# Patient Record
Sex: Male | Born: 1935 | Race: White | Hispanic: No | Marital: Married | Smoking: Former smoker
Health system: Southern US, Community
[De-identification: ages and names within clinical notes are randomized; demographics above are authoritative.]

## PROBLEM LIST (undated history)

## (undated) DIAGNOSIS — I10 Essential (primary) hypertension: Secondary | ICD-10-CM

## (undated) DIAGNOSIS — K5792 Diverticulitis of intestine, part unspecified, without perforation or abscess without bleeding: Secondary | ICD-10-CM

## (undated) DIAGNOSIS — IMO0002 Reserved for concepts with insufficient information to code with codable children: Secondary | ICD-10-CM

## (undated) DIAGNOSIS — E785 Hyperlipidemia, unspecified: Secondary | ICD-10-CM

## (undated) DIAGNOSIS — E119 Type 2 diabetes mellitus without complications: Secondary | ICD-10-CM

---

## 2000-10-25 ENCOUNTER — Encounter: Admission: RE | Admit: 2000-10-25 | Discharge: 2001-01-23 | Payer: Self-pay | Admitting: Internal Medicine

## 2001-10-24 ENCOUNTER — Encounter: Admission: RE | Admit: 2001-10-24 | Discharge: 2001-10-29 | Payer: Self-pay | Admitting: Internal Medicine

## 2002-03-18 ENCOUNTER — Encounter (HOSPITAL_BASED_OUTPATIENT_CLINIC_OR_DEPARTMENT_OTHER): Admission: RE | Admit: 2002-03-18 | Discharge: 2002-03-21 | Payer: Self-pay | Admitting: Internal Medicine

## 2002-07-01 ENCOUNTER — Encounter (HOSPITAL_BASED_OUTPATIENT_CLINIC_OR_DEPARTMENT_OTHER): Admission: RE | Admit: 2002-07-01 | Discharge: 2002-09-29 | Payer: Self-pay | Admitting: Internal Medicine

## 2002-10-03 ENCOUNTER — Encounter (HOSPITAL_BASED_OUTPATIENT_CLINIC_OR_DEPARTMENT_OTHER): Admission: RE | Admit: 2002-10-03 | Discharge: 2003-01-01 | Payer: Self-pay | Admitting: Internal Medicine

## 2002-12-06 ENCOUNTER — Encounter: Payer: Self-pay | Admitting: Internal Medicine

## 2002-12-06 ENCOUNTER — Encounter: Admission: RE | Admit: 2002-12-06 | Discharge: 2002-12-06 | Payer: Self-pay | Admitting: Internal Medicine

## 2003-04-04 ENCOUNTER — Encounter (HOSPITAL_BASED_OUTPATIENT_CLINIC_OR_DEPARTMENT_OTHER): Admission: RE | Admit: 2003-04-04 | Discharge: 2003-07-03 | Payer: Self-pay | Admitting: Internal Medicine

## 2003-07-24 ENCOUNTER — Encounter (HOSPITAL_BASED_OUTPATIENT_CLINIC_OR_DEPARTMENT_OTHER): Admission: RE | Admit: 2003-07-24 | Discharge: 2003-08-06 | Payer: Self-pay | Admitting: Internal Medicine

## 2003-10-30 ENCOUNTER — Encounter (HOSPITAL_BASED_OUTPATIENT_CLINIC_OR_DEPARTMENT_OTHER): Admission: RE | Admit: 2003-10-30 | Discharge: 2003-12-02 | Payer: Self-pay | Admitting: Internal Medicine

## 2004-02-18 ENCOUNTER — Encounter (HOSPITAL_BASED_OUTPATIENT_CLINIC_OR_DEPARTMENT_OTHER): Admission: RE | Admit: 2004-02-18 | Discharge: 2004-02-27 | Payer: Self-pay | Admitting: Internal Medicine

## 2004-05-26 ENCOUNTER — Encounter (HOSPITAL_BASED_OUTPATIENT_CLINIC_OR_DEPARTMENT_OTHER): Admission: RE | Admit: 2004-05-26 | Discharge: 2004-06-09 | Payer: Self-pay | Admitting: Internal Medicine

## 2004-09-07 ENCOUNTER — Encounter (HOSPITAL_BASED_OUTPATIENT_CLINIC_OR_DEPARTMENT_OTHER): Admission: RE | Admit: 2004-09-07 | Discharge: 2004-09-27 | Payer: Self-pay | Admitting: Internal Medicine

## 2005-05-22 ENCOUNTER — Inpatient Hospital Stay (HOSPITAL_COMMUNITY): Admission: EM | Admit: 2005-05-22 | Discharge: 2005-05-25 | Payer: Self-pay | Admitting: Emergency Medicine

## 2009-02-05 ENCOUNTER — Ambulatory Visit: Payer: Self-pay | Admitting: Vascular Surgery

## 2009-02-05 ENCOUNTER — Ambulatory Visit: Admission: RE | Admit: 2009-02-05 | Discharge: 2009-02-05 | Payer: Self-pay | Admitting: Internal Medicine

## 2009-02-05 ENCOUNTER — Encounter (INDEPENDENT_AMBULATORY_CARE_PROVIDER_SITE_OTHER): Payer: Self-pay | Admitting: Internal Medicine

## 2010-10-10 ENCOUNTER — Inpatient Hospital Stay (HOSPITAL_COMMUNITY)
Admission: EM | Admit: 2010-10-10 | Discharge: 2010-10-12 | DRG: 379 | Disposition: A | Discharge status: Home / Self Care | Payer: Medicare Other | Attending: General Practice | Admitting: General Practice

## 2010-10-10 DIAGNOSIS — E119 Type 2 diabetes mellitus without complications: Secondary | ICD-10-CM | POA: Diagnosis present

## 2010-10-10 DIAGNOSIS — E663 Overweight: Secondary | ICD-10-CM | POA: Diagnosis present

## 2010-10-10 DIAGNOSIS — E7800 Pure hypercholesterolemia, unspecified: Secondary | ICD-10-CM | POA: Diagnosis present

## 2010-10-10 DIAGNOSIS — IMO0002 Reserved for concepts with insufficient information to code with codable children: Secondary | ICD-10-CM | POA: Diagnosis present

## 2010-10-10 DIAGNOSIS — H409 Unspecified glaucoma: Secondary | ICD-10-CM | POA: Diagnosis present

## 2010-10-10 DIAGNOSIS — I10 Essential (primary) hypertension: Secondary | ICD-10-CM | POA: Diagnosis present

## 2010-10-10 DIAGNOSIS — K5731 Diverticulosis of large intestine without perforation or abscess with bleeding: Principal | ICD-10-CM | POA: Diagnosis present

## 2010-10-10 LAB — COMPREHENSIVE METABOLIC PANEL
Albumin: 3.3 g/dL — ABNORMAL LOW (ref 3.5–5.2)
Alkaline Phosphatase: 69 U/L (ref 39–117)
BUN: 20 mg/dL (ref 6–23)
CO2: 24 mEq/L (ref 19–32)
Chloride: 105 mEq/L (ref 96–112)
GFR calc non Af Amer: 54 mL/min — ABNORMAL LOW (ref >60)
Glucose, Bld: 121 mg/dL — ABNORMAL HIGH (ref 70–99)
Potassium: 4.6 mEq/L (ref 3.5–5.1)
Total Bilirubin: 0.2 mg/dL — ABNORMAL LOW (ref 0.3–1.2)

## 2010-10-10 LAB — CBC
HCT: 40.8 % (ref 39.0–52.0)
Hemoglobin: 13.4 g/dL (ref 13.0–17.0)
MCV: 95.8 fL (ref 78.0–100.0)
Platelets: 187 10*3/uL (ref 150–400)
RBC: 4.26 MIL/uL (ref 4.22–5.81)
WBC: 10.6 10*3/uL — ABNORMAL HIGH (ref 4.0–10.5)

## 2010-10-10 LAB — DIFFERENTIAL
Basophils Relative: 0 % (ref 0–1)
Eosinophils Relative: 2 % (ref 0–5)
Lymphocytes Relative: 26 % (ref 12–46)
Monocytes Relative: 9 % (ref 3–12)
Neutro Abs: 6.6 10*3/uL (ref 1.7–7.7)
Neutrophils Relative %: 63 % (ref 43–77)

## 2010-10-10 LAB — APTT: aPTT: 37 seconds (ref 24–37)

## 2010-10-10 LAB — TYPE AND SCREEN: Antibody Screen: NEGATIVE

## 2010-10-10 LAB — PROTIME-INR: Prothrombin Time: 13.3 seconds (ref 11.6–15.2)

## 2010-10-11 ENCOUNTER — Emergency Department (HOSPITAL_COMMUNITY): Payer: Medicare Other

## 2010-10-11 LAB — GLUCOSE, CAPILLARY
Glucose-Capillary: 101 mg/dL — ABNORMAL HIGH (ref 70–99)
Glucose-Capillary: 93 mg/dL (ref 70–99)

## 2010-10-11 LAB — HEMOGLOBIN AND HEMATOCRIT, BLOOD
HCT: 35.4 % — ABNORMAL LOW (ref 39.0–52.0)
Hemoglobin: 11.9 g/dL — ABNORMAL LOW (ref 13.0–17.0)

## 2010-10-11 LAB — CBC
HCT: 36.4 % — ABNORMAL LOW (ref 39.0–52.0)
Hemoglobin: 12.1 g/dL — ABNORMAL LOW (ref 13.0–17.0)
MCV: 97.6 fL (ref 78.0–100.0)
RDW: 12.5 % (ref 11.5–15.5)
WBC: 8 10*3/uL (ref 4.0–10.5)

## 2010-10-11 LAB — BASIC METABOLIC PANEL
BUN: 17 mg/dL (ref 6–23)
CO2: 25 mEq/L (ref 19–32)
Chloride: 108 mEq/L (ref 96–112)
GFR calc non Af Amer: >60 mL/min (ref >60)
Glucose, Bld: 104 mg/dL — ABNORMAL HIGH (ref 70–99)
Potassium: 4.4 mEq/L (ref 3.5–5.1)

## 2010-10-11 LAB — ABO/RH: ABO/RH(D): O POS

## 2010-10-12 LAB — HEMOGLOBIN AND HEMATOCRIT, BLOOD
HCT: 32.8 % — ABNORMAL LOW (ref 39.0–52.0)
Hemoglobin: 11 g/dL — ABNORMAL LOW (ref 13.0–17.0)

## 2010-10-12 LAB — GLUCOSE, CAPILLARY
Glucose-Capillary: 87 mg/dL (ref 70–99)
Glucose-Capillary: 93 mg/dL (ref 70–99)
Glucose-Capillary: 141 mg/dL — ABNORMAL HIGH (ref 70–99)

## 2010-10-27 NOTE — Discharge Summary (Addendum)
  NAME:  French, Chad NO.:  [id number]  MEDICAL RECORD NO.:  [id number]           PATIENT TYPE:  I  LOCATION:  4741                         FACILITY:  [facility]  PHYSICIAN:  Deirdre Peer. Zaeda Mcferran, M.D. DATE OF BIRTH:  Aug 10, 1922  DATE OF ADMISSION:  10/10/2010 DATE OF DISCHARGE:  10/12/2010                              DISCHARGE SUMMARY   DISCHARGE DIAGNOSES: 1. Lower gastrointestinal bleed presumed secondary to diverticulosis,     bleeding resolved of conservative measures including bowel rest, IV     fluids.  Discharge hemoglobin 11, admitting hemoglobin 12.  The     patient's drop in hemoglobin felt to be secondary to blood loss as     well as dilutional effect of IV fluids.  The patient was discharged     in stable condition, alert, and oriented tolerating full p.o. 2. Diabetes. 3. Glaucoma. 4. Known history of diverticulosis with gastrointestinal bleeding in     2006. 5. Hypercholesterolemia. 6. Overweight.  DISCHARGE MEDICATIONS:  The patient will resume home medications. Please see medical record for further medication doses.  DISPOSITION:  Discharged home in stable condition, asked to follow up with primary MD in 1 week.  He is also advised to avoid aspirin and NSAIDs.  Past medical history, medications, social history, past surgical history, allergies per admission H and P.  HISTORY OF PRESENT ILLNESS:  An 77 year old male presented to the ED with rectal bleeding.  In the ED, the patient was evaluated.  He was hemodynamically stable.  Admission was deemed necessary for further evaluation and treatment.  Please see dictated H and P for full details.  HOSPITAL COURSE:  The patient was admitted to telemetry floor bed for evaluation and treatment of upper GI bleed.  The patient was treated in typical fashion, bowel rest, ultimately started on clear liquids and serial H and H.  The patient's hemoglobin trended down to 11.  His blood per rectum deceased  ceased.  As he had a colonoscopy in 2016, did not reveal any pathology other than diverticulosis, no repeat colonoscopy was required.  As stated, the patient was started on a regular diet.  He tolerated it well.  He was discharged home in stable condition.  Again, he was asked to avoid aspirin and NSAIDs.  Asked to follow up with primary MD in 1 week.  The patient has several other medical problems, which are stable during this hospitalization.  He was asked to continue his current outpatient meds.     Deirdre Peer. Faraz Ponciano, M.D.     RDP/MEDQ  D:  10/13/2010  T:  10/14/2010  Job:  [id number]  Electronically Signed by Windy Fast Yittel Emrich M.D. on 10/26/2010 05:22:30 PM Electronically Signed by Windy Fast Sadiya Durand M.D. on 10/26/2010 05:22:30 PM Electronically Signed by Windy Fast Enola Siebers M.D. on 10/26/2010 05:36:10 PM Electronically Signed by Windy Fast Shakira Los M.D. on 10/26/2010 06:25:45 PM Electronically Signed by Windy Fast Davyn Elsasser M.D. on 10/26/2010 07:32:31 PM

## 2010-10-28 NOTE — H&P (Signed)
NAME:  Chad French, OBERMAN NO.:  [id number]  MEDICAL RECORD NO.:  [id number]           PATIENT TYPE:  E  LOCATION:  [facility]                         FACILITY:  [facility]  PHYSICIAN:  Lonia Blood, M.D.      DATE OF BIRTH:  [birth year]  DATE OF ADMISSION:  10/10/2010 DATE OF DISCHARGE:                             HISTORY & PHYSICAL   PRIMARY CARE PHYSICIAN:  Deirdre Peer. Polite, MD  CHIEF COMPLAIN:  Rectal bleeding.  HISTORY OF PRESENT ILLNESS:  The patient is an 77 year old male who presents to [facility] for chief complaint of rectal bleeding.  The patient states that his symptoms started approximately 1900 on Sunday evening.  He describes it as a "bloody discharge" from the rectum.  He states that he was feeling quite normal prior to his symptoms starting.  He denies any complaints of abdominal pain, change in stools, nausea, or vomiting.  He denies any change in his diet.  The patient does have a history of GI bleeding in 2006.  The patient was admitted at that time by our service.  On that admission, he had been taking Aleve in addition to his daily aspirin regimen.  He had endoscopy performed by Dr. Randa Evens and he was found to have diverticulosis which was likely to be the source of the bleeding.  It does not appear that the patient had followed up with GI since that colonoscopy.  The patient reports that he was started on meloxicam approximately 3 weeks ago for his significant arthritis.  He states he takes that in addition to his aspirin regimen.  He has not had any problems with this medication until now.  He denies currently any dizziness or lightheadedness.  He denies any problems with standing.  PAST MEDICAL HISTORY: 1. Hypertension. 2. Type 2 diabetes. 3. Degenerative disk disease. 4. Glaucoma bilaterally. 5. Diverticulosis, last colonoscopy in 2006. 6. Known history of GI bleed in 2006. 7. Known hypercholesterolemia. 8. Known  obesity.  PAST SURGICAL HISTORY: 1. Adenoidectomy 2. Tonsillectomy. 3. Hernia repair. 4. Left index finger amputation.  SOCIAL HISTORY:  The patient lives at home normally with his wife. However, his wife is currently in [facility] for a left hip fracture repair.  The patient's eldest daughter, Dia Crawford is healthcare power of attorney.  However, the patient does not have any of those numbers at this time.  The patient denies any alcohol, illicit drug, or tobacco dependence.  MEDICATIONS:  Please see pharmacy medicine reconciliation form.  ALLERGIES:  No known drug allergies.  REVIEW OF SYSTEMS:  GENERAL:  The patient denies any change in weight, fatigue, or weakness.  SKIN:  The patient denies any changes in skin, hair, or nails.  HEENT:  The patient denies any headache, blurriness in vision, tinnitus, rhinorrhea, or hoarseness. CARDIAC:  The patient has a history of hypertension, but denies any angina or palpitations. RESPIRATORY:  The patient denies shortness of breath, wheeze, cough, or sputum. GI:  The patient reports rectal bleeding, but denies any nausea, vomiting, or abdominal pain.  The patient denies any frequency, urgency, or hesitancy. MUSCULOSKELETAL:  The patient denies any muscle weakness,  but has considerable joint pain diffusely. NEUROLOGIC:  The patient denies any loss of sensation, numbness, or tingling. HEMATOLOGIC:  The patient denies history of anemia or easy bruising. ENDOCRINE:  The patient denies heat or cold intolerance. PSYCHIATRIC:  Denies mood changes, anxiety, or depression.  PHYSICAL EXAMINATION:  VITAL SIGNS:  Blood pressure 144/83, heart rate 66, respirations 16, and temperature 98.0 degrees Fahrenheit taken orally. GENERAL:  The patient is a well-appearing, obese white male in no apparent distress. SKIN:  Appears to be intact with no rashes or bruises. HEENT:  Head is normocephalic and atraumatic.  Pupils are equal, round, and  reactive to light.  Ears were normal.  The patient does wear hearing aids.  Nose normal.  Trachea midline. NECK:  Supple. CARDIOVASCULAR:  Normal S1 and S2.  No murmurs, rubs, or gallops. LUNGS:  Clear to auscultation bilaterally.  No wheezes or other adventitious sounds. ABDOMEN:  Quite obese.  Nontender to palpation.  Normoactive bowel sounds heard throughout. GU:  Deferred. MUSCULOSKELETAL:  No signs of muscle atrophy or weakness.  Full joint range of motion of upper and lower extremities. LYMPHATICS:  No lymphadenopathy appreciated. NEUROLOGIC:  The patient is alert and oriented x3.  Cranial nerves II- XII grossly intact.  LABORATORY DATA:  The patient was typed and screened.  The patient is O+.  INR was 0.99.  Prothrombin time 13.3.  CBC showed white blood cell count of 10.6, hemoglobin 13.4, hematocrit 40.8, and platelet count of 187,000.  Sodium 136, potassium 4.6, bicarb 24, glucose 121, BUN 20, creatinine 1.27, calcium 8.5, AST 15, ALT 13.  RADIOLOGY:  No radiographical studies were obtained in the emergency department.  ASSESSMENT AND PLAN: 1. Rectal bleeding, likely lower gastrointestinal source at this time:     The patient describes it as a bright red blood per rectum.  I will     order a clear liquid diet and allow the patient to take his     medications.  We will order IV Protonix.  I defer to the a.m.     provider to call a GI consult in the morning.  The patient appears     to be quite stable at this time.  He has not had any more episodes     of rectal bleeding since arriving in the emergency department at     approximately 2000 this evening.  I suspect that the medication,     meloxicam may be contributing to his GI bleed.  In 2006, it appears     that the use of Aleve in addition to aspirin exacerbated the     symptoms as well.  The patient does have a history of     diverticulosis.  We will discontinue aspirin and meloxicam     currently.  I will obtain  serial H&H q.4 h.  We will admit the     patient to telemetry for at least the first 24 hours.  The patient     appears quite stable at this time. 2. Hypertension:  The patient is quite stable.  It appears the patient     sees his primary care provider on a regular basis.  I will continue     his home medicines.  I will provide a small amount of IV fluids as     the patient is not taking much p.o. at this time.  The patient has     had recent stress test from what I can infer from interview.  I do     not have records of this.  There are no signs of fluid overload or     heart failure.  I will obtain a chest x-ray to further evaluate for     this.  We will closely monitor his blood pressure. 3. Type 2 diabetes:  This does appear to be quite well controlled on 1     oral agent.  However, with the patient being on clear     liquids/n.p.o., we will change over to sliding-scale insulin on     sensitive.  I will obtain CBGs q.4 h.  He will likely be able to go     back to his home medications once he resumes a normal diet. 4. Degenerative disk disease:  It appears that when the patient starts     a new medication for his significant arthritis this exacerbates his     any GI problems.  Currently, I will prescribe Tylenol while the     patient is an inpatient here. 5. Hyperlipidemia:  It appears the patient only takes fish oil over     the counter for this.  I will hold this currently and he may resume     after further gastroenterology evaluation. 6. Venous thromboembolism prophylaxis:  SCDs. 7. The patient is a full code.    ______________________________ Arlyn Leak, PA-C   ______________________________ Lonia Blood, M.D.    JH/MEDQ  D:  10/11/2010  T:  10/11/2010  Job:  [id number]  cc:   Deirdre Peer. Polite, M.D.  Electronically Signed by Arlyn Leak PA on 10/17/2010 01:39:16 AM Electronically Signed by Lonia Blood M.D. on 10/27/2010 04:22:34 PM

## 2010-11-21 HISTORY — PX: COLONOSCOPY: SHX174

## 2010-11-26 ENCOUNTER — Inpatient Hospital Stay (HOSPITAL_COMMUNITY)
Admission: EM | Admit: 2010-11-26 | Discharge: 2010-11-28 | DRG: 378 | Disposition: A | Discharge status: Home / Self Care | Payer: Medicare Other | Attending: Internal Medicine | Admitting: Internal Medicine

## 2010-11-26 DIAGNOSIS — I951 Orthostatic hypotension: Secondary | ICD-10-CM | POA: Diagnosis present

## 2010-11-26 DIAGNOSIS — I10 Essential (primary) hypertension: Secondary | ICD-10-CM | POA: Diagnosis present

## 2010-11-26 DIAGNOSIS — E785 Hyperlipidemia, unspecified: Secondary | ICD-10-CM | POA: Diagnosis present

## 2010-11-26 DIAGNOSIS — K625 Hemorrhage of anus and rectum: Principal | ICD-10-CM | POA: Diagnosis present

## 2010-11-26 DIAGNOSIS — D62 Acute posthemorrhagic anemia: Secondary | ICD-10-CM | POA: Diagnosis present

## 2010-11-26 DIAGNOSIS — E119 Type 2 diabetes mellitus without complications: Secondary | ICD-10-CM | POA: Diagnosis present

## 2010-11-26 DIAGNOSIS — H409 Unspecified glaucoma: Secondary | ICD-10-CM | POA: Diagnosis present

## 2010-11-26 DIAGNOSIS — Z7982 Long term (current) use of aspirin: Secondary | ICD-10-CM

## 2010-11-26 DIAGNOSIS — IMO0002 Reserved for concepts with insufficient information to code with codable children: Secondary | ICD-10-CM | POA: Diagnosis present

## 2010-11-26 DIAGNOSIS — K5730 Diverticulosis of large intestine without perforation or abscess without bleeding: Secondary | ICD-10-CM | POA: Diagnosis present

## 2010-11-26 DIAGNOSIS — Z79899 Other long term (current) drug therapy: Secondary | ICD-10-CM

## 2010-11-26 DIAGNOSIS — Z791 Long term (current) use of non-steroidal anti-inflammatories (NSAID): Secondary | ICD-10-CM

## 2010-11-26 LAB — CBC
HCT: 30.1 % — ABNORMAL LOW (ref 39.0–52.0)
HCT: 28.6 % — ABNORMAL LOW (ref 39.0–52.0)
Hemoglobin: 10.5 g/dL — ABNORMAL LOW (ref 13.0–17.0)
Hemoglobin: 9.7 g/dL — ABNORMAL LOW (ref 13.0–17.0)
Hemoglobin: 9.3 g/dL — ABNORMAL LOW (ref 13.0–17.0)
MCH: 32.4 pg (ref 26.0–34.0)
MCH: 31.4 pg (ref 26.0–34.0)
MCH: 32 pg (ref 26.0–34.0)
MCHC: 32.5 g/dL (ref 30.0–36.0)
MCHC: 31.4 g/dL (ref 30.0–36.0)
MCHC: 32.2 g/dL (ref 30.0–36.0)
MCV: 99.7 fL (ref 78.0–100.0)
Platelets: 181 10*3/uL (ref 150–400)
WBC: 9.4 10*3/uL (ref 4.0–10.5)

## 2010-11-26 LAB — GLUCOSE, CAPILLARY
Glucose-Capillary: 115 mg/dL — ABNORMAL HIGH (ref 70–99)
Glucose-Capillary: 105 mg/dL — ABNORMAL HIGH (ref 70–99)
Glucose-Capillary: 94 mg/dL (ref 70–99)

## 2010-11-26 LAB — BASIC METABOLIC PANEL
BUN: 22 mg/dL (ref 6–23)
Chloride: 109 mEq/L (ref 96–112)
GFR calc non Af Amer: >60 mL/min (ref >60)
Glucose, Bld: 168 mg/dL — ABNORMAL HIGH (ref 70–99)
Potassium: 4.7 mEq/L (ref 3.5–5.1)
Sodium: 141 mEq/L (ref 135–145)

## 2010-11-26 LAB — OCCULT BLOOD, POC DEVICE: Fecal Occult Bld: POSITIVE

## 2010-11-26 LAB — TYPE AND SCREEN: Antibody Screen: NEGATIVE

## 2010-11-26 LAB — DIFFERENTIAL
Basophils Absolute: 0 10*3/uL (ref 0.0–0.1)
Lymphs Abs: 2 10*3/uL (ref 0.7–4.0)
Monocytes Absolute: 0.8 10*3/uL (ref 0.1–1.0)
Neutro Abs: 7.3 10*3/uL (ref 1.7–7.7)

## 2010-11-26 LAB — PROTIME-INR: Prothrombin Time: 13.4 seconds (ref 11.6–15.2)

## 2010-11-27 LAB — COMPREHENSIVE METABOLIC PANEL
AST: 13 U/L (ref 0–37)
BUN: 12 mg/dL (ref 6–23)
CO2: 24 mEq/L (ref 19–32)
Chloride: 111 mEq/L (ref 96–112)
Creatinine, Ser: 0.93 mg/dL (ref 0.4–1.5)
GFR calc non Af Amer: >60 mL/min (ref >60)
Total Bilirubin: 0.7 mg/dL (ref 0.3–1.2)

## 2010-11-27 LAB — CBC
HCT: 27.8 % — ABNORMAL LOW (ref 39.0–52.0)
Hemoglobin: 9 g/dL — ABNORMAL LOW (ref 13.0–17.0)
MCH: 31.9 pg (ref 26.0–34.0)
MCV: 98.6 fL (ref 78.0–100.0)
RBC: 2.82 MIL/uL — ABNORMAL LOW (ref 4.22–5.81)

## 2010-11-27 LAB — GLUCOSE, CAPILLARY: Glucose-Capillary: 124 mg/dL — ABNORMAL HIGH (ref 70–99)

## 2010-11-27 LAB — ABO/RH: ABO/RH(D): O POS

## 2010-11-28 LAB — CBC
MCH: 32 pg (ref 26.0–34.0)
Platelets: 168 10*3/uL (ref 150–400)
RBC: 2.69 MIL/uL — ABNORMAL LOW (ref 4.22–5.81)
RDW: 12.9 % (ref 11.5–15.5)

## 2010-11-28 LAB — GLUCOSE, CAPILLARY
Glucose-Capillary: 136 mg/dL — ABNORMAL HIGH (ref 70–99)
Glucose-Capillary: 114 mg/dL — ABNORMAL HIGH (ref 70–99)

## 2010-11-30 NOTE — Discharge Summary (Signed)
  NAME:  Chad French, Chad French NO.:  [id number]  MEDICAL RECORD NO.:  [id number]           PATIENT TYPE:  I  LOCATION:  1401                         FACILITY:  [facility]  PHYSICIAN:  Thora Lance, M.D.  DATE OF BIRTH:  February 22, 1922  DATE OF ADMISSION:  11/26/2010 DATE OF DISCHARGE:  11/28/2010                              DISCHARGE SUMMARY   REASON FOR ADMISSION:  An 77 year old male with a known history of diverticulosis and apparent diverticular bleed in February 2012, who presented again with bright red blood per rectum without abdominal pain. He was found to be orthostatic in the emergency room and was admitted for further management.  SIGNIFICANT FINDINGS:  VITAL SIGNS:  Temperature 97.4, blood pressure 125/77, heart rate 98, respirations 18, oxygen saturation 98% on room air. LUNGS:  Clear. HEART:  Regular rate and rhythm. ABDOMEN:  Soft and nontender.  LABORATORY DATA:  WBC 10.3, hemoglobin 11.6, platelet count 181.  Sodium 141, potassium 4.7, chloride 109, bicarbonate 23, glucose 150, BUN 22, creatinine 1.13, calcium 8.9, INR 1.0.  HOSPITAL COURSE:  The patient was admitted for a lower gastrointestinal bleed.  Aspirin and all nonsteroidals were stopped.  He was given IV fluids.  His hemoglobin did drop and eventually stabilized at about 9 g; at discharge, it was 8.6.  The patient had no further episodes of bright red blood per rectum during the hospitalization.  His vital signs remained stable.  He ambulated on his own prior to discharge without any difficulty.  He was taking regular diet at discharge.  He was discharged in good condition off all nonsteroidal agents.  He was started on a short course of iron.  DISCHARGE DIAGNOSES: 1. Lower gastrointestinal bleed. 2. Anemia, related to blood loss. 3. Diverticulosis. 4. Type 2 diabetes. 5. Hypertension. 6. Morbid obesity. 7. Degenerative disk disease. 8. Dyslipidemia. 9. Glaucoma.  PROCEDURES:   None.  DISCHARGE MEDICATIONS: 1. Brimonidine ophthalmic solution 0.2% one drop in right eye twice a     day. 2. Iron sulfate 325 mg once by mouth daily for 2 weeks. 3. Fish oil 1000 mg once a day. 4. Glyburide 3 mg 1 tablet once a day. 5. Ketorolac ophthalmic 0.5% one drop in left eye twice a day. 6. Lisinopril 40 mg 1 tablet once a day. 7. Metoprolol 50 mg one-half tablet q.p.m. 8. Timolol ophthalmic solution 0.5% one drop in both eyes twice a day. 9. Vitamin B 1500 mcg 1 tablet by mouth daily. 10.Aspirin discontinued.  Off all nonsteroidal agents.  DISPOSITION:  Discharged to home.  FOLLOWUP:  One week, Dr. Susy Manor.  ACTIVITY:  As tolerated.  DIET:  Carbohydrate-modified, diabetic diet.  CODE STATUS:  Full code.          ______________________________ Thora Lance, M.D.     JJG/MEDQ  D:  11/28/2010  T:  11/28/2010  Job:  [id number]  cc:   Deirdre Peer. Polite, M.D.  Electronically Signed by Kirby Funk M.D. on 11/30/2010 06:08:19 PM

## 2010-12-01 ENCOUNTER — Inpatient Hospital Stay (HOSPITAL_COMMUNITY)
Admission: EM | Admit: 2010-12-01 | Discharge: 2010-12-04 | DRG: 379 | Disposition: A | Discharge status: Home / Self Care | Payer: Medicare Other | Attending: Internal Medicine | Admitting: Internal Medicine

## 2010-12-01 ENCOUNTER — Inpatient Hospital Stay (HOSPITAL_COMMUNITY): Payer: Medicare Other

## 2010-12-01 DIAGNOSIS — D649 Anemia, unspecified: Secondary | ICD-10-CM | POA: Diagnosis present

## 2010-12-01 DIAGNOSIS — K648 Other hemorrhoids: Secondary | ICD-10-CM | POA: Diagnosis present

## 2010-12-01 DIAGNOSIS — E119 Type 2 diabetes mellitus without complications: Secondary | ICD-10-CM | POA: Diagnosis present

## 2010-12-01 DIAGNOSIS — E785 Hyperlipidemia, unspecified: Secondary | ICD-10-CM | POA: Diagnosis present

## 2010-12-01 DIAGNOSIS — K5731 Diverticulosis of large intestine without perforation or abscess with bleeding: Principal | ICD-10-CM | POA: Diagnosis present

## 2010-12-01 DIAGNOSIS — D126 Benign neoplasm of colon, unspecified: Secondary | ICD-10-CM | POA: Diagnosis present

## 2010-12-01 LAB — URINALYSIS, MICROSCOPIC ONLY
Ketones, ur: NEGATIVE mg/dL
Leukocytes, UA: NEGATIVE
Nitrite: NEGATIVE
Specific Gravity, Urine: 1.016 (ref 1.005–1.030)
Urobilinogen, UA: 0.2 mg/dL (ref 0.0–1.0)
pH: 5 (ref 5.0–8.0)

## 2010-12-01 LAB — POCT I-STAT, CHEM 8
Calcium, Ion: 1.23 mmol/L (ref 1.12–1.32)
Chloride: 108 mEq/L (ref 96–112)
HCT: 23 % — ABNORMAL LOW (ref 39.0–52.0)
Hemoglobin: 7.8 g/dL — ABNORMAL LOW (ref 13.0–17.0)
TCO2: 24 mmol/L (ref 0–100)

## 2010-12-01 LAB — CBC
MCH: 31.7 pg (ref 26.0–34.0)
MCHC: 32.4 g/dL (ref 30.0–36.0)
MCV: 97.7 fL (ref 78.0–100.0)
Platelets: 205 10*3/uL (ref 150–400)
RDW: 13.2 % (ref 11.5–15.5)
WBC: 6.7 10*3/uL (ref 4.0–10.5)

## 2010-12-01 LAB — GLUCOSE, CAPILLARY
Glucose-Capillary: 116 mg/dL — ABNORMAL HIGH (ref 70–99)
Glucose-Capillary: 128 mg/dL — ABNORMAL HIGH (ref 70–99)

## 2010-12-01 LAB — PROTIME-INR: Prothrombin Time: 13.7 seconds (ref 11.6–15.2)

## 2010-12-02 LAB — GLUCOSE, CAPILLARY
Glucose-Capillary: 100 mg/dL — ABNORMAL HIGH (ref 70–99)
Glucose-Capillary: 128 mg/dL — ABNORMAL HIGH (ref 70–99)
Glucose-Capillary: 129 mg/dL — ABNORMAL HIGH (ref 70–99)
Glucose-Capillary: 131 mg/dL — ABNORMAL HIGH (ref 70–99)

## 2010-12-02 LAB — TYPE AND SCREEN: Unit division: 0

## 2010-12-02 LAB — BASIC METABOLIC PANEL
CO2: 24 mEq/L (ref 19–32)
Calcium: 8.4 mg/dL (ref 8.4–10.5)
GFR calc Af Amer: >60 mL/min (ref >60)
GFR calc non Af Amer: >60 mL/min (ref >60)
Potassium: 3.7 mEq/L (ref 3.5–5.1)
Sodium: 140 mEq/L (ref 135–145)

## 2010-12-02 LAB — CBC
HCT: 30.9 % — ABNORMAL LOW (ref 39.0–52.0)
Hemoglobin: 10.4 g/dL — ABNORMAL LOW (ref 13.0–17.0)
Hemoglobin: 10.6 g/dL — ABNORMAL LOW (ref 13.0–17.0)
MCH: 30.9 pg (ref 26.0–34.0)
MCHC: 33.6 g/dL (ref 30.0–36.0)
MCHC: 33.8 g/dL (ref 30.0–36.0)
MCV: 92.1 fL (ref 78.0–100.0)
Platelets: 183 10*3/uL (ref 150–400)
Platelets: 195 10*3/uL (ref 150–400)
RBC: 3.33 MIL/uL — ABNORMAL LOW (ref 4.22–5.81)
RBC: 3.37 MIL/uL — ABNORMAL LOW (ref 4.22–5.81)
RBC: 3.43 MIL/uL — ABNORMAL LOW (ref 4.22–5.81)
RDW: 15.3 % (ref 11.5–15.5)
RDW: 15.3 % (ref 11.5–15.5)
WBC: 8.4 10*3/uL (ref 4.0–10.5)
WBC: 9.4 10*3/uL (ref 4.0–10.5)

## 2010-12-02 NOTE — H&P (Signed)
NAME:  Chad, French NO.:  [id number]  MEDICAL RECORD NO.:  [id number]           PATIENT TYPE:  E  LOCATION:  [facility]                         FACILITY:  [facility]  PHYSICIAN:  Conley Canal, MD      DATE OF BIRTH:  Dec 18, 1921  DATE OF ADMISSION:  12/01/2010 DATE OF DISCHARGE:                             HISTORY & PHYSICAL   PRIMARY CARE PHYSICIAN:  Deirdre Peer. Polite, MD  GASTROENTEROLOGIST:  Everardo All. Madilyn Fireman, MD  CHIEF COMPLAINT:  Rectal bleeding since this morning.  HISTORY OF PRESENT ILLNESS:  Chad French is a pleasant 77 year old male with history of diabetes mellitus type 2, hypertension, morbid obesity, hyperlipidemia, degenerative disk disease, glaucoma, diverticulosis with recurrent bleed, last colonoscopy in 2006 who was admitted to the hospital on November 26, 2010, with rectal bleeding, which was self limiting and was discharged on November 28, 2010; however, he noticed that this morning he had 2 large bowel movements mixed with dark colored blood.  The patient says that he has been off aspirin and denies any abdominal or rectal pain.  His hemoglobin on admission was 7.8 by chemistry and CBC showed hemoglobin of 8.4 and there was no evidence of coagulopathy.  He has already been seen by Gastroenterology evaluation appreciated and he will be prepped for colonoscopy for tomorrow by Gastroenterology.  Otherwise, he denies any other symptomatology. Denies shortness of breath.  No palpitations.  No dizziness.  PAST MEDICAL HISTORY: 1. Diabetes mellitus type 2. 2. Hypertension. 3. Obesity. 4. Hyperlipidemia. 5. Glaucoma. 6. Degenerative disk disease. 7. Obesity.  ALLERGIES:  No known drug allergies.  SOCIAL HISTORY:  The patient is married.  Denies cigarette smoking, alcohol, or illicit drugs.  His wife is currently in the hospital for orthopedic issues.  PHYSICAL EXAMINATION:  HEENT:  Pupils equal and reacting to light. NECK:  No jugular venous  distention.  No carotid bruits. LUNGS:  Bilateral air entry with no rhonchi, rales, or wheezes. CARDIOVASCULAR:  LABORATORY DATA:  Significant for WBC 6.7, hemoglobin 8.4, hematocrit 25.9, platelet count 205.  Sodium 145, potassium 3.9.  coagulation profile normal.  No imaging studies at present.  IMPRESSION:  Recurrent painless rectal bleeding probably secondary to diverticular bleeding versus other etiologies including ischemic colitis.  The patient hemodynamically stable, but seems like hematocrit tending to lower than his recent baseline of around 25 in the last admission. 1. Acute recurrent rectal bleeding.  Gastroenterology appreciated.  We     will admit the patient to telemetry, transfuse 2 units PRBC,     monitor hemoglobin/hematocrit q.8 hourly, target hematocrit at     least 25.  The patient will be on Protonix.  Avoid nonsteroidal     anti-inflammatory drugs.  Plans for colonoscopy by     Gastroenterology. 2. Hypertension.  Lopressor and lisinopril at home.  Will resume     Lopressor for now. 3. Diabetes mellitus type 2.  Will hold oral hypoglycemics and place     the patient on sliding scale insulin for now until GI issues     addressed. 4. Morbid obesity.  Encourage lifestyle changes. 5. Hyperlipidemia.  Resume home medications once acute GI issues  addressed. 6. Degenerative joint disease.  Pain meds as necessary. 7. The patient states condition is closely guarded.  He will be     followed by Dr. Renford Dills in a.m. on December 02, 2010.     Conley Canal, MD     SR/MEDQ  D:  12/01/2010  T:  12/01/2010  Job:  [id number]  cc:   Deirdre Peer. Polite, M.D.  James L. Malon Kindle., M.D. Fax: [phone]  Everardo All. Madilyn Fireman, M.D. Fax: [phone]  Electronically Signed by Conley Canal  on 12/02/2010 09:58:45 AM

## 2010-12-03 ENCOUNTER — Other Ambulatory Visit: Payer: Self-pay | Admitting: Gastroenterology

## 2010-12-03 LAB — CBC
MCH: 30.7 pg (ref 26.0–34.0)
MCHC: 33 g/dL (ref 30.0–36.0)
Platelets: 184 10*3/uL (ref 150–400)

## 2010-12-03 LAB — GLUCOSE, CAPILLARY
Glucose-Capillary: 118 mg/dL — ABNORMAL HIGH (ref 70–99)
Glucose-Capillary: 117 mg/dL — ABNORMAL HIGH (ref 70–99)

## 2010-12-04 DIAGNOSIS — K922 Gastrointestinal hemorrhage, unspecified: Secondary | ICD-10-CM

## 2010-12-04 DIAGNOSIS — D649 Anemia, unspecified: Secondary | ICD-10-CM

## 2010-12-07 NOTE — H&P (Signed)
NAME:  Chad French, Chad French NO.:  [id number]  MEDICAL RECORD NO.:  [id number]           PATIENT TYPE:  E  LOCATION:  [facility]LED                         FACILITY:  [facility]  PHYSICIAN:  Lonia Blood, M.D.      DATE OF BIRTH:  1922-04-20  DATE OF ADMISSION:  11/26/2010 DATE OF DISCHARGE:                             HISTORY & PHYSICAL   PRIMARY CARE PHYSICIAN:  Deirdre Peer. Polite, M.D.  PRESENTING COMPLAINT:  Rectal bleed.  HISTORY OF PRESENT ILLNESS:  The patient is an 77 year old gentleman with known history of diverticulosis and apparent diverticular bleed. He was last admitted on October 11, 2010, with rectal bleeding.  He was evaluated at that time and discharged home on new regimen of medication, which included holding his aspirin and meloxicam for a while.  The patient has been doing fine afterwards even when those medications were resumed.  Tonight, however, he experienced painless rectal bleed, which has persisted.  He has had at least 6 bowel movements so far, 4 at home and 2 here in the ED.  He denied any abdominal pain.  No nausea or vomiting.  The patient described his bleeding as "filling the toilet with the blood."  He was found to be orthostatic in the ED.  Hence, he is being admitted for further management.  PAST MEDICAL HISTORY:  Significant for: 1. Diverticulosis with recurrent rectal bleed. 2. Type 2 diabetes. 3. Hypertension. 4. Morbid obesity. 5. Degenerative disk disease. 6. Dyslipidemia. 7. Glaucoma.  ALLERGIES:  He has no known drug allergies.  CURRENT MEDICATIONS: 1. Brimonidine eyedrops 0.2% each eye twice a day. 2. Glyburide 3 mg daily. 3. Lisinopril 40 mg daily. 4. Metoprolol 50 mg b.i.d. 5. Meloxicam 15 mg daily. 6. Multivitamin one tablet a day. 7. Vitamin D 1500 mcg daily. 8. Fish oil 1000 mg daily. 9. Timolol eyedrops 1 drop each eye twice a day. 10.Ketorolac 0.5% also both eyes twice a day.  SOCIAL HISTORY:  The patient is  married, lives with his wife who also has a lot of medical issues.  His wife is currently over here at [place] apparently for another surgery in the morning.  His elder daughter is the healthcare power of attorney.  No alcohol.  No tobacco.  No IV drug use.  FAMILY HISTORY:  Nonsignificant.  PAST SURGICAL HISTORY: 1. Status post adenectomy. 2. Status post tonsillectomy. 3. Status post hernia repair. 4. Status post left index finger amputation.  REVIEW OF SYSTEMS:  All systems reviewed are currently negative except for HPI.  PHYSICAL EXAMINATION:  VITAL SIGNS:  Temperature is 97.4, initial blood pressure 125/77 with a pulse of 98, respiratory rate 18, saturating 98% on room air.  The patient was found to be orthostatic with his systolic dropping from 119 to 97 laying to sitting and then dropping to 81 standing.  His pulse went from 88 to 104 from lying to standing. GENERAL:  He is awake, alert, oriented, pleasant man.  He is in no acute distress. HEENT:  PERRL.  EOMI.  No pallor, no jaundice.  No rhinorrhea. NECK:  Supple.  No JVD, no lymphadenopathy. RESPIRATORY:  The patient has good air  entry bilaterally.  No wheezes or rales.  No crackles. CARDIOVASCULAR:  He has S1 and S2.  No murmur. ABDOMEN:  Distended, soft, nontender with positive bowel sounds. RECTAL:  Grossly bloody stool, which is guaiac positive. EXTREMITIES:  No edema, cyanosis or clubbing.  LABORATORY DATA:  White count is 10.3, hemoglobin 11.6, platelet count of 181.  Sodium is 141, potassium 4.7, chloride 109, CO2 of 23, glucose 160, BUN 22, creatinine 1.13 and calcium 8.9.  His PT is 13.4 and INR 1.0.  ASSESSMENT:  This is an 77 year old gentleman with known history of diverticular bleed, here with what appears to be another episode of diverticular bleed.  The patient is having painless bleed.  He is also orthostatic at this point, although his hemoglobin is yet to adjust and is still high at around  11.  PLAN: 1. Gastrointestinal bleed, more than likely this is diverticular     bleed.  We will admit the patient to monitored bed.  Serial CBCs,     IV fluids, type and cross matching for 2 units of packed red blood     cells and hold.  We will transfuse those if necessary.  He will get     GI consult.  I will hold his NSAIDS and his aspirin for now.  I     will give IV Protonix.  We will get further decisions to be made.     At this age, the patient is probably not a candidate for surgical     removal, but the frequency at which he is having the bleeds may     necessitate discontinuing all NSAIDs, aspirin and further decision     on what to do in the future.  At this point, he looks stable.  We     will hydrate him once he is less orthostatic and once his     hemoglobin stabilizes and the bleeding subsides, he may be able to     go home with conservative management.  His gastroenterologist, Dr.     Randa Evens will also be involved in his care. 2. Diabetes.  I will put the patient on clear liquids at this point.     I will put him on sliding scale insulin for now until a decision is     made on what to do. 3. Hypertension.  Again, I will hold antihypertensives in the setting     of his orthostasis at this point. 4. Dyslipidemia.  I am holding his fish oil here in the hospital as     well. 5. Degenerative disk disease.  We will continue his pain control as     necessary.  Further treatment will depend on the patient's response     to these initial measures.     Lonia Blood, M.D.     Verlin Grills  D:  11/26/2010  T:  11/26/2010  Job:  [id number]  Electronically Signed by Lonia Blood M.D. on 12/07/2010 04:04:59 PM

## 2010-12-12 NOTE — Discharge Summary (Signed)
NAME:  Chad French, Chad French         ACCOUNT NO.:  [id number]  MEDICAL RECORD NO.:  [id number]           PATIENT TYPE:  I  LOCATION:  1440                         FACILITY:  [facility]  PHYSICIAN:  Rosalyn Gess. Kamarrion Stfort, MD  DATE OF BIRTH:  August 17, 1922  DATE OF ADMISSION:  12/01/2010 DATE OF DISCHARGE:  12/04/2010                              DISCHARGE SUMMARY   ADMITTING DIAGNOSIS:  Lower gastrointestinal bleed with anemia.  DISCHARGE DIAGNOSIS:  Lower gastrointestinal bleed with anemia.  CONSULTANT:  Dr. Everardo All.  Madilyn Fireman of Gastroenterology.  PROCEDURES: 1. Chest x-ray on the day of admission which showed mild cardiomegaly,     no active lung disease. 2. Colonoscopy performed on December 03, 2010, which revealed left     greater than right diverticulosis with an excellent prep, one small     descending colon polyp was hot biopsied.  HISTORY OF PRESENT ILLNESS:  The patient is an 77 year old Caucasian gentleman who had been discharged on November 28, 2010, after lower GI bleed thought secondary to diverticulosis.  He had had a previous admission in February for same symptoms and also in 2006.  He did have colonoscopy in 2006 which revealed many left-sided diverticular lesions.  The patient reports he has not had any pain with these episodes of bleeding.  He had been on aspirin but this has been held since his admission in February. The patient denied any orthostatic dizziness.  At this admission, his hemoglobin was 8.4 g, down from his last hemoglobin of 11.6.  Please see the H and P for the patient's past medical history, family history, and social history.  Physical exams also well documented.  HOSPITAL COURSE:  The patient was admitted to a telemetry unit.  The patient was transfused 2 units of packed red cells with good response with his hemoglobin rising to 10.1 g and this remained stable on 2 subsequent studies.  The patient did have colonoscopy as noted.  The patient did not have any  recurrent bleeding since being admitted.  He feels well, denying any chest pain, chest discomfort or shortness of breath.  With the patient having no recurrent active bleeding with colonoscopy being complete with the patient having received 2 units of packed cells and hemoglobin remaining stable, at this point he is ready for discharge to home.  DISCHARGE EXAMINATION:  VITAL SIGNS:  Temperature of 97.8, blood pressure 116/66, heart rate 75, respirations 18. GENERAL APPEARANCE:  A pleasant gentleman looking younger than his stated chronologic age of 14, in no acute distress. HEENT:  Exam was unremarkable.  Conjunctivae and sclerae being clear. CHEST:  The patient is moving air well with no rales, wheezes or rhonchi. CARDIOVASCULAR:  A 2+ radial pulses.  Precordium is quiet.  He had a regular rate and rhythm. ABDOMEN:  Protuberant, soft with positive bowel sounds.  No tenderness to palpation.  No further examination conducted.  FINAL LABORATORY DATA:  Hemoglobin at discharge was 10.4 g.  His serial hemoglobins were 7.8 at time of admission, after transfusion to 10.6, then 10.3, then 10.1, then 10.4.  Additional lab results; metabolic panel from December 02, 2010, with a sodium of 140, potassium  3.7, chloride 109, CO2 24, BUN of 9, creatinine 0.98, glucose was 91, GFR estimated at greater than 60, calcium was 8.4.  DISPOSITION:  The patient is discharged home.  Please see his medication discharge manager for medications.  He will be continued on all his home medications except for meloxicam and aspirin.  DISPOSITION:  He is to be discharged home.  He has an appointment already scheduled with Dr. Deirdre Peer. Polite for Wednesday December 08, 2010, which she will keep.  At that point, he will should probably have a CBC to monitor his hemoglobin.  CONDITION ON DISCHARGE:  The patient's condition at time of discharge dictation is stable and improved.     Rosalyn Gess Malakhai Beitler,  MD     MEN/MEDQ  D:  12/04/2010  T:  12/04/2010  Job:  [id number]  cc:   Deirdre Peer. Polite, M.D.  Sigmund I. Patsi Sears, M.D. Fax: [phone]  Electronically Signed by Illene Regulus MD on 12/12/2010 05:46:10 PM

## 2011-01-05 NOTE — Op Note (Signed)
  NAME:  Chad French, Chad French         ACCOUNT NO.:  [id number]  MEDICAL RECORD NO.:  [id number]           PATIENT TYPE:  I  LOCATION:  1440                         FACILITY:  [facility]  PHYSICIAN:  Jermond Burkemper C. Madilyn Fireman, M.D.    DATE OF BIRTH:  [birth year]  DATE OF PROCEDURE: DATE OF DISCHARGE:                              OPERATIVE REPORT   INDICATIONS FOR PROCEDURE:  Recurrent rectal bleeding, suspect lower GI source.  PROCEDURE:  The patient was placed in the left lateral decubitus position and placed on the pulse monitor with continuous low-flow oxygen delivered by nasal cannula.  He was sedated with 50 mcg IV fentanyl and 5 mg IV Versed.  Olympus video colonoscope was inserted into the rectum and advanced to cecum, confirmed by transillumination of McBurney's point and visualization of ileocecal valve and appendiceal orifice. Prep was good.  The cecum and ascending colon appeared normal.  There were, maybe, one or two diverticula in the ascending colon.  There were few scattered diverticula in the transverse colon as well and this became more numerous in and the descending and sigmoid colon. There were no specific stigma of hemorrhage associated with any visualized diverticula and no old or fresh blood seen.  There were no mucosal abnormalities except for there was a small descending colon polyp at 50 cm, it was fulgurated by hot biopsy.  Other than the diverticula, no other abnormalities were seen.  The rectum appeared normal. Retroflexed view of the anus showing small internal hemorrhoids.  The scope was then withdrawn and the patient returned to the recovery room in stable condition.  He tolerated the procedure well.  There were no immediate complications.  IMPRESSION: 1. Diverticulosis, left greater than right. 2. Small ascending or descending colon polyp. 3. Small internal hemorrhoids. 4. No active bleeding.  PLAN:  Monitor stools and hemoglobin if rebleeds. Attempt  acute localization and may need hemicolectomy if bleed can be localized.          ______________________________ Everardo All Madilyn Fireman, M.D.     JCH/MEDQ  D:  12/03/2010  T:  12/03/2010  Job:  [id number]  Electronically Signed by Dorena Cookey M.D. on 01/05/2011 08:52:55 PM

## 2011-01-05 NOTE — Consult Note (Signed)
  NAME:  Chad French, Chad French NO.:  [id number]  MEDICAL RECORD NO.:  [id number]           PATIENT TYPE:  E  LOCATION:  WLED                         FACILITY:  [facility]  PHYSICIAN:  Allisson Schindel C. Madilyn Fireman, M.D.    DATE OF BIRTH:  08-06-1922  DATE OF CONSULTATION:  12/01/2010 DATE OF DISCHARGE:                                CONSULTATION   REASON FOR CONSULTATION:  Lower gastrointestinal bleed.  HISTORY OF PRESENT ILLNESS:  The patient is a 77 year old white male recently discharged from the hospital on November 28, 2010, after a lower GI bleed presumed secondary to diverticulosis.  He had a previous admission in February and in 2006 and at that time, had a colonoscopy which showed mainly left-sided diverticulosis.  He has not had any pain with any of these episodes and no melena or hematochezia.  He was on an aspirin a day but this has been held at least from the admission in February.  He denies any orthostatic dizziness.  He does not believe he has had a colonoscopy since 2006.  His hemoglobin on this admission was 8.4 which is down from 11.6 when he was admitted last time.  When he went home, his hemoglobin was 8.6.  PAST MEDICAL HISTORY: 1. Type 2 diabetes. 2. Hypertension. 3. Obesity. 4. Degenerative disk disease. 5. Dyslipidemia. 6. Glaucoma.  ALLERGIES:  None known.  MEDICATIONS: 1. Lisinopril. 2. Metoprolol. 3. Meloxicam. 4. Glyburide. 5. Timolol eyedrops.  SOCIAL HISTORY:  The patient is married.  He denies alcohol or tobacco use.  PAST SURGICAL HISTORY: 1. Tonsillectomy and adenoidectomy. 2. Hernia repair.  PHYSICAL EXAMINATION:  GENERAL:  Obese, somewhat pale white male in no acute distress.  Alert, oriented x4. HEART:  Regular rate and rhythm without murmur. ABDOMEN:  Soft, distended with normoactive bowel sounds.  No hepatosplenomegaly, mass, or guarding.  LABORATORY DATA:  Hemoglobin 8.4, platelets 205,000, WBC 6700.  PT 13.7, INR 1.03, BUN 15,  and creatinine 1.1.  IMPRESSION:  Recurrent lower gastrointestinal bleeding, suspect diverticular.  No colonoscopy since 2006 that I can tell.  PLAN:  Will probably need transfusion this admission and we will plan on colonoscopy after stabilization and prep for colonoscopy tomorrow.          ______________________________ Everardo All Madilyn Fireman, M.D.     JCH/MEDQ  D:  12/01/2010  T:  12/01/2010  Job:  [id number]  Electronically Signed by Dorena Cookey M.D. on 01/05/2011 08:52:50 PM

## 2011-01-07 NOTE — H&P (Signed)
NAME:  Chad French, Chad French NO.:  [id number]   MEDICAL RECORD NO.:  [id number]          PATIENT TYPE:  INP   LOCATION:  4729                         FACILITY:  [facility]   PHYSICIAN:  Candyce Churn, M.D.DATE OF BIRTH:  01-29-1922   DATE OF ADMISSION:  05/21/2005  DATE OF DISCHARGE:                                HISTORY & PHYSICAL   CHIEF COMPLAINT:  Bloody bowel movements.   HISTORY OF PRESENT ILLNESS:  Mr. Chad French is a pleasant 77 year old male  with apparent use of six weeks of Aleve - takes two at bedtime, without GI  prophylaxis.  He also takes a baby aspirin daily.  He has been taking the  Aleve for arthritis pain.  Has had three epidural injections in his back  over the last several months by history for lumbar DJD.   The patient developed fecal/rectal urgency late in the evening on May 21, 2005, and noticed dark red bloody rectal discharge with no stool.  He  came to the [facility] Emergency Room and had another large BM that was very  dark and grossly heme-positive.  Rectal examination per Dr. Read Drivers revealed  a normal prostate and gross blood.  The patient reports as having had a  sigmoidoscopy a few years ago - did not have anesthesia - and this is  apparently normal per Dr. Nadine Counts Buccini.   PRIMARY CARE PHYSICIAN:  Ladell Pier, M.D.   Otherwise, he feels relatively well and denies any chest pains or shortness  of breath.  He felt slightly dizzy earlier when he had the first bloody  bowel movement.   PAST MEDICAL HISTORY:  1.  Obesity.  2.  Mild elevation of blood pressure.  3.  Type 2 diabetes mellitus.  4.  Dyslipidemia.  5.  DJD of multiple joints and back.  6.  Anxiety/depression.  7.  Erectile dysfunction.  8.  Left index finger partial amputation in the past secondary to      woodworking injury.   PAST SURGICAL HISTORY:  Tonsillectomy and adenoidectomy.   MEDICATIONS:  1.  Aleve two p.o. q.h.s. for the last six to eight  weeks.  2.  Aspirin 81 mg daily.  3.  Lipitor 20 mg daily.  4.  Fish oil 2 g daily.  5.  Glucosamine supplement.  6.  Amaryl 2 mg daily.  7.  Multivitamin one daily.   FAMILY HISTORY:  Noncontributory.   SOCIAL HISTORY:  The patient is retired from [employer].  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  He is married.  His wife  is cognitively intact and she has a permanent pacemaker.  They have two  children.   There is a question as to whether or not he had a colonoscopy in the past,  and it sounds like he has not and that he has not had any sedation.  I just  did a Fleet's enema prior to the procedure.  This is apparently within  normal limits.   REVIEW OF SYSTEMS:  As per HPI.   PHYSICAL EXAMINATION:  GENERAL:  Obese male laying supine, in no  acute  distress.  He does not appear particularly pale.  VITAL SIGNS:  Temperature is 97.7, blood pressure 127/72, pulse 95 and  regular, respiratory rate 20 and not labored, O2 saturation on room air is  98%.  HEENT:  Question of very mildly pale conjunctivae.  Oropharynx is clear and  moist.  NECK:  Supple without JVD, bruits, or thyromegaly, or masses.  CHEST:  Clear to auscultation.  Exhibit pectus excavatum.  CARDIOVASCULAR:  Regular rhythm, no murmurs or gallops.  ABDOMEN:  With mildly increased bowel sounds, but no rebound, no pain to  palpation.  EXTREMITIES:  Without edema.  GENITOURINARY:  He is an uncircumcised male.  RECTAL:  Grossly bloody with a blackish component per Dr. Read Drivers.  Prostate  was palpated as normal according to Dr. Read Drivers.   LABORATORY DATA:  Hemoglobin is normal at 14.6, white count is 7000,  platelet count 186,000.  Prothrombin time was 13.3 seconds, PT is 31  seconds.  Sodium 144, potassium 3.8, chloride 112, bicarbonate 23, BUN 17,  creatinine 0.9, glucose was 141 on admission, then 227 on second check.   ASSESSMENT:  An 77 year old male with a gastrointestinal bleed with  dark and  red stool.  This, I suspect, is from an upper source, but could certainly be  lower.  He has no reported history of diverticulosis.  Could certainly have  a gastric ulcer or duodenal ulcer because of non-steroidal anti-inflammatory  drug use.  He has been on the Aleve for about two months and also on a baby  aspirin daily.   PLAN:  1.  Gastrointestinal bleed.  Check q.4h. hemoglobin's and place him on a      cardiac monitor.  We will treat with intravenous Protonix and transfuse      if less than 9 g of hemoglobin noted.  Gastrointestinal consult in a.m.      or sooner if vital signs change rapidly or hemoglobin drops quickly.  2.  Diabetes mellitus.  Sliding scale Novolog while n.p.o.  3.  Hypercholesterolemia.  4.  Hold p.o. medications for now.  5.  The patient is a full code.      Candyce Churn, M.D.  Electronically Signed    RNG/MEDQ  D:  05/22/2005  T:  05/22/2005  Job:  [id number]

## 2011-01-07 NOTE — Discharge Summary (Signed)
NAME:  Chad French, Chad French NO.:  [id number]   MEDICAL RECORD NO.:  [id number]          PATIENT TYPE:  INP   LOCATION:  4704                         FACILITY:  [facility]   PHYSICIAN:  Ladell Pier, M.D.   DATE OF BIRTH:  1922-08-01   DATE OF ADMISSION:  05/21/2005  DATE OF DISCHARGE:                                 DISCHARGE SUMMARY   DISCHARGE DIAGNOSES:  1.  Gastrointestinal bleed, negative upper and lower endoscopy, lower      endoscopy shows diverticulosis.  2.  Obesity.  3.  Hypertension.  4.  Type 2 diabetes.  5.  Dyslipidemia.  6.  Degenerative joint disease of multiple joints and lower back.  7.  Anxiety depression.  8.  Erectile dysfunction.  9.  Left index finger, partial amputation in the past secondary to      woodworking injury.  10. Tonsillectomy and adenoidectomy.  11. Diverticulosis.  12. Blood loss anemia with hemoglobin at discharge of 9.9.   DISCHARGE MEDICATIONS:  1.  Aspirin 81 mg a day.  2.  Lipitor 20 mg a day.  3.  Fish oil 2 grams a daily.  4.  Glucosamine supplement.  5.  Amaryl 2 mg a day.  6.  Multivitamin daily.   CONSULTANTS:  Dr. Randa Evens of GI.   PROCEDURE:  Upper and lower endoscopy.   FOLLOWUP APPOINTMENTS:  Patient is to followup in office in 1 week at that  time will get H&H.   HISTORY OF PRESENT ILLNESS:  The patient is an 77 year old white male who  states apparently 6 weeks ago, taking 2 Aleve at bedtime for arthritis pain.  He had 3 repeat dural injections in the back over the last several months  for DJD.  He developed a fecal-rectal urgency late in the evening and noted  dark red bloody bowel movement.  He came into the hospital and was guaiac-  positive and was admitted for further workup.   Past medical history, family history, social history, medications,  allergies, review of systems per admission H&P.   PHYSICAL EXAMINATION:  VITAL SIGNS:  On discharge temperature 97.9, pulse  76, respirations 20, blood  pressure 127/62.  CBG 136.  HEENT:  Head-normocephalic, atraumatic.  Pupils are equal, round, and  reacted to light.  CARDIOVASCULAR:  Regular rate and rhythm.  LUNGS:  Clear bilaterally.  ABDOMEN:  Positive bowel sounds, nontender, nondistended.  EXTREMITIES:  Without edema.   HOSPITAL COURSE BY PROBLEM:  Problem #1:  GASTROINTESTINAL BLEED/BLOOD LOSS  ANEMIA.  He had some diverticulosis.  The patient most likely had a lower GI  bleed from a diverticular bleed.  Upper and lower endoscopy did not show any  active bleeding so he was discharged home.  Hemoglobin 9.9.  Told to  followup in the office in 1 week for H&H check.   Problem #2:  DIABETES.  He received sliding scale insulin while he was an  inpatient and his blood pressure remained stable.   Problem #3:  HISTORY OF ELEVATED BLOOD PRESSURE.  Blood pressure was  monitored while he was in the hospital.  Blood pressure remained stable.   Problem #  4:  OSTEOARTHRITIS.  He was told to take Tylenol along with his  glucosamine and chondroitin sulfate for his osteoarthritis and not to use  any more Aleve.   Problem #5:  DYSLIPIDEMIA.  Continue the fish oil and Lipitor.   Problem #6:  DIVERTICULOSIS.  He will be given a sheet for a diverticular  diet.   Problem #7:  ANEMIA.  We will monitor his hemoglobin, hopefully will  gradually improve post discharge.   DISCHARGE LABS:  Sodium 142, potassium 3.4 (given __________ K prior to  discharge), chloride 113, CO2 24, glucose 109, BUN 7, creatinine 1.9.  CBC/WBC 6.8, hemoglobin 9.9, MCV 93, platelets at 170.  PTT 31.  PT 13.3,  INR 1.      Ladell Pier, M.D.  Electronically Signed     NJ/MEDQ  D:  05/25/2005  T:  05/25/2005  Job:  [id number]   cc:   Fayrene Fearing L. Malon Kindle., M.D.  Fax: [phone]

## 2011-01-07 NOTE — Consult Note (Signed)
NAME:  SUSAN, ARANA NO.:  [id number]   MEDICAL RECORD NO.:  [id number]          PATIENT TYPE:  INP   LOCATION:  4729                         FACILITY:  [facility]   PHYSICIAN:  Shirley Friar, MDDATE OF BIRTH:  [birth year]   DATE OF CONSULTATION:  DATE OF DISCHARGE:                                   CONSULTATION   REASON FOR CONSULT:  Melena.   HISTORY OF PRESENT ILLNESS:  An 77 year old white male with history of  diabetes mellitus, type 2, hypertension, hyperlipidemia, obesity who reports  taking 2 Aleve tablets at bedtime for the last 2 weeks and came in yesterday  when he started having several episodes of black, tarry stools.  He states  prior to yesterday, he never had black stools and he reports 1 or 2 episodes  being a quart of black color to it.  He denies any abdominal pain, denies  any nausea, vomiting, hematemesis, hematochezia or weight loss.  He has  never had a screening colonoscopy, but does report having a flexible  sigmoidoscopy 3 years ago by Dr. Matthias Hughs that he says was normal.  He  denies any other NSAIDs and he is not on any anticoagulation.  Also noted,  he does take a baby aspirin each day.   In the emergency department, patient was hemodynamically stable with a blood  pressure of 109/69, a heart rate of 82.  His hemoglobin on presentation was  15 with a hematocrit of 45, and currently his hemoglobin is 11.9.   PAST MEDICAL HISTORY:  1.  Diabetes mellitus, type 2.  2.  Hypertension.  3.  Hyperlipidemia.  4.  Obesity.  5.  Arthritis.   MEDICATIONS ON ADMISSION:  1.  Aleve as stated in HPI.  2.  Aspirin 81 mg daily.  3.  Amaryl.  4.  Lipitor.  5.  Glucosamine.  6.  Multivitamin.   FAMILY HISTORY:  Denies family history of colorectal cancer.   SOCIAL HISTORY:  Denies smoking, none in over 25 years.  Denies alcohol.   PHYSICAL EXAMINATION:  VITAL SIGNS:  Temp 97.6, pulse 82, blood pressure  109/69, O2 sat is 97% on room air.  GENERAL:  Pleasant, alert, no acute distress.  HEENT:  Nonicteric sclerae.  CHEST:  Clear to auscultation bilaterally.  CARDIOVASCULAR:  Regular rate and rhythm without murmurs.  ABDOMEN:  Soft, nontender, nondistended, active bowel sounds, no guarding  and no rebound.  EXTREMITIES:  No edema.   LABS:  CBC: White blood count 7, hemoglobin 15 on admission, currently 12,  hematocrit 45, platelet count 186.  RNR 1.0.  Sodium 142, potassium 4.8,  chloride 113, CO2 24, BUN 17, creatinine 1.1, glucose 156.  Calcium 8.3.   ASSESSMENT:  An 77 year old white male with past medical history as stated  above presents with intermittent, black, tarry stools times 24-36 hours in  the setting of recent NSAID use, that is most likely due to peptic ulcer  disease.  The patient will need an upper endoscopy to rule out peptic ulcer  disease, and if the source is identified on the upper endoscopy could  consider deferring colonoscopy to  when he is an outpatient.  If no source  found on upper endoscopy, then would recommend colonoscopy on the day  following the upper endoscopy.  Since patient is elderly and diabetic,  having inpatient colonoscopy may be his best option; however, I would not  recommend upper endoscopy and colonoscopy on the same day in this case  because of possible changes in volume status with his preparation in the  setting of a possible peptic ulcer disease bleed, which could pose potential  problems.  There is no indication for an emergent EGD today.  We will plan  on the following.   PLAN:  1.  Upper endoscopy next a.m. on May 23, 2005.  2.  IV PPI q.12h.  3.  n.p.o. after midnight.  4.  Serial CBCs and transfuse if needed.  5.  Hold on colonoscopy prep until after upper endoscopy obtained.  A      decision will be made on inpatient colonoscopy at that time.      Shirley Friar, MD  Electronically Signed     VCS/MEDQ  D:  05/22/2005  T:  05/22/2005  Job:  [id number]

## 2011-01-07 NOTE — Op Note (Signed)
NAME:  Chad French, Chad French NO.:  [id number]   MEDICAL RECORD NO.:  [id number]          PATIENT TYPE:  INP   LOCATION:  4704                         FACILITY:  [facility]   PHYSICIAN:  James L. Malon Kindle., M.D.DATE OF BIRTH:  11/13/1921   DATE OF PROCEDURE:  05/24/2005  DATE OF DISCHARGE:                                 OPERATIVE REPORT   PROCEDURE PERFORMED:  Colonoscopy.   ENDOSCOPIST:  Llana Aliment. Edwards, M.D.   MEDICATIONS:  Fentanyl 50 mcg, Versed 3.5 mg IV.   INSTRUMENT USED:  Pediatric adjustable colonoscope.   INDICATIONS FOR PROCEDURE:  Recent gastrointestinal bleeding with  essentially normal endoscopy.   DESCRIPTION OF PROCEDURE:  The procedure had been explained to the patient  and consent obtained.  With the patient in the left lateral decubitus  position, the digital scope was inserted and advanced.  Pediatric scope  used.  Prep excellent.  We were able to easily advance to the cecum.  The  ileocecal valve and appendiceal orifice were seen.  Careful examination of  the cecum shows no signs of arteriovenous malformations in the cecum.  The  scope was withdrawn, no active bleeding, no coffee ground material signs of  recent bleed.  Mucosa was completely normal.  Minimal diverticular disease  in the descending colon, fairly moderate diverticulosis in the sigmoid colon  without signs of active bleeding.  The rectum was free of bleeding.  Minimal  hemorrhoids.  The scope was withdrawn.  The patient tolerated the procedure  well.   ASSESSMENT:  Diverticulosis, probably the source of bleeding, 562.10.   PLAN:  Will check a CBC in the morning, probably discharge.  Go ahead and  give the diverticulosis information sheet and resume diet.           ______________________________  Llana Aliment. Malon Kindle., M.D.     Waldron Session  D:  05/24/2005  T:  05/24/2005  Job:  [id number]   cc:   Ladell Pier, M.D.  Fax: [phone]

## 2011-01-07 NOTE — Op Note (Signed)
NAME:  MORAD, TAL NO.:  [id number]   MEDICAL RECORD NO.:  [id number]          PATIENT TYPE:  INP   LOCATION:  4729                         FACILITY:  [facility]   PHYSICIAN:  James L. Malon Kindle., M.D.DATE OF BIRTH:  08-24-1921   DATE OF PROCEDURE:  05/23/2005  DATE OF DISCHARGE:                                 OPERATIVE REPORT   PROCEDURE PERFORMED:  Esophagogastroduodenoscopy.   MEDICATIONS:  Cetacaine spray, fentanyl 50 mcg, Versed 3 mg IV.   ENDOSCOPIST:  Llana Aliment. Randa Evens, M.D.   INDICATIONS FOR PROCEDURE:  Acute gastrointestinal bleeding in a gentleman  who had a colonoscopy done five or six years ago. He had been on some Aleve  so the first step was to perform an upper endoscopy looking for the source  of his bleeding.   DESCRIPTION OF PROCEDURE:  The procedure had been explained to the patient  and consent obtained.  With the patient in left lateral decubitus position,  the Olympus scope was inserted and advanced.  The stomach was entered.  The  pylorus identified and passed.  The duodenum including the bulb and second  portion were seen well and were totally unremarkable.  The scope was  withdrawn back into the stomach.  The pyloric channel, antrum and body were  seen well and were normal.  The fundus and cardia were seen well on the  retroflex view and were normal.  There was a 5 cm hiatal hernia.  No signs  of active or recent bleeding.  The GE junction was distinct and was normal.  The distal and proximal esophagus were endoscopically normal.  No varices  were seen.  The scope was withdrawn.  The patient tolerated the procedure  well.   ASSESSMENT:  Acute gastrointestinal bleeding with no source of upper GI  endoscopy.   PLAN:  Will proceed with colonoscopy tomorrow.  Will discuss this with him  and his family.           ______________________________  Llana Aliment Malon Kindle., M.D.     Waldron Session  D:  05/23/2005  T:  05/23/2005  Job:   [id number]   cc:   Ladell Pier, M.D.  Fax: [phone]

## 2012-01-05 ENCOUNTER — Inpatient Hospital Stay (HOSPITAL_COMMUNITY)
Admission: EM | Admit: 2012-01-05 | Discharge: 2012-01-08 | DRG: 378 | Disposition: A | Discharge status: Home / Self Care | Payer: Medicare Other | Attending: Internal Medicine | Admitting: Internal Medicine

## 2012-01-05 ENCOUNTER — Encounter (HOSPITAL_COMMUNITY): Payer: Self-pay | Admitting: Emergency Medicine

## 2012-01-05 DIAGNOSIS — K5792 Diverticulitis of intestine, part unspecified, without perforation or abscess without bleeding: Secondary | ICD-10-CM | POA: Insufficient documentation

## 2012-01-05 DIAGNOSIS — E782 Mixed hyperlipidemia: Secondary | ICD-10-CM

## 2012-01-05 DIAGNOSIS — I472 Ventricular tachycardia, unspecified: Secondary | ICD-10-CM | POA: Diagnosis present

## 2012-01-05 DIAGNOSIS — I10 Essential (primary) hypertension: Secondary | ICD-10-CM | POA: Diagnosis present

## 2012-01-05 DIAGNOSIS — E119 Type 2 diabetes mellitus without complications: Secondary | ICD-10-CM | POA: Diagnosis present

## 2012-01-05 DIAGNOSIS — H409 Unspecified glaucoma: Secondary | ICD-10-CM | POA: Diagnosis present

## 2012-01-05 DIAGNOSIS — D649 Anemia, unspecified: Secondary | ICD-10-CM | POA: Diagnosis present

## 2012-01-05 DIAGNOSIS — M51379 Other intervertebral disc degeneration, lumbosacral region without mention of lumbar back pain or lower extremity pain: Secondary | ICD-10-CM | POA: Diagnosis present

## 2012-01-05 DIAGNOSIS — I4729 Other ventricular tachycardia: Secondary | ICD-10-CM | POA: Diagnosis present

## 2012-01-05 DIAGNOSIS — H9190 Unspecified hearing loss, unspecified ear: Secondary | ICD-10-CM | POA: Diagnosis present

## 2012-01-05 DIAGNOSIS — Z79899 Other long term (current) drug therapy: Secondary | ICD-10-CM

## 2012-01-05 DIAGNOSIS — D62 Acute posthemorrhagic anemia: Secondary | ICD-10-CM | POA: Diagnosis present

## 2012-01-05 DIAGNOSIS — K5731 Diverticulosis of large intestine without perforation or abscess with bleeding: Secondary | ICD-10-CM

## 2012-01-05 DIAGNOSIS — M5137 Other intervertebral disc degeneration, lumbosacral region: Secondary | ICD-10-CM | POA: Diagnosis present

## 2012-01-05 DIAGNOSIS — IMO0002 Reserved for concepts with insufficient information to code with codable children: Secondary | ICD-10-CM

## 2012-01-05 DIAGNOSIS — K922 Gastrointestinal hemorrhage, unspecified: Secondary | ICD-10-CM | POA: Diagnosis present

## 2012-01-05 DIAGNOSIS — E118 Type 2 diabetes mellitus with unspecified complications: Secondary | ICD-10-CM

## 2012-01-05 DIAGNOSIS — E785 Hyperlipidemia, unspecified: Secondary | ICD-10-CM | POA: Diagnosis present

## 2012-01-05 DIAGNOSIS — E1165 Type 2 diabetes mellitus with hyperglycemia: Secondary | ICD-10-CM

## 2012-01-05 DIAGNOSIS — Z794 Long term (current) use of insulin: Secondary | ICD-10-CM

## 2012-01-05 HISTORY — DX: Reserved for concepts with insufficient information to code with codable children: IMO0002

## 2012-01-05 HISTORY — DX: Essential (primary) hypertension: I10

## 2012-01-05 HISTORY — DX: Diverticulitis of intestine, part unspecified, without perforation or abscess without bleeding: K57.92

## 2012-01-05 HISTORY — DX: Hyperlipidemia, unspecified: E78.5

## 2012-01-05 HISTORY — DX: Type 2 diabetes mellitus without complications: E11.9

## 2012-01-05 LAB — COMPREHENSIVE METABOLIC PANEL
ALT: 12 U/L (ref 0–53)
Alkaline Phosphatase: 72 U/L (ref 39–117)
BUN: 20 mg/dL (ref 6–23)
CO2: 23 mEq/L (ref 19–32)
Calcium: 8.4 mg/dL (ref 8.4–10.5)
GFR calc Af Amer: 72 mL/min — ABNORMAL LOW (ref >90)
GFR calc non Af Amer: 62 mL/min — ABNORMAL LOW (ref >90)
Glucose, Bld: 127 mg/dL — ABNORMAL HIGH (ref 70–99)
Potassium: 4.3 mEq/L (ref 3.5–5.1)
Sodium: 141 mEq/L (ref 135–145)
Total Protein: 6.3 g/dL (ref 6.0–8.3)

## 2012-01-05 LAB — CBC
HCT: 40.6 % (ref 39.0–52.0)
MCH: 31.9 pg (ref 26.0–34.0)
MCV: 96.7 fL (ref 78.0–100.0)
Platelets: 213 10*3/uL (ref 150–400)
RDW: 12.8 % (ref 11.5–15.5)
WBC: 9.8 10*3/uL (ref 4.0–10.5)

## 2012-01-05 LAB — OCCULT BLOOD, POC DEVICE: Fecal Occult Bld: POSITIVE

## 2012-01-05 LAB — TYPE AND SCREEN: ABO/RH(D): O POS

## 2012-01-05 MED ORDER — SODIUM CHLORIDE 0.9 % IV SOLN: Freq: Once | INTRAVENOUS | Status: DC

## 2012-01-05 MED ORDER — FAMOTIDINE IN NACL 20-0.9 MG/50ML-% IV SOLN
20.0000 mg | Freq: Once | INTRAVENOUS | Status: AC
  Administered 2012-01-05: 20 mg via INTRAVENOUS
  Filled 2012-01-05: qty 50

## 2012-01-05 MED ORDER — SODIUM CHLORIDE 0.9 % IV SOLN
40.0000 mg | Freq: Two times a day (BID) | INTRAVENOUS | Status: DC
  Administered 2012-01-06: 40 mg via INTRAVENOUS
  Filled 2012-01-05: qty 4

## 2012-01-05 NOTE — ED Notes (Signed)
Per EMS pt c/o rectal bleeding with BM x 3 today, 1st being bright red, then dark. Pt called PCP advised if pt experienced another BM with blood come to ED. Pt decided to come to ED with EMS.

## 2012-01-05 NOTE — ED Provider Notes (Signed)
History     CSN: [id number]  Arrival date & time 01/05/12  1927   None     Chief Complaint  Patient presents with  . Rectal Bleeding    (Consider location/radiation/quality/duration/timing/severity/associated sxs/prior treatment) Patient is a 78 y.o. male presenting with hematochezia. The history is provided by the patient. No language interpreter was used.  Rectal Bleeding  The current episode started today. The problem occurs occasionally. The patient is experiencing no pain. Pertinent negatives include no fever, no abdominal pain, no diarrhea, no hemorrhoids, no nausea, no rectal pain, no vomiting, no chest pain, no coughing and no difficulty breathing. He has been behaving normally. He has been eating and drinking normally. There were sick contacts at home.   3 bloody stools since 4 PM. Patient states that the first stool was dark red. States that the second stool was a little bit lighter than it was almost a cup of blood. States that he has had a colonoscopy in the past last one was in 2012. This colonoscopy showed diverticulosis.   Complaining of no abdominal pain at this time.  Patient denies dizziness. Is tachycardic upon arrival. States that he called Dr. polite prior to arrival and Dr. polite told him to come to the ER. He states that Dr. polite that his last colonoscopy April 2012. Past medical history diverticulitis, hypertension, diabetes, hyperlipidemia.   Past Medical History  Diagnosis Date  . Diverticulitis     History reviewed. No pertinent past surgical history.  No family history on file.  History  Substance Use Topics  . Smoking status: Former Games developer  . Smokeless tobacco: Not on file  . Alcohol Use: No      Review of Systems  Constitutional: Negative.  Negative for fever.  HENT: Negative.   Eyes: Negative.   Respiratory: Negative.  Negative for cough.   Cardiovascular: Negative.  Negative for chest pain.  Gastrointestinal: Positive for blood in  stool, hematochezia and anal bleeding. Negative for nausea, vomiting, abdominal pain, diarrhea, constipation, abdominal distention, rectal pain and hemorrhoids.  Neurological: Negative.  Negative for dizziness, weakness and light-headedness.  Psychiatric/Behavioral: Negative.   All other systems reviewed and are negative.    Allergies  Review of patient's allergies indicates no known allergies.  Home Medications   Current Outpatient Rx  Name Route Sig Dispense Refill  . ATORVASTATIN CALCIUM 20 MG PO TABS Oral Take 20 mg by mouth daily.    Marland Kitchen FERROUS FUMARATE 325 (106 FE) MG PO TABS Oral Take 1 tablet by mouth daily.    Marland Kitchen GLIMEPIRIDE 1 MG PO TABS Oral Take 3 mg by mouth daily before breakfast.    . KETOROLAC TROMETHAMINE 0.4 % OP SOLN  1 drop 4 (four) times daily.    Marland Kitchen LISINOPRIL 40 MG PO TABS Oral Take 40 mg by mouth daily.    . MELOXICAM 15 MG PO TABS Oral Take 15 mg by mouth daily.    . OMEGA-3-ACID ETHYL ESTERS 1 G PO CAPS Oral Take 1 g by mouth daily.    Marland Kitchen TIMOLOL HEMIHYDRATE 0.5 % OP SOLN Both Eyes Place 2 drops into both eyes 2 (two) times daily.      BP 126/65  Pulse 104  Temp(Src) 98 F (36.7 C) (Oral)  Resp 16  SpO2 97%  Physical Exam  Nursing note and vitals reviewed. Constitutional: He is oriented to person, place, and time. He appears well-developed and well-nourished.  HENT:  Head: Normocephalic.  Eyes: Conjunctivae and EOM are normal. Pupils  are equal, round, and reactive to light.  Neck: Normal range of motion. Neck supple.  Cardiovascular: Regular rhythm.        Tachy   Pulmonary/Chest: Effort normal.  Abdominal: Soft. Bowel sounds are normal. He exhibits no distension. There is no tenderness. There is no rebound and no guarding.  Musculoskeletal: Normal range of motion.  Neurological: He is alert and oriented to person, place, and time.  Skin: Skin is warm and dry.  Psychiatric: He has a normal mood and affect.    ED Course  Procedures (including  critical care time)  Labs Reviewed  CBC - Abnormal; Notable for the following:    RBC 4.20 (*)    All other components within normal limits  COMPREHENSIVE METABOLIC PANEL - Abnormal; Notable for the following:    Glucose, Bld 127 (*)    Albumin 3.4 (*)    GFR calc non Af Amer 62 (*)    GFR calc Af Amer 72 (*)    All other components within normal limits  TYPE AND SCREEN  PROTIME-INR   No results found.   No diagnosis found.    MDM  Admitted for GI bleed for 24-hour office by the hospitalist. Patient is tachycardic. Hemoglobin is 13.3. Denies dizziness. Dr. polite will take over care tomorrow. Positive Hemoccult. Labs Reviewed  CBC - Abnormal; Notable for the following:    RBC 4.20 (*)    All other components within normal limits  COMPREHENSIVE METABOLIC PANEL - Abnormal; Notable for the following:    Glucose, Bld 127 (*)    Albumin 3.4 (*)    GFR calc non Af Amer 62 (*)    GFR calc Af Amer 72 (*)    All other components within normal limits  TYPE AND SCREEN  PROTIME-INR  OCCULT BLOOD, POC DEVICE           Remi Haggard, NP 01/05/12 2325

## 2012-01-05 NOTE — ED Notes (Signed)
9 run of [facility] noted, PA made aware, will alert hospitalist

## 2012-01-05 NOTE — ED Notes (Signed)
Pt states he has had 3 BM since 1700, first one he noted bright red blood, next 2 with large amount dark red blood. Pt states had similar episode 3 years ago with diverticulitis. Pt denies n/v, denies fever, denies pain

## 2012-01-05 NOTE — H&P (Signed)
PCP:   No primary provider on file.   Chief Complaint:  Dark blood per rectum  78 y/o cm with known diverticulosis-at 1600 had a bloody BM and then at around 1700 had the second.  The first one was very dark and a large amount, and the second one was a little lighter in colour.   Just looked like blood-no stool mixed with this.  No.no cramping no pain. States this just happened on its own.  No abd pain, no n/v., no sick contacts with diarrhoea or any other issues.-no recent exposure to antibiotcis..   Was diarrhea like and explosive .  No abd cramping either.  Appetite has been okay.  States that he doesn't take any NSAIDs.  Was taken off of Asa at the time of his prior GIB by PCP. No dizzyness, no wekaness-came ove to the Ed by ambulance    Review of Systems:  The patient denies Cp/SOb/n/v/fever/ cough cold/ blurred or double vision/no wekaness on one sid eof the body  Past Medical History: Past Medical History  Diagnosis Date  . Diverticulitis     with colonoscopy in 11/2010-Diverticula and polyp  . HTN (hypertension)   . HLD (hyperlipidemia)   . Degenerative disc disease   . Diabetes mellitus   . Obesity, Class III, BMI 40-49.9 (morbid obesity)   . Glaucoma     Past surgical history: Past Surgical History  Procedure Date  . Colonoscopy 4/12    Medications: Prior to Admission medications   Medication Sig Start Date End Date Taking? Authorizing Provider  atorvastatin (LIPITOR) 20 MG tablet Take 20 mg by mouth daily.   Yes Historical Provider, MD  ferrous fumarate (HEMOCYTE - 106 MG FE) 325 (106 FE) MG TABS Take 1 tablet by mouth daily.   Yes Historical Provider, MD  glimepiride (AMARYL) 1 MG tablet Take 3 mg by mouth daily before breakfast.   Yes Historical Provider, MD  ketorolac (ACULAR) 0.4 % SOLN 1 drop 4 (four) times daily.   Yes Historical Provider, MD  lisinopril (PRINIVIL,ZESTRIL) 40 MG tablet Take 40 mg by mouth daily.   Yes Historical Provider, MD  meloxicam (MOBIC)  15 MG tablet Take 15 mg by mouth daily.   Yes Historical Provider, MD  omega-3 acid ethyl esters (LOVAZA) 1 G capsule Take 1 g by mouth daily.   Yes Historical Provider, MD  timolol (BETIMOL) 0.5 % ophthalmic solution Place 2 drops into both eyes 2 (two) times daily.   Yes Historical Provider, MD    Allergies:  No Known Allergies  Social History:  reports that he quit smoking about 40 years ago. He does not have any smokeless tobacco history on file. He reports that he does not drink alcohol or use illicit drugs.  Family History: Family History  Problem Relation Age of Onset  . Diabetes Mother   . Diabetes Maternal Grandmother   . Kidney disease Maternal Grandfather     Physical Exam: Filed Vitals:   01/05/12 1927 01/05/12 1930  BP: 130/76 126/65  Pulse: 100 104  Temp:  98 F (36.7 C)  TempSrc:  Oral  Resp: 16 16  SpO2:  97%    HEENT-alert pleasant slightly hard of hearing obese Caucasian male looking about stated age.  Has partial dentures. CHEST-chest clinically clear no added sound CARDS-S1-S2 no murmur rub or gallop. Telemetry done earlier today showed V. tach about 9 beats which was nonsustained ABD-soft nontender nondistended, no rebound no guarding SKIN-no lower extremity swelling NEURO-alert, oriented x3  speech: normal in context and clarity memory: intact grossly cranial nerves II-XII: intact motor strength: full proximally and distally sensation: intact to vibration, pain, and light touch reflexes: full and symmetric plantar responses: downgoing bilaterally Gait not elicited   Labs on Admission:   [facility] 01/05/12 2005  NA 141  K 4.3  CL 109  CO2 23  GLUCOSE 127*  BUN 20  CREATININE 1.04  CALCIUM 8.4  MG --  PHOS --    Basename 01/05/12 2005  AST 14  ALT 12  ALKPHOS 72  BILITOT 0.3  PROT 6.3  ALBUMIN 3.4*   No results found for this basename: LIPASE:2,AMYLASE:2 in the last 72 hours  Basename 01/05/12 2005  WBC 9.8  NEUTROABS --    HGB 13.4  HCT 40.6  MCV 96.7  PLT 213   No results found for this basename: CKTOTAL:3,CKMB:3,CKMBINDEX:3,TROPONINI:3 in the last 72 hours No results found for this basename: TSH,T4TOTAL,FREET3,T3FREE,THYROIDAB in the last 72 hours No results found for this basename: VITAMINB12:2,FOLATE:2,FERRITIN:2,TIBC:2,IRON:2,RETICCTPCT:2 in the last 72 hours  Radiological Exams on Admission: No results found.  Assessment/Plan #1-likely diverticular bleed with what patient describes as being more likely than not hematochezia-patient's hemoglobin initially has been stable at 13. I expect that this will drop. We will repeat hemoglobin every 8 hourly, I will consult gastroenterology for further recommendations possible colonoscopy. Patient has received Pepcid 20 mg and we will continue this as 40 mg twice a day, although this probably would help with more gastritis than lower GI bleed. I have requested ETT to obtain 2 large bore IVs in case volume resuscitation as needed however at this present time patient is hemodynamically stable-he is being crossed and typed for blood we'll hold transfusion at this time -I did speak to Dr. Dulce Sellar of gastroenterology who will arrange for patient to be seen in possible scope to be done  #2-transient ventricular tachycardia at 9 beats-patient has been on metoprolol in the past and currently is not on this. For right now we will hold on antihypertensive and keep him on telemetry. If this recurs we will implement low-dose selective beta blocker  #3 diabetes mellitus-we'll place patient on sliding scale insulin-we will hold his regular Amaryl 3 mg every morning.  #4-hypertension given his GI bleed and propensity to drop his blood pressure get orthostatics and for now hold his lisinopril 40 mg daily  #5-hyperlipidemia hold atorvastatin 20 mg daily  #6 anemia-hold Oral iron for now.  Admitted to Dr. polite   over 60 minutes in care coordination and interview of  patient Presumed focal  Chad French,Chad French 01/05/2012, 11:06 PM

## 2012-01-05 NOTE — ED Notes (Signed)
Pts POA Chad French notified of status. [phone]

## 2012-01-05 NOTE — ED Notes (Signed)
RUE:AV40<JW> Expected date:<BR> Expected time: 7:18 PM<BR> Means of arrival:<BR> Comments:<BR> M231 - 89yoM Blood in stool

## 2012-01-05 NOTE — ED Provider Notes (Signed)
Medical screening examination/treatment/procedure(s) were conducted as a shared visit with non-physician practitioner(s) and myself.  I personally evaluated the patient during the encounter   Loren Racer, MD 01/05/12 [phone]

## 2012-01-06 DIAGNOSIS — D649 Anemia, unspecified: Secondary | ICD-10-CM | POA: Diagnosis present

## 2012-01-06 DIAGNOSIS — E1165 Type 2 diabetes mellitus with hyperglycemia: Secondary | ICD-10-CM

## 2012-01-06 DIAGNOSIS — K922 Gastrointestinal hemorrhage, unspecified: Secondary | ICD-10-CM | POA: Diagnosis present

## 2012-01-06 DIAGNOSIS — K5731 Diverticulosis of large intestine without perforation or abscess with bleeding: Secondary | ICD-10-CM

## 2012-01-06 DIAGNOSIS — E118 Type 2 diabetes mellitus with unspecified complications: Secondary | ICD-10-CM

## 2012-01-06 DIAGNOSIS — IMO0002 Reserved for concepts with insufficient information to code with codable children: Secondary | ICD-10-CM

## 2012-01-06 DIAGNOSIS — E782 Mixed hyperlipidemia: Secondary | ICD-10-CM

## 2012-01-06 DIAGNOSIS — I10 Essential (primary) hypertension: Secondary | ICD-10-CM

## 2012-01-06 LAB — CBC
HCT: 35.8 % — ABNORMAL LOW (ref 39.0–52.0)
HCT: 35.2 % — ABNORMAL LOW (ref 39.0–52.0)
Hemoglobin: 11.5 g/dL — ABNORMAL LOW (ref 13.0–17.0)
MCH: 32.9 pg (ref 26.0–34.0)
MCH: 32 pg (ref 26.0–34.0)
MCH: 32.8 pg (ref 26.0–34.0)
MCHC: 32.7 g/dL (ref 30.0–36.0)
MCHC: 33.6 g/dL (ref 30.0–36.0)
MCV: 98.1 fL (ref 78.0–100.0)
MCV: 98.1 fL (ref 78.0–100.0)
MCV: 97.5 fL (ref 78.0–100.0)
Platelets: 170 10*3/uL (ref 150–400)
RBC: 3.65 MIL/uL — ABNORMAL LOW (ref 4.22–5.81)
RBC: 3.66 MIL/uL — ABNORMAL LOW (ref 4.22–5.81)
RDW: 12.8 % (ref 11.5–15.5)
WBC: 7.9 10*3/uL (ref 4.0–10.5)

## 2012-01-06 LAB — GLUCOSE, CAPILLARY: Glucose-Capillary: 94 mg/dL (ref 70–99)

## 2012-01-06 MED ORDER — INSULIN ASPART 100 UNIT/ML ~~LOC~~ SOLN: 0.0000 [IU] | Freq: Three times a day (TID) | SUBCUTANEOUS | Status: DC

## 2012-01-06 MED ORDER — GLIMEPIRIDE 1 MG PO TABS
3.0000 mg | ORAL_TABLET | Freq: Every day | ORAL | Status: DC
  Filled 2012-01-06 (×2): qty 3

## 2012-01-06 MED ORDER — PANTOPRAZOLE SODIUM 40 MG IV SOLR
40.0000 mg | Freq: Two times a day (BID) | INTRAVENOUS | Status: DC
  Administered 2012-01-06 – 2012-01-08 (×5): 40 mg via INTRAVENOUS
  Filled 2012-01-06 (×6): qty 40

## 2012-01-06 MED ORDER — TIMOLOL MALEATE 0.5 % OP SOLN
2.0000 [drp] | Freq: Two times a day (BID) | OPHTHALMIC | Status: DC
  Administered 2012-01-06 – 2012-01-08 (×5): 2 [drp] via OPHTHALMIC
  Filled 2012-01-06: qty 5

## 2012-01-06 MED ORDER — BRIMONIDINE TARTRATE 0.15 % OP SOLN
1.0000 [drp] | Freq: Two times a day (BID) | OPHTHALMIC | Status: DC
  Administered 2012-01-06 – 2012-01-08 (×4): 1 [drp] via OPHTHALMIC
  Filled 2012-01-06: qty 5

## 2012-01-06 MED ORDER — KETOROLAC TROMETHAMINE 0.4 % OP SOLN: 1.0000 [drp] | Freq: Four times a day (QID) | OPHTHALMIC | Status: DC

## 2012-01-06 MED ORDER — KETOROLAC TROMETHAMINE 0.5 % OP SOLN
1.0000 [drp] | Freq: Four times a day (QID) | OPHTHALMIC | Status: DC
  Administered 2012-01-06 – 2012-01-08 (×3): 1 [drp] via OPHTHALMIC
  Filled 2012-01-06: qty 3

## 2012-01-06 NOTE — Consult Note (Signed)
[facility] Consult Note  Referring Provider: No ref. provider found Primary Care Physician:  Katy Apo, MD, MD Primary Gastroenterologist:  Dr.  Antony Contras Complaint: Rectal bleeding HPI: Chad French is an 78 y.o. white male  who presented to the emergency room after 3 bloody bowel movements yesterday without any pain or near syncope. Since presentation he states he has not had a bowel movement. He had an hemoglobin of 13.4 which has fallen to 11.3 with a normal BUN. He had a colonoscopy in 2012 which showed diverticulosis. He's had an EGD and 2006 which showed a hiatal hernia. He currently has no complaints. He is not on any blood thinners but does take meloxicam once a day.  Past Medical History  Diagnosis Date  . Diverticulitis     with colonoscopy in 11/2010-Diverticula and polyp whioch was a non-malignant tubuular adenoma  . HTN (hypertension)   . HLD (hyperlipidemia)   . Degenerative disc disease   . Diabetes mellitus   . Obesity, Class III, BMI 40-49.9 (morbid obesity)   . Glaucoma     Past Surgical History  Procedure Date  . Colonoscopy 4/12    Medications Prior to Admission  Medication Sig Dispense Refill  . atorvastatin (LIPITOR) 20 MG tablet Take 20 mg by mouth daily.      . ferrous fumarate (HEMOCYTE - 106 MG FE) 325 (106 FE) MG TABS Take 1 tablet by mouth daily.      Marland Kitchen glimepiride (AMARYL) 1 MG tablet Take 3 mg by mouth daily before breakfast.      . ketorolac (ACULAR) 0.4 % SOLN 1 drop 4 (four) times daily.      Marland Kitchen lisinopril (PRINIVIL,ZESTRIL) 40 MG tablet Take 40 mg by mouth daily.      . meloxicam (MOBIC) 15 MG tablet Take 15 mg by mouth daily.      Marland Kitchen omega-3 acid ethyl esters (LOVAZA) 1 G capsule Take 1 g by mouth daily.      . timolol (BETIMOL) 0.5 % ophthalmic solution Place 2 drops into both eyes 2 (two) times daily.        Allergies: No Known Allergies  Family History  Problem Relation Age of Onset  . Diabetes Mother   .  Diabetes Maternal Grandmother   . Kidney disease Maternal Grandfather     Social History:  reports that he quit smoking about 40 years ago. He does not have any smokeless tobacco history on file. He reports that he does not drink alcohol or use illicit drugs.  Review of Systems: negative except as above   Blood pressure 143/74, pulse 67, temperature 98.4 F (36.9 C), temperature source Oral, resp. rate 18, height 5\' 8"  (1.727 m), weight 84.7 kg (186 lb 11.7 oz), SpO2 97.00%. Head: Normocephalic, without obvious abnormality, atraumatic Neck: no adenopathy, no carotid bruit, no JVD, supple, symmetrical, trachea midline and thyroid not enlarged, symmetric, no tenderness/mass/nodules Resp: clear to auscultation bilaterally Cardio: regular rate and rhythm, S1, S2 normal, no murmur, click, rub or gallop GI: Abdomen soft slightly distended with normoactive bowel sounds no hepatosplenomegaly mass or guarding. Extremities: extremities normal, atraumatic, no cyanosis or edema  Results for orders placed during the hospital encounter of 01/05/12 (from the past 48 hour(s))  TYPE AND SCREEN     Status: Normal   Collection Time   01/05/12  8:05 PM      Component Value Range Comment   ABO/RH(D) O POS      Antibody Screen NEG  Sample Expiration 01/08/2012     CBC     Status: Abnormal   Collection Time   01/05/12  8:05 PM      Component Value Range Comment   WBC 9.8  4.0 - 10.5 (K/uL)    RBC 4.20 (*) 4.22 - 5.81 (MIL/uL)    Hemoglobin 13.4  13.0 - 17.0 (g/dL)    HCT 40.9  81.1 - 91.4 (%)    MCV 96.7  78.0 - 100.0 (fL)    MCH 31.9  26.0 - 34.0 (pg)    MCHC 33.0  30.0 - 36.0 (g/dL)    RDW 78.2  95.6 - 21.3 (%)    Platelets 213  150 - 400 (K/uL)   COMPREHENSIVE METABOLIC PANEL     Status: Abnormal   Collection Time   01/05/12  8:05 PM      Component Value Range Comment   Sodium 141  135 - 145 (mEq/L)    Potassium 4.3  3.5 - 5.1 (mEq/L)    Chloride 109  96 - 112 (mEq/L)    CO2 23  19 - 32  (mEq/L)    Glucose, Bld 127 (*) 70 - 99 (mg/dL)    BUN 20  6 - 23 (mg/dL)    Creatinine, Ser 0.86  0.50 - 1.35 (mg/dL)    Calcium 8.4  8.4 - 10.5 (mg/dL)    Total Protein 6.3  6.0 - 8.3 (g/dL)    Albumin 3.4 (*) 3.5 - 5.2 (g/dL)    AST 14  0 - 37 (U/L)    ALT 12  0 - 53 (U/L)    Alkaline Phosphatase 72  39 - 117 (U/L)    Total Bilirubin 0.3  0.3 - 1.2 (mg/dL)    GFR calc non Af Amer 62 (*) >90 (mL/min)    GFR calc Af Amer 72 (*) >90 (mL/min)   PROTIME-INR     Status: Normal   Collection Time   01/05/12  8:51 PM      Component Value Range Comment   Prothrombin Time 14.0  11.6 - 15.2 (seconds)    INR 1.06  0.00 - 1.49    OCCULT BLOOD, POC DEVICE     Status: Normal   Collection Time   01/05/12 10:05 PM      Component Value Range Comment   Fecal Occult Bld POSITIVE     CBC     Status: Abnormal   Collection Time   01/06/12  2:22 AM      Component Value Range Comment   WBC 7.9  4.0 - 10.5 (K/uL)    RBC 3.65 (*) 4.22 - 5.81 (MIL/uL)    Hemoglobin 12.0 (*) 13.0 - 17.0 (g/dL)    HCT 57.8 (*) 46.9 - 52.0 (%)    MCV 98.1  78.0 - 100.0 (fL)    MCH 32.9  26.0 - 34.0 (pg)    MCHC 33.5  30.0 - 36.0 (g/dL)    RDW 62.9  52.8 - 41.3 (%)    Platelets 169  150 - 400 (K/uL)   PROTIME-INR     Status: Normal   Collection Time   01/06/12  2:22 AM      Component Value Range Comment   Prothrombin Time 14.0  11.6 - 15.2 (seconds)    INR 1.06  0.00 - 1.49    GLUCOSE, CAPILLARY     Status: Normal   Collection Time   01/06/12  8:46 AM      Component Value Range Comment  Glucose-Capillary 83  70 - 99 (mg/dL)   CBC     Status: Abnormal   Collection Time   01/06/12  9:35 AM      Component Value Range Comment   WBC 7.3  4.0 - 10.5 (K/uL)    RBC 3.59 (*) 4.22 - 5.81 (MIL/uL)    Hemoglobin 11.5 (*) 13.0 - 17.0 (g/dL)    HCT 16.1 (*) 09.6 - 52.0 (%)    MCV 98.1  78.0 - 100.0 (fL)    MCH 32.0  26.0 - 34.0 (pg)    MCHC 32.7  30.0 - 36.0 (g/dL)    RDW 04.5  40.9 - 81.1 (%)    Platelets 156  150 - 400  (K/uL)   GLUCOSE, CAPILLARY     Status: Normal   Collection Time   01/06/12 11:27 AM      Component Value Range Comment   Glucose-Capillary 94  70 - 99 (mg/dL)    No results found.  Assessment: Painless hematochezia likely diverticular with recent colonoscopy within the last year showing only diverticulosis. Plan:  Empiric proton pump inhibitor given his use of Mobic, but do not think this is an upper GI bleed. Otherwise expectant management and will monitor stools and hemoglobin and decide whether any active intervention needed. We'll keep on clear liquid diet until rounds tomorrow Aryanne Gilleland C 01/06/2012, 3:16 PM

## 2012-01-06 NOTE — Progress Notes (Addendum)
Subjective: No specific complaints.  Objective: Vital signs in last 24 hours: Filed Vitals:   01/06/12 0152 01/06/12 0153 01/06/12 0154 01/06/12 0627  BP: 155/77 133/69 131/75 126/72  Pulse: 71 82 89 69  Temp: 98 F (36.7 C)   97.7 F (36.5 C)  TempSrc: Oral   Oral  Resp: 16   16  Height: 5\' 8"  (1.727 m)     Weight: 84.7 kg (186 lb 11.7 oz)     SpO2: 97%   96%   Weight change:   Intake/Output Summary (Last 24 hours) at 01/06/12 1046 Last data filed at 01/06/12 0900  Gross per 24 hour  Intake      0 ml  Output      0 ml  Net      0 ml    Physical Exam: General: Awake, Oriented, No acute distress. HEENT: EOMI. Neck: Supple CV: S1 and S2 Lungs: Clear to ascultation bilaterally Abdomen: Soft, Nontender, Nondistended, +bowel sounds. Ext: Good pulses. Trace edema.  Lab Results:  [facility] 01/05/12 2005  NA 141  K 4.3  CL 109  CO2 23  GLUCOSE 127*  BUN 20  CREATININE 1.04  CALCIUM 8.4  MG --  PHOS --    Basename 01/05/12 2005  AST 14  ALT 12  ALKPHOS 72  BILITOT 0.3  PROT 6.3  ALBUMIN 3.4*   No results found for this basename: LIPASE:2,AMYLASE:2 in the last 72 hours  Basename 01/06/12 0935 01/06/12 0222  WBC 7.3 7.9  NEUTROABS -- --  HGB 11.5* 12.0*  HCT 35.2* 35.8*  MCV 98.1 98.1  PLT 156 169   No results found for this basename: CKTOTAL:3,CKMB:3,CKMBINDEX:3,TROPONINI:3 in the last 72 hours No components found with this basename: POCBNP:3 No results found for this basename: DDIMER:2 in the last 72 hours No results found for this basename: HGBA1C:2 in the last 72 hours No results found for this basename: CHOL:2,HDL:2,LDLCALC:2,TRIG:2,CHOLHDL:2,LDLDIRECT:2 in the last 72 hours No results found for this basename: TSH,T4TOTAL,FREET3,T3FREE,THYROIDAB in the last 72 hours No results found for this basename: VITAMINB12:2,FOLATE:2,FERRITIN:2,TIBC:2,IRON:2,RETICCTPCT:2 in the last 72 hours  Micro Results: No results found for this or any previous visit  (from the past 240 hour(s)).  Studies/Results: No results found.  Medications: I have reviewed the patient's current medications. Scheduled Meds:   . sodium chloride   Intravenous Once  . famotidine (PEPCID) IV  40 mg Intravenous Q12H  . famotidine (PEPCID) IV  20 mg Intravenous Once  . glimepiride  3 mg Oral QAC breakfast  . timolol  2 drop Both Eyes BID   Continuous Infusions:  PRN Meds:.  Assessment/Plan: GI bleed Patient has had a history of lower GI bleed thought to be secondary to diverticulosis in April of 2012.  Patient reports that he has had EGD and colonoscopy about one year ago.  [facility] following.  Change Pepcid to pantoprazole IV twice a day.  Hemoglobin stable.  Anemia due to acute blood loss from GI bleed Drop in hemoglobin due to GI bleed and dilutional.  Stable.  Trend CBC.  Holding home iron.  Transient ventricular tachycardia No further episodes noted.  Monitor telemetry, did not see 9 beats of ventricle or tachycardia commented at the time of admission.  Diabetes Discontinue glimepiride as the patient is n.p.o.  Will have the patient on sensitive sliding scale insulin.  Hypertension Holding home antihypertensive medications at this time.  Stable.  If the patient becomes hypertensive consider restarting her medications at that time.  Hyperlipidemia Statin.  Glaucoma Continue home eyedrops.  Prophylaxis SCDs.  Dr. Nehemiah Settle to round on the patient tomorrow.   LOS: 1 day  Tanush Drees A, MD 01/06/2012, 10:46 AM

## 2012-01-06 NOTE — Progress Notes (Signed)
Introduced myself and chaplain services to pt.  Pt was anxious about up-coming procedure.  Provided support.  Please page if further assistance is needed. [place]  [phone] on-call pager

## 2012-01-06 NOTE — Progress Notes (Signed)
   CARE MANAGEMENT NOTE 01/06/2012  Patient:  Chad French, Chad French   Account Number:  [id number]  Date Initiated:  01/06/2012  Documentation initiated by:  Jiles Crocker  Subjective/Objective Assessment:   ADMITTED WITH RECTAL BLEED     Action/Plan:   PCP IS DR POLITE; LIVES AT HOME WITH SPOUSE; POSSIBLY MAY NEED HHC AT DISCHARGE DEPENDING ON PT/OT EVALS   Anticipated DC Date:  01/13/2012   Anticipated DC Plan:  HOME W HOME HEALTH SERVICES      DC Planning Services  CM consult               Status of service:  In process, will continue to follow Medicare Important Message given?  NA - LOS <3 / Initial given by admissions (If response is "NO", the following Medicare IM given date fields will be blank)  Per UR Regulation:  Reviewed for med. necessity/level of care/duration of stay  Comments:  01/06/2012- B Sanaiyah Kirchhoff RN, BSN, MHA

## 2012-01-07 LAB — CBC
HCT: 35.6 % — ABNORMAL LOW (ref 39.0–52.0)
Hemoglobin: 12 g/dL — ABNORMAL LOW (ref 13.0–17.0)
Hemoglobin: 12.4 g/dL — ABNORMAL LOW (ref 13.0–17.0)
MCH: 32.9 pg (ref 26.0–34.0)
MCHC: 33.7 g/dL (ref 30.0–36.0)
MCV: 96.7 fL (ref 78.0–100.0)
MCV: 96.6 fL (ref 78.0–100.0)
Platelets: 192 10*3/uL (ref 150–400)
RBC: 3.77 MIL/uL — ABNORMAL LOW (ref 4.22–5.81)

## 2012-01-07 LAB — BASIC METABOLIC PANEL
BUN: 14 mg/dL (ref 6–23)
GFR calc non Af Amer: 64 mL/min — ABNORMAL LOW (ref >90)
Glucose, Bld: 81 mg/dL (ref 70–99)
Potassium: 4.2 mEq/L (ref 3.5–5.1)

## 2012-01-07 LAB — GLUCOSE, CAPILLARY: Glucose-Capillary: 152 mg/dL — ABNORMAL HIGH (ref 70–99)

## 2012-01-07 NOTE — Progress Notes (Signed)
Subjective: The patient did not sleep well, however has not had any abdominal pain or evidence of GI bleeding  Objective: Vital signs in last 24 hours: Temp:  [97.3 F (36.3 C)-98.4 F (36.9 C)] 97.8 F (36.6 C) (05/18 0549) Pulse Rate:  [67-89] 70  (05/18 0549) Resp:  [16-18] 16  (05/18 0549) BP: (113-154)/(62-80) 154/62 mmHg (05/18 0549) SpO2:  [96 %-97 %] 96 % (05/18 0549) Weight:  [82.691 kg (182 lb 4.8 oz)] 82.691 kg (182 lb 4.8 oz) (05/18 0549) Weight change: -2.009 kg (-4 lb 6.9 oz) Last BM Date: 01/06/12 (no bloody stools reported upon assessment)  Intake/Output from previous day: 05/17 0701 - 05/18 0700 In: 0  Out: 850 [Urine:850] Intake/Output this shift:    Cardio: regular rate and rhythm, S1, S2 normal, no murmur, click, rub or gallop GI: soft, non-tender; bowel sounds normal; no masses,  no organomegaly  Lab Results:  Basename 01/07/12 0143 01/06/12 1749  WBC 8.3 8.4  HGB 12.0* 12.0*  HCT 35.6* 35.7*  PLT 166 170   BMET  Basename 01/07/12 0143 01/05/12 2005  NA 137 141  K 4.2 4.3  CL 105 109  CO2 25 23  GLUCOSE 81 127*  BUN 14 20  CREATININE 1.01 1.04  CALCIUM 8.9 8.4    Studies/Results: No results found.  Medications:  Scheduled:   . sodium chloride   Intravenous Once  . brimonidine  1 drop Left Eye BID  . insulin aspart  0-9 Units Subcutaneous TID WC  . ketorolac  1 drop Left Eye QID  . pantoprazole (PROTONIX) IV  40 mg Intravenous Q12H  . timolol  2 drop Both Eyes BID  . DISCONTD: famotidine (PEPCID) IV  40 mg Intravenous Q12H  . DISCONTD: glimepiride  3 mg Oral QAC breakfast  . DISCONTD: ketorolac  1 drop Left Eye QID    Assessment/Plan:  1. Lower GI bleed no evidence of further bleeding and hgb stable currently on liquid diet await GI input this am 2. DM good contro cbg 83-97 3. htn fair control  LOS: 2 days   Ginette Otto 01/07/2012, 7:53 AM

## 2012-01-07 NOTE — Progress Notes (Signed)
Patient had one large bright red bowel movement today.

## 2012-01-07 NOTE — Progress Notes (Signed)
Chad French 11:08 AM  Subjective: The patient has no complaints and no bowel movement since he's been to the floor and he confirmed he had a colonoscopy last year  Objective: Vital signs stable afebrile hemoglobin stable abdomen is soft nontender  Assessment: Probable diverticular bleeding seemingly stopped Plan: OK to advance to diet and if no further bleeding hopefully home soon and Dr. Madilyn Fireman happy to see back as an outpatient  [facility]

## 2012-01-08 LAB — CBC
MCH: 32.4 pg (ref 26.0–34.0)
MCV: 95.3 fL (ref 78.0–100.0)
Platelets: 173 10*3/uL (ref 150–400)
RBC: 3.61 MIL/uL — ABNORMAL LOW (ref 4.22–5.81)
RDW: 12.7 % (ref 11.5–15.5)
WBC: 7.8 10*3/uL (ref 4.0–10.5)

## 2012-01-08 MED ORDER — OMEPRAZOLE 20 MG PO CPDR: 20.0000 mg | DELAYED_RELEASE_CAPSULE | Freq: Every day | ORAL | Status: DC

## 2012-01-08 NOTE — Discharge Summary (Signed)
Physician Discharge Summary  Patient ID: Chad French MRN: [id number] DOB/AGE: 1921-09-12 78 y.o.  Admit date: 01/05/2012 Discharge date: 01/08/2012  Admission Diagnoses:   Lower GI Bleed  Discharge Diagnoses:  Principal Problem:  *GI bleed Active Problems:  HTN (hypertension)  HLD (hyperlipidemia)  Degenerative disc disease  Diabetes mellitus  Glaucoma  Anemia   Discharged Condition: good  Hospital Course:  GI bleed The patient was admitted with lower GI bleeding.  He was given protonix and had no nausea or vomiting.  During hospitalization he had spontaneously stopped bleeding on his own.  He previously had a colonoscopy in 2012. He was seen by Dr Madilyn Fireman who suggested possible GI study if bleeding continued. His hgb remained stable and was at a value of 11.7 on discharge. He is instructed to discontinue meloxicam and start over the counter prilosec.  DM control remained good in hospital and he will resume previous medicationj   Consults: GI  Significant Diagnostic Studies: labs: serial cbc  Treatments: IV hydration  Discharge Exam: Blood pressure 135/70, pulse 79, temperature 97.7 F (36.5 C), temperature source Oral, resp. rate 20, height 5\' 8"  (1.727 m), weight 82.464 kg (181 lb 12.8 oz), SpO2 97.00%. Cardio: regular rate and rhythm, S1, S2 normal, no murmur, click, rub or gallop GI: soft, non-tender; bowel sounds normal; no masses,  no organomegaly  Disposition: 01-Home or Self Care   Medication List  As of 01/08/2012  7:37 AM   STOP taking these medications         meloxicam 15 MG tablet         TAKE these medications         atorvastatin 20 MG tablet   Commonly known as: LIPITOR   Take 20 mg by mouth daily.      ferrous fumarate 325 (106 FE) MG Tabs   Commonly known as: HEMOCYTE - 106 mg FE   Take 1 tablet by mouth daily.      glimepiride 1 MG tablet   Commonly known as: AMARYL   Take 3 mg by mouth daily before breakfast.      ketorolac  0.4 % Soln   Commonly known as: ACULAR   1 drop 4 (four) times daily.      lisinopril 40 MG tablet   Commonly known as: PRINIVIL,ZESTRIL   Take 40 mg by mouth daily.      omega-3 acid ethyl esters 1 G capsule   Commonly known as: LOVAZA   Take 1 g by mouth daily.      omeprazole 20 MG capsule   Commonly known as: PRILOSEC   Take 1 capsule (20 mg total) by mouth daily.      timolol 0.5 % ophthalmic solution   Commonly known as: BETIMOL   Place 2 drops into both eyes 2 (two) times daily.             SignedGinette Otto 01/08/2012, 7:37 AM

## 2012-01-08 NOTE — Discharge Instructions (Signed)
Continue prilosec. See Dr Nehemiah Settle in 2 weeks

## 2013-11-27 ENCOUNTER — Inpatient Hospital Stay (HOSPITAL_COMMUNITY)
Admission: EM | Admit: 2013-11-27 | Discharge: 2013-12-02 | DRG: 195 | Disposition: A | Discharge status: Skilled Nursing Facility | Payer: Medicare Other | Attending: Internal Medicine | Admitting: Internal Medicine

## 2013-11-27 ENCOUNTER — Emergency Department (HOSPITAL_COMMUNITY): Payer: Medicare Other

## 2013-11-27 ENCOUNTER — Encounter (HOSPITAL_COMMUNITY): Payer: Self-pay | Admitting: Emergency Medicine

## 2013-11-27 DIAGNOSIS — R531 Weakness: Secondary | ICD-10-CM

## 2013-11-27 DIAGNOSIS — R062 Wheezing: Secondary | ICD-10-CM | POA: Diagnosis present

## 2013-11-27 DIAGNOSIS — E119 Type 2 diabetes mellitus without complications: Secondary | ICD-10-CM

## 2013-11-27 DIAGNOSIS — Z87891 Personal history of nicotine dependence: Secondary | ICD-10-CM

## 2013-11-27 DIAGNOSIS — IMO0002 Reserved for concepts with insufficient information to code with codable children: Secondary | ICD-10-CM | POA: Diagnosis present

## 2013-11-27 DIAGNOSIS — H409 Unspecified glaucoma: Secondary | ICD-10-CM | POA: Diagnosis present

## 2013-11-27 DIAGNOSIS — R29898 Other symptoms and signs involving the musculoskeletal system: Secondary | ICD-10-CM | POA: Diagnosis present

## 2013-11-27 DIAGNOSIS — E785 Hyperlipidemia, unspecified: Secondary | ICD-10-CM | POA: Diagnosis present

## 2013-11-27 DIAGNOSIS — R5383 Other fatigue: Secondary | ICD-10-CM

## 2013-11-27 DIAGNOSIS — J189 Pneumonia, unspecified organism: Principal | ICD-10-CM | POA: Diagnosis present

## 2013-11-27 DIAGNOSIS — R5381 Other malaise: Secondary | ICD-10-CM | POA: Diagnosis present

## 2013-11-27 DIAGNOSIS — I10 Essential (primary) hypertension: Secondary | ICD-10-CM | POA: Diagnosis present

## 2013-11-27 DIAGNOSIS — R509 Fever, unspecified: Secondary | ICD-10-CM | POA: Diagnosis present

## 2013-11-27 DIAGNOSIS — Z833 Family history of diabetes mellitus: Secondary | ICD-10-CM

## 2013-11-27 LAB — URINALYSIS, ROUTINE W REFLEX MICROSCOPIC
BILIRUBIN URINE: NEGATIVE
Glucose, UA: NEGATIVE mg/dL
Hgb urine dipstick: NEGATIVE
Ketones, ur: NEGATIVE mg/dL
LEUKOCYTES UA: NEGATIVE
NITRITE: NEGATIVE
PH: 7 (ref 5.0–8.0)
Protein, ur: 30 mg/dL — AB
SPECIFIC GRAVITY, URINE: 1.02 (ref 1.005–1.030)
Urobilinogen, UA: 1 mg/dL (ref 0.0–1.0)

## 2013-11-27 LAB — CBC WITH DIFFERENTIAL/PLATELET
BASOS ABS: 0 10*3/uL (ref 0.0–0.1)
BASOS PCT: 0 % (ref 0–1)
EOS PCT: 0 % (ref 0–5)
Eosinophils Absolute: 0 10*3/uL (ref 0.0–0.7)
HEMATOCRIT: 45.6 % (ref 39.0–52.0)
Hemoglobin: 15.4 g/dL (ref 13.0–17.0)
LYMPHS PCT: 7 % — AB (ref 12–46)
Lymphs Abs: 0.8 10*3/uL (ref 0.7–4.0)
MCH: 32.4 pg (ref 26.0–34.0)
MCHC: 33.8 g/dL (ref 30.0–36.0)
MCV: 95.8 fL (ref 78.0–100.0)
MONO ABS: 1.1 10*3/uL — AB (ref 0.1–1.0)
MONOS PCT: 9 % (ref 3–12)
Neutro Abs: 9.6 10*3/uL — ABNORMAL HIGH (ref 1.7–7.7)
Neutrophils Relative %: 83 % — ABNORMAL HIGH (ref 43–77)
Platelets: 171 10*3/uL (ref 150–400)
RBC: 4.76 MIL/uL (ref 4.22–5.81)
RDW: 12.8 % (ref 11.5–15.5)
WBC: 11.5 10*3/uL — ABNORMAL HIGH (ref 4.0–10.5)

## 2013-11-27 LAB — COMPREHENSIVE METABOLIC PANEL
ALBUMIN: 4 g/dL (ref 3.5–5.2)
ALK PHOS: 89 U/L (ref 39–117)
ALT: 19 U/L (ref 0–53)
AST: 26 U/L (ref 0–37)
BUN: 16 mg/dL (ref 6–23)
CO2: 25 mEq/L (ref 19–32)
Calcium: 9.8 mg/dL (ref 8.4–10.5)
Chloride: 102 mEq/L (ref 96–112)
Creatinine, Ser: 0.98 mg/dL (ref 0.50–1.35)
GFR calc Af Amer: 81 mL/min — ABNORMAL LOW (ref >90)
GFR calc non Af Amer: 70 mL/min — ABNORMAL LOW (ref >90)
Glucose, Bld: 137 mg/dL — ABNORMAL HIGH (ref 70–99)
POTASSIUM: 4.6 meq/L (ref 3.7–5.3)
SODIUM: 140 meq/L (ref 137–147)
TOTAL PROTEIN: 7.6 g/dL (ref 6.0–8.3)
Total Bilirubin: 0.6 mg/dL (ref 0.3–1.2)

## 2013-11-27 LAB — URINE MICROSCOPIC-ADD ON

## 2013-11-27 LAB — I-STAT CG4 LACTIC ACID, ED: LACTIC ACID, VENOUS: 1.96 mmol/L (ref 0.5–2.2)

## 2013-11-27 MED ORDER — LISINOPRIL 10 MG PO TABS
10.0000 mg | ORAL_TABLET | Freq: Every day | ORAL | Status: DC
  Administered 2013-11-28 – 2013-12-02 (×5): 10 mg via ORAL
  Filled 2013-11-27 (×5): qty 1

## 2013-11-27 MED ORDER — ONDANSETRON HCL 4 MG/2ML IJ SOLN: 4.0000 mg | Freq: Four times a day (QID) | INTRAMUSCULAR | Status: DC | PRN

## 2013-11-27 MED ORDER — ENOXAPARIN SODIUM 40 MG/0.4ML ~~LOC~~ SOLN
40.0000 mg | SUBCUTANEOUS | Status: DC
  Administered 2013-11-27 – 2013-12-01 (×4): 40 mg via SUBCUTANEOUS
  Filled 2013-11-27 (×6): qty 0.4

## 2013-11-27 MED ORDER — ALBUTEROL SULFATE (2.5 MG/3ML) 0.083% IN NEBU
2.5000 mg | INHALATION_SOLUTION | RESPIRATORY_TRACT | Status: DC | PRN
  Administered 2013-11-30 – 2013-12-02 (×3): 2.5 mg via RESPIRATORY_TRACT
  Filled 2013-11-27 (×4): qty 3

## 2013-11-27 MED ORDER — FERROUS FUMARATE 325 (106 FE) MG PO TABS
1.0000 | ORAL_TABLET | Freq: Every day | ORAL | Status: DC
  Administered 2013-11-28 – 2013-12-02 (×5): 106 mg via ORAL
  Filled 2013-11-27 (×6): qty 1

## 2013-11-27 MED ORDER — ACETAMINOPHEN 325 MG PO TABS
650.0000 mg | ORAL_TABLET | Freq: Once | ORAL | Status: AC
  Administered 2013-11-27: 650 mg via ORAL
  Filled 2013-11-27: qty 2

## 2013-11-27 MED ORDER — ACETAMINOPHEN 325 MG PO TABS
650.0000 mg | ORAL_TABLET | Freq: Four times a day (QID) | ORAL | Status: DC | PRN
  Administered 2013-11-27 – 2013-11-30 (×3): 650 mg via ORAL
  Filled 2013-11-27 (×3): qty 2

## 2013-11-27 MED ORDER — INSULIN ASPART 100 UNIT/ML ~~LOC~~ SOLN
0.0000 [IU] | Freq: Three times a day (TID) | SUBCUTANEOUS | Status: DC
  Administered 2013-11-28 – 2013-11-30 (×6): 1 [IU] via SUBCUTANEOUS
  Administered 2013-11-30: 2 [IU] via SUBCUTANEOUS
  Administered 2013-11-30: 3 [IU] via SUBCUTANEOUS
  Administered 2013-12-01: 1 [IU] via SUBCUTANEOUS
  Administered 2013-12-01: 2 [IU] via SUBCUTANEOUS

## 2013-11-27 MED ORDER — KETOROLAC TROMETHAMINE 0.4 % OP SOLN: 1.0000 [drp] | Freq: Every day | OPHTHALMIC | Status: DC

## 2013-11-27 MED ORDER — TIMOLOL HEMIHYDRATE 0.5 % OP SOLN
1.0000 [drp] | Freq: Two times a day (BID) | OPHTHALMIC | Status: DC
  Administered 2013-11-27 – 2013-12-02 (×9): 1 [drp] via OPHTHALMIC
  Filled 2013-11-27: qty 10

## 2013-11-27 MED ORDER — DOCUSATE SODIUM 100 MG PO CAPS
100.0000 mg | ORAL_CAPSULE | Freq: Two times a day (BID) | ORAL | Status: DC
Start: 2013-11-27 — End: 2013-12-02
  Administered 2013-11-27 – 2013-12-02 (×4): 100 mg via ORAL
  Filled 2013-11-27 (×11): qty 1

## 2013-11-27 MED ORDER — DEXTROSE 5 % IV SOLN
1.0000 g | Freq: Once | INTRAVENOUS | Status: AC
  Administered 2013-11-27: 1 g via INTRAVENOUS
  Filled 2013-11-27: qty 10

## 2013-11-27 MED ORDER — KETOROLAC TROMETHAMINE 0.5 % OP SOLN
1.0000 [drp] | Freq: Every day | OPHTHALMIC | Status: DC
  Administered 2013-11-28 – 2013-12-02 (×5): 1 [drp] via OPHTHALMIC
  Filled 2013-11-27: qty 3

## 2013-11-27 MED ORDER — DEXTROSE 5 % IV SOLN
500.0000 mg | INTRAVENOUS | Status: DC
  Filled 2013-11-27: qty 500

## 2013-11-27 MED ORDER — ONDANSETRON HCL 4 MG PO TABS: 4.0000 mg | ORAL_TABLET | Freq: Four times a day (QID) | ORAL | Status: DC | PRN

## 2013-11-27 MED ORDER — DEXTROSE 5 % IV SOLN
500.0000 mg | Freq: Once | INTRAVENOUS | Status: AC
  Administered 2013-11-27: 500 mg via INTRAVENOUS

## 2013-11-27 MED ORDER — DEXTROSE 5 % IV SOLN
1.0000 g | INTRAVENOUS | Status: DC
  Filled 2013-11-27: qty 10

## 2013-11-27 MED ORDER — SODIUM CHLORIDE 0.9 % IV SOLN
Freq: Once | INTRAVENOUS | Status: AC
  Administered 2013-11-27: 15:00:00 via INTRAVENOUS

## 2013-11-27 MED ORDER — ATORVASTATIN CALCIUM 20 MG PO TABS
20.0000 mg | ORAL_TABLET | Freq: Every day | ORAL | Status: DC
  Administered 2013-11-27 – 2013-11-30 (×4): 20 mg via ORAL
  Filled 2013-11-27 (×6): qty 1

## 2013-11-27 MED ORDER — SODIUM CHLORIDE 0.9 % IV SOLN
INTRAVENOUS | Status: AC
  Administered 2013-11-27: 19:00:00 via INTRAVENOUS

## 2013-11-27 MED ORDER — ACETAMINOPHEN 650 MG RE SUPP: 650.0000 mg | Freq: Four times a day (QID) | RECTAL | Status: DC | PRN

## 2013-11-27 NOTE — ED Notes (Signed)
Called to give report, nurse in pt room unable to get report, requested to call back.

## 2013-11-27 NOTE — H&P (Signed)
[facility] History and Physical  Chad French GQQ:[id number] DOB: Dec 02, 1921 DOA: 11/27/2013   PCP: Kandice Hams, MD  Specialists: Followed by Dr. Marlou Porch, with cardiology  Chief Complaint: Weakness of the legs  HPI: Chad French is a 80 y.o. male with a past medical history of diet controlled diabetes, hypertension, unknown heart disease, who lives with his wife and was in his usual state of health earlier today when he felt weak in his legs. He couldn't get up from his couch. He states that when he woke up this morning he was feeling normal. He went to his kitchen to make breakfast and ate in the kitchen. Then he went to the living room and sat on the couch. Started feeling weak and then he couldn't get up. Had to call EMS. He reports no history of fever, or cough. No sick contacts. Denies any nausea, vomiting, diarrhea. No dysuria. No chills. He has chronic back pain with no exacerbation of same. Denies any speech impairment. No swallowing difficulties. He admitted to the ED physician that he did have some shortness of breath this morning, but he didn't tell me the same. He denies any chest pain.  Home Medications: Prior to Admission medications   Medication Sig Start Date End Date Taking? Authorizing Provider  atorvastatin (LIPITOR) 20 MG tablet Take 20 mg by mouth daily.   Yes Historical Provider, MD  cholecalciferol (VITAMIN D) 400 UNITS TABS tablet Take 400 Units by mouth daily.   Yes Historical Provider, MD  ferrous fumarate (HEMOCYTE - 106 MG FE) 325 (106 FE) MG TABS Take 1 tablet by mouth daily.   Yes Historical Provider, MD  ketorolac (ACULAR) 0.4 % SOLN 1 drop daily. To left eye   Yes Historical Provider, MD  lisinopril (PRINIVIL,ZESTRIL) 40 MG tablet Take 10 mg by mouth daily.    Yes Historical Provider, MD  timolol (BETIMOL) 0.5 % ophthalmic solution Place 2 drops into both eyes 2 (two) times daily.   Yes Historical Provider, MD    Allergies: No Known  Allergies  Past Medical History: Past Medical History  Diagnosis Date  . Diverticulitis     with colonoscopy in 11/2010-Diverticula and polyp whioch was a non-malignant tubuular adenoma  . HTN (hypertension)   . HLD (hyperlipidemia)   . Degenerative disc disease   . Diabetes mellitus   . Obesity, Class III, BMI 40-49.9 (morbid obesity)   . Glaucoma     Past Surgical History  Procedure Laterality Date  . Colonoscopy  4/12    Social History: Patient lives with his wife. He ambulates using a cane. Denies smoking, alcohol or illicit drug use.  Family History:  Family History  Problem Relation Age of Onset  . Diabetes Mother   . Diabetes Maternal Grandmother   . Kidney disease Maternal Grandfather      Review of Systems - History obtained from the patient General ROS: positive for  - fatigue Psychological ROS: negative Ophthalmic ROS: negative ENT ROS: negative Allergy and Immunology ROS: negative Hematological and Lymphatic ROS: negative Endocrine ROS: negative Respiratory ROS: no cough, shortness of breath, or wheezing Cardiovascular ROS: no chest pain or dyspnea on exertion Gastrointestinal ROS: no abdominal pain, change in bowel habits, or black or bloody stools Genito-Urinary ROS: no dysuria, trouble voiding, or hematuria Musculoskeletal ROS: negative Neurological ROS: no TIA or stroke symptoms Dermatological ROS: negative  Physical Examination  Filed Vitals:   11/27/13 1341 11/27/13 1347 11/27/13 1354 11/27/13 1506  BP:  155/67  150/71  Pulse:  101  102  Temp:  100.3 F (37.9 C) 102.3 F (39.1 C)   TempSrc:  Oral Rectal   Resp:  22  24  SpO2: 94% 94%  100%    BP 150/71  Pulse 102  Temp(Src) 102.3 F (39.1 C) (Rectal)  Resp 24  SpO2 100%  General appearance: alert, cooperative, appears stated age and no distress Head: Normocephalic, without obvious abnormality, atraumatic Eyes: conjunctivae/corneas clear. PERRL, EOM's intact.  Throat: lips, mucosa,  and tongue normal; teeth and gums normal Neck: no adenopathy, no carotid bruit, no JVD, supple, symmetrical, trachea midline and thyroid not enlarged, symmetric, no tenderness/mass/nodules Resp: Crackles at the bases, left more than right. No wheezing. No rhonchi. Cardio: regular rate and rhythm, S1, S2 normal, no murmur, click, rub or gallop GI: soft, non-tender; bowel sounds normal; no masses,  no organomegaly Extremities: extremities normal, atraumatic, no cyanosis or edema Pulses: 2+ and symmetric Skin: Skin color, texture, turgor normal. No rashes or lesions Lymph nodes: Cervical, supraclavicular, and axillary nodes normal. Neurologic: Alert and oriented x3. Cranial nerve 2-12 are intact. Motor strength is 5/5 upper and lower extremities bilaterally. Gait was not assessed.  Laboratory Data: Results for orders placed during the hospital encounter of 11/27/13 (from the past 48 hour(s))  COMPREHENSIVE METABOLIC PANEL     Status: Abnormal   Collection Time    11/27/13  2:20 PM      Result Value Ref Range   Sodium 140  137 - 147 mEq/L   Potassium 4.6  3.7 - 5.3 mEq/L   Chloride 102  96 - 112 mEq/L   CO2 25  19 - 32 mEq/L   Glucose, Bld 137 (*) 70 - 99 mg/dL   BUN 16  6 - 23 mg/dL   Creatinine, Ser 0.98  0.50 - 1.35 mg/dL   Calcium 9.8  8.4 - 10.5 mg/dL   Total Protein 7.6  6.0 - 8.3 g/dL   Albumin 4.0  3.5 - 5.2 g/dL   AST 26  0 - 37 U/L   ALT 19  0 - 53 U/L   Alkaline Phosphatase 89  39 - 117 U/L   Total Bilirubin 0.6  0.3 - 1.2 mg/dL   GFR calc non Af Amer 70 (*) >90 mL/min   GFR calc Af Amer 81 (*) >90 mL/min   Comment: (NOTE)     The eGFR has been calculated using the CKD EPI equation.     This calculation has not been validated in all clinical situations.     eGFR's persistently <90 mL/min signify possible Chronic Kidney     Disease.  CBC WITH DIFFERENTIAL     Status: Abnormal   Collection Time    11/27/13  2:20 PM      Result Value Ref Range   WBC 11.5 (*) 4.0 - 10.5  K/uL   RBC 4.76  4.22 - 5.81 MIL/uL   Hemoglobin 15.4  13.0 - 17.0 g/dL   HCT 45.6  39.0 - 52.0 %   MCV 95.8  78.0 - 100.0 fL   MCH 32.4  26.0 - 34.0 pg   MCHC 33.8  30.0 - 36.0 g/dL   RDW 12.8  11.5 - 15.5 %   Platelets 171  150 - 400 K/uL   Neutrophils Relative % 83 (*) 43 - 77 %   Neutro Abs 9.6 (*) 1.7 - 7.7 K/uL   Lymphocytes Relative 7 (*) 12 - 46 %   Lymphs Abs 0.8  0.7 -  4.0 K/uL   Monocytes Relative 9  3 - 12 %   Monocytes Absolute 1.1 (*) 0.1 - 1.0 K/uL   Eosinophils Relative 0  0 - 5 %   Eosinophils Absolute 0.0  0.0 - 0.7 K/uL   Basophils Relative 0  0 - 1 %   Basophils Absolute 0.0  0.0 - 0.1 K/uL  I-STAT CG4 LACTIC ACID, ED     Status: None   Collection Time    11/27/13  2:36 PM      Result Value Ref Range   Lactic Acid, Venous 1.96  0.5 - 2.2 mmol/L  URINALYSIS, ROUTINE W REFLEX MICROSCOPIC     Status: Abnormal   Collection Time    11/27/13  2:53 PM      Result Value Ref Range   Color, Urine YELLOW  YELLOW   APPearance CLEAR  CLEAR   Specific Gravity, Urine 1.020  1.005 - 1.030   pH 7.0  5.0 - 8.0   Glucose, UA NEGATIVE  NEGATIVE mg/dL   Hgb urine dipstick NEGATIVE  NEGATIVE   Bilirubin Urine NEGATIVE  NEGATIVE   Ketones, ur NEGATIVE  NEGATIVE mg/dL   Protein, ur 30 (*) NEGATIVE mg/dL   Urobilinogen, UA 1.0  0.0 - 1.0 mg/dL   Nitrite NEGATIVE  NEGATIVE   Leukocytes, UA NEGATIVE  NEGATIVE  URINE MICROSCOPIC-ADD ON     Status: None   Collection Time    11/27/13  2:53 PM      Result Value Ref Range   Squamous Epithelial / LPF RARE  RARE   WBC, UA 0-2  <3 WBC/hpf   RBC / HPF 0-2  <3 RBC/hpf   Bacteria, UA RARE  RARE    Radiology Reports: Dg Chest 2 View  11/27/2013   CLINICAL DATA:  Fever and shortness of breath  EXAM: CHEST  2 VIEW  COMPARISON:  PA and lateral chest x-ray of December 01, 2010.  FINDINGS: The lungs are hypoinflated on the frontal film but better inflated on the lateral film. There is increased density on the lateral film projecting over the  thoracic spine which may reflect atelectasis or pneumonia in the superior segment of the left lower lobe. The cardiopericardial silhouette appears mildly enlarged. The central pulmonary vascularity is prominent. There is no evidence of a pleural effusion. The observed portions of the bony thorax appear normal.  IMPRESSION: The study is limited due to patient positioning. Increased density projecting over the mid thoracic spine on the lateral film likely corresponds to density in the retrocardiac region on the frontal film and may reflect pneumonia or atelectasis. An upright PA and lateral chest x-ray is recommended when the patient can tolerate the procedure.   Electronically Signed   By: David  Martinique   On: 11/27/2013 15:43    Electrocardiogram: Sinus tachycardia at 105 beats per minute. Left axis deviation. Nonspecific T wave changes. No concerning ST changes. Poor quality EKG.  Problem List  Principal Problem:   CAP (community acquired pneumonia) Active Problems:   HTN (hypertension)   Degenerative disc disease   Diabetes mellitus   Febrile illness   Assessment: This is a 80 year old, Caucasian male, who presents with weakness in his legs. There are no focal neurological deficits. However, he is noted to have a fever of 102F. Chest x-ray raises the possibility of retrocardiac infiltrate. Community-acquired pneumonia is a possibility. Viral syndrome is in the differential as well. His weakness appears to be mainly due to his fever rather than a  neurological process.  Plan: #1 community-acquired pneumonia versus viral syndrome: He'll be treated with ceftriaxone and azithromycin. Even, though it's late in the season we will check for influenza. Tylenol for fever. Monitor closely. Oxygen as needed.  #2 generalized weakness: He has good strength in both the lower extremities. No focal neurological deficits noted elsewhere. I think his weakness is probably from the febrile illness. We'll have  physical and occupational therapy evaluate him in the morning. Gentle IV hydration for now.  #3 diet-controlled diabetes mellitus: Sensitive Sliding scale coverage. CBGs will be checked.  DVT Prophylaxis: Lovenox Code Status: Full code Family Communication: Discussed with the patient  Disposition Plan: Admit to [place]. Dr. Seward Carol to assume care in the morning.   Further management decisions will depend on results of further testing and patient's response to treatment.   Bonnielee Haff  [facility] Pager [phone]  If 7PM-7AM, please contact night-coverage [email or web address] Password [facility]  11/27/2013, 4:36 PM

## 2013-11-27 NOTE — ED Notes (Signed)
Per EMS, Pt, from home, c/o increased lower extremity weakness and SOB w/ exertion starting around 0800.  Denies pain.  Pt reports that he is typically "a little weak," but became unable to walk w/o assistance earlier.

## 2013-11-27 NOTE — ED Provider Notes (Signed)
TIME SEEN: 3:00 PM  CHIEF COMPLAINT: Generalized weakness, shortness of breath  HPI: Patient is a 80 year old male history of hypertension, diabetes, hyperlipidemia who lives at home who presents emergency department with generalized weakness and shortness of breath that started this morning. In the emergency department, patient is a fever of 102.3. He denies feeling febrile at home. Denies any chest pain. No abdominal pain. No vomiting or diarrhea. He states that he normally ambulates with a cane but could not walk today due to his weakness. He is able to move his legs without difficulty while laying in bed but states he cannot support his weight. No new back pain, numbness or tingling, bowel or bladder incontinence.  ROS: See HPI Constitutional:  fever  Eyes: no drainage  ENT: no runny nose   Cardiovascular:  no chest pain  Resp: SOB  GI: no vomiting GU: no dysuria Integumentary: no rash  Allergy: no hives  Musculoskeletal: no leg swelling  Neurological: no slurred speech ROS otherwise negative  PAST MEDICAL HISTORY/PAST SURGICAL HISTORY:  Past Medical History  Diagnosis Date  . Diverticulitis     with colonoscopy in 11/2010-Diverticula and polyp whioch was a non-malignant tubuular adenoma  . HTN (hypertension)   . HLD (hyperlipidemia)   . Degenerative disc disease   . Diabetes mellitus   . Obesity, Class III, BMI 40-49.9 (morbid obesity)   . Glaucoma     MEDICATIONS:  Prior to Admission medications   Medication Sig Start Date End Date Taking? Authorizing Provider  atorvastatin (LIPITOR) 20 MG tablet Take 20 mg by mouth daily.   Yes Historical Provider, MD  cholecalciferol (VITAMIN D) 400 UNITS TABS tablet Take 400 Units by mouth daily.   Yes Historical Provider, MD  ferrous fumarate (HEMOCYTE - 106 MG FE) 325 (106 FE) MG TABS Take 1 tablet by mouth daily.   Yes Historical Provider, MD  ketorolac (A: PMCULAR) 0.4 % SOLN 1 drop daily. To left eye   Yes Historical Provider, MD   lisinopril (PRINIVIL,ZESTRIL) 40 MG tablet Take 10 mg by mouth daily.    Yes Historical Provider, MD  timolol (BETIMOL) 0.5 % ophthalmic solution Place 2 drops into both eyes 2 (two) times daily.   Yes Historical Provider, MD    ALLERGIES:  No Known Allergies  SOCIAL HISTORY:  History  Substance Use Topics  . Smoking status: Former Smoker -- 1.00 packs/day for 5 years    Quit date: 08/23/1971  . Smokeless tobacco: Not on file  . Alcohol Use: No    FAMILY HISTORY: Family History  Problem Relation Age of Onset  . Diabetes Mother   . Diabetes Maternal Grandmother   . Kidney disease Maternal Grandfather     EXAM: BP 155/67  Pulse 101  Temp(Src) 102.3 F (39.1 C) (Rectal)  Resp 22  SpO2 94% CONSTITUTIONAL: Alert and oriented and responds appropriately to questions. Well-appearing; well-nourished, elderly HEAD: Normocephalic EYES: Conjunctivae clear, PERRL ENT: normal nose; no rhinorrhea; moist mucous membranes; pharynx without lesions noted NECK: Supple, no meningismus, no LAD  CARD: Regular and tachycardic; S1 and S2 appreciated; no murmurs, no clicks, no rubs, no gallops RESP: Normal chest excursion without splinting or tachypnea; breath sounds clear and equal bilaterally; no wheezes, no rhonchi, no rales,  ABD/GI: Normal bowel sounds; non-distended; soft, non-tender, no rebound, no guarding BACK:  The back appears normal and is non-tender to palpation, there is no CVA tenderness EXT: Normal ROM in all joints; non-tender to palpation; no edema; normal capillary refill; no  cyanosis    SKIN: Normal color for age and race; warm NEURO: Moves all extremities equally, strength 5/5 in all 4 extremities, sensation to light touch intact diffusely, 2+ deep tendon reflexes bilateral upper and lower extremities  PSYCH: The patient's mood and manner are appropriate. Grooming and personal hygiene are appropriate.  MEDICAL DECISION MAKING: Patient here with generalized weakness and  fever. He states he feels that he is unable to stand but has no weakness on exam and has normal sensation and reflexes. No new back pain. Labs show mild leukocytosis with left shift. Urine is clean. Chest x-ray pending.  ED PROGRESS: Patient's lactate is normal. His chest x-ray shows a retrocardiac infiltrate. We'll give ceftriaxone and azithromycin to cover for community-acquired pneumonia. Given he is elderly with multiple comorbidities and is having difficulty walking at home, will admit. His PCP is Dr. Delfina [facility].    EKG Interpretation  Date/Time:  Wednesday November 27 2013 13:46:17 EDT Ventricular Rate:  101 PR Interval:  173 QRS Duration: 76 QT Interval:  334 QTC Calculation: 433 R Axis:   -42 Text Interpretation:  Age not entered, assumed to be  80 years old for purpose of ECG interpretation Ectopic atrial tachycardia, unifocal Left anterior fascicular block Anterior infarct, old Baseline wander in lead(s) II III aVF Confirmed by WARD,  DO, KRISTEN [id number]) on 11/27/2013 2:57:58 PM        [place], DO 11/27/13 1027

## 2013-11-27 NOTE — ED Notes (Signed)
Patient transported to X-ray 

## 2013-11-27 NOTE — ED Notes (Signed)
Bed: WA18 Expected date:  Expected time:  Means of arrival:  Comments: ems 

## 2013-11-27 NOTE — ED Notes (Signed)
MD at bedside. 

## 2013-11-28 ENCOUNTER — Inpatient Hospital Stay (HOSPITAL_COMMUNITY): Payer: Medicare Other

## 2013-11-28 LAB — URINE CULTURE
Colony Count: NO GROWTH
Culture: NO GROWTH

## 2013-11-28 LAB — COMPREHENSIVE METABOLIC PANEL
ALK PHOS: 71 U/L (ref 39–117)
ALT: 34 U/L (ref 0–53)
AST: 48 U/L — AB (ref 0–37)
Albumin: 3.2 g/dL — ABNORMAL LOW (ref 3.5–5.2)
BILIRUBIN TOTAL: 0.6 mg/dL (ref 0.3–1.2)
BUN: 15 mg/dL (ref 6–23)
CHLORIDE: 106 meq/L (ref 96–112)
CO2: 23 mEq/L (ref 19–32)
Calcium: 8.9 mg/dL (ref 8.4–10.5)
Creatinine, Ser: 0.96 mg/dL (ref 0.50–1.35)
GFR calc Af Amer: 82 mL/min — ABNORMAL LOW (ref >90)
GFR, EST NON AFRICAN AMERICAN: 71 mL/min — AB (ref >90)
Glucose, Bld: 129 mg/dL — ABNORMAL HIGH (ref 70–99)
Potassium: 4.2 mEq/L (ref 3.7–5.3)
Sodium: 141 mEq/L (ref 137–147)
Total Protein: 6.3 g/dL (ref 6.0–8.3)

## 2013-11-28 LAB — GLUCOSE, CAPILLARY
Glucose-Capillary: 204 mg/dL — ABNORMAL HIGH (ref 70–99)
Glucose-Capillary: 127 mg/dL — ABNORMAL HIGH (ref 70–99)
Glucose-Capillary: 96 mg/dL (ref 70–99)
Glucose-Capillary: 134 mg/dL — ABNORMAL HIGH (ref 70–99)

## 2013-11-28 LAB — CBC
HCT: 39.9 % (ref 39.0–52.0)
Hemoglobin: 13.4 g/dL (ref 13.0–17.0)
MCH: 32.4 pg (ref 26.0–34.0)
MCHC: 33.6 g/dL (ref 30.0–36.0)
MCV: 96.6 fL (ref 78.0–100.0)
PLATELETS: 123 10*3/uL — AB (ref 150–400)
RBC: 4.13 MIL/uL — ABNORMAL LOW (ref 4.22–5.81)
RDW: 12.9 % (ref 11.5–15.5)
WBC: 7.9 10*3/uL (ref 4.0–10.5)

## 2013-11-28 LAB — INFLUENZA PANEL BY PCR (TYPE A & B)
H1N1 flu by pcr: NOT DETECTED
INFLAPCR: NEGATIVE
Influenza B By PCR: NEGATIVE

## 2013-11-28 LAB — LEGIONELLA ANTIGEN, URINE: Legionella Antigen, Urine: NEGATIVE

## 2013-11-28 LAB — STREP PNEUMONIAE URINARY ANTIGEN: Strep Pneumo Urinary Antigen: NEGATIVE

## 2013-11-28 MED ORDER — HYDROCODONE-ACETAMINOPHEN 5-325 MG PO TABS: 1.0000 | ORAL_TABLET | Freq: Four times a day (QID) | ORAL | Status: DC | PRN

## 2013-11-28 MED ORDER — LIDOCAINE 5 % EX PTCH
1.0000 | MEDICATED_PATCH | Freq: Every day | CUTANEOUS | Status: DC
  Filled 2013-11-28 (×5): qty 1

## 2013-11-28 MED ORDER — LEVOFLOXACIN 500 MG PO TABS
500.0000 mg | ORAL_TABLET | Freq: Every day | ORAL | Status: DC
  Administered 2013-11-28 – 2013-12-02 (×5): 500 mg via ORAL
  Filled 2013-11-28 (×5): qty 1

## 2013-11-28 MED ORDER — BRIMONIDINE TARTRATE 0.2 % OP SOLN
1.0000 [drp] | Freq: Two times a day (BID) | OPHTHALMIC | Status: DC
  Administered 2013-11-28 – 2013-12-02 (×8): 1 [drp] via OPHTHALMIC
  Filled 2013-11-28: qty 5

## 2013-11-28 NOTE — Progress Notes (Signed)
Subjective: Patient admitted with fever, abnormal x-ray malaise. There was concern for pneumonia, he primarily had a complaint of malaise there was no fever or chills no productive cough.  Objective: Vital signs in last 24 hours: Temp:  [98.1 F (36.7 C)-102.3 F (39.1 C)] 99.5 F (37.5 C) (04/09 0935) Pulse Rate:  [81-102] 87 (04/09 0935) Resp:  [16-24] 18 (04/09 0935) BP: (120-159)/(66-72) 151/72 mmHg (04/09 0935) SpO2:  [94 %-100 %] 97 % (04/09 0935) Weight:  [76.8 kg (169 lb 5 oz)] 76.8 kg (169 lb 5 oz) (04/08 1800) Weight change:  Last BM Date: 11/28/13  Intake/Output from previous day: 04/08 0701 - 04/09 0700 In: 240 [P.O.:240] Out: -  Intake/Output this shift: Total I/O In: 240 [P.O.:240] Out: -   General appearance: alert and cooperative Resp: rhonchi left base Cardio: regular rate and rhythm, S1, S2 normal, no murmur, click, rub or gallop GI: soft, non-tender; bowel sounds normal; no masses,  no organomegaly Extremities: extremities normal, atraumatic, no cyanosis or edema  Lab Results:  Results for orders placed during the hospital encounter of 11/27/13 (from the past 24 hour(s))  CULTURE, BLOOD (ROUTINE X 2)     Status: None   Collection Time    11/27/13  2:15 PM      Result Value Ref Range   Specimen Description BLOOD LEFT ANTECUBITAL     Special Requests BOTTLES DRAWN AEROBIC AND ANAEROBIC 5ML     Culture  Setup Time       Value: 11/27/2013 21:21     Performed at [facility]   Culture       Value:        BLOOD CULTURE RECEIVED NO GROWTH TO DATE CULTURE WILL BE HELD FOR 5 DAYS BEFORE ISSUING A FINAL NEGATIVE REPORT     Performed at [facility]   Report Status PENDING    COMPREHENSIVE METABOLIC PANEL     Status: Abnormal   Collection Time    11/27/13  2:20 PM      Result Value Ref Range   Sodium 140  137 - 147 mEq/L   Potassium 4.6  3.7 - 5.3 mEq/L   Chloride 102  96 - 112 mEq/L   CO2 25  19 - 32 mEq/L   Glucose, Bld 137 (*) 70 - 99  mg/dL   BUN 16  6 - 23 mg/dL   Creatinine, Ser 0.98  0.50 - 1.35 mg/dL   Calcium 9.8  8.4 - 10.5 mg/dL   Total Protein 7.6  6.0 - 8.3 g/dL   Albumin 4.0  3.5 - 5.2 g/dL   AST 26  0 - 37 U/L   ALT 19  0 - 53 U/L   Alkaline Phosphatase 89  39 - 117 U/L   Total Bilirubin 0.6  0.3 - 1.2 mg/dL   GFR calc non Af Amer 70 (*) >90 mL/min   GFR calc Af Amer 81 (*) >90 mL/min  CBC WITH DIFFERENTIAL     Status: Abnormal   Collection Time    11/27/13  2:20 PM      Result Value Ref Range   WBC 11.5 (*) 4.0 - 10.5 K/uL   RBC 4.76  4.22 - 5.81 MIL/uL   Hemoglobin 15.4  13.0 - 17.0 g/dL   HCT 45.6  39.0 - 52.0 %   MCV 95.8  78.0 - 100.0 fL   MCH 32.4  26.0 - 34.0 pg   MCHC 33.8  30.0 - 36.0 g/dL  RDW 12.8  11.5 - 15.5 %   Platelets 171  150 - 400 K/uL   Neutrophils Relative % 83 (*) 43 - 77 %   Neutro Abs 9.6 (*) 1.7 - 7.7 K/uL   Lymphocytes Relative 7 (*) 12 - 46 %   Lymphs Abs 0.8  0.7 - 4.0 K/uL   Monocytes Relative 9  3 - 12 %   Monocytes Absolute 1.1 (*) 0.1 - 1.0 K/uL   Eosinophils Relative 0  0 - 5 %   Eosinophils Absolute 0.0  0.0 - 0.7 K/uL   Basophils Relative 0  0 - 1 %   Basophils Absolute 0.0  0.0 - 0.1 K/uL  CULTURE, BLOOD (ROUTINE X 2)     Status: None   Collection Time    11/27/13  2:30 PM      Result Value Ref Range   Specimen Description BLOOD L FA     Special Requests BOTTLES DRAWN AEROBIC AND ANAEROBIC 5ML     Culture  Setup Time       Value: 11/27/2013 21:21     Performed at [facility]   Culture       Value:        BLOOD CULTURE RECEIVED NO GROWTH TO DATE CULTURE WILL BE HELD FOR 5 DAYS BEFORE ISSUING A FINAL NEGATIVE REPORT     Performed at [facility]   Report Status PENDING    I-STAT CG4 LACTIC ACID, ED     Status: None   Collection Time    11/27/13  2:36 PM      Result Value Ref Range   Lactic Acid, Venous 1.96  0.5 - 2.2 mmol/L  URINALYSIS, ROUTINE W REFLEX MICROSCOPIC     Status: Abnormal   Collection Time    11/27/13  2:53 PM       Result Value Ref Range   Color, Urine YELLOW  YELLOW   APPearance CLEAR  CLEAR   Specific Gravity, Urine 1.020  1.005 - 1.030   pH 7.0  5.0 - 8.0   Glucose, UA NEGATIVE  NEGATIVE mg/dL   Hgb urine dipstick NEGATIVE  NEGATIVE   Bilirubin Urine NEGATIVE  NEGATIVE   Ketones, ur NEGATIVE  NEGATIVE mg/dL   Protein, ur 30 (*) NEGATIVE mg/dL   Urobilinogen, UA 1.0  0.0 - 1.0 mg/dL   Nitrite NEGATIVE  NEGATIVE   Leukocytes, UA NEGATIVE  NEGATIVE  URINE MICROSCOPIC-ADD ON     Status: None   Collection Time    11/27/13  2:53 PM      Result Value Ref Range   Squamous Epithelial / LPF RARE  RARE   WBC, UA 0-2  <3 WBC/hpf   RBC / HPF 0-2  <3 RBC/hpf   Bacteria, UA RARE  RARE  INFLUENZA PANEL BY PCR (TYPE A & B, H1N1)     Status: None   Collection Time    11/27/13  5:59 PM      Result Value Ref Range   Influenza A By PCR NEGATIVE  NEGATIVE   Influenza B By PCR NEGATIVE  NEGATIVE   H1N1 flu by pcr NOT DETECTED  NOT DETECTED  GLUCOSE, CAPILLARY     Status: Abnormal   Collection Time    11/27/13  6:45 PM      Result Value Ref Range   Glucose-Capillary 204 (*) 70 - 99 mg/dL   Comment 1 Notify RN    STREP PNEUMONIAE URINARY ANTIGEN     Status:  None   Collection Time    11/28/13  1:49 AM      Result Value Ref Range   Strep Pneumo Urinary Antigen NEGATIVE  NEGATIVE  COMPREHENSIVE METABOLIC PANEL     Status: Abnormal   Collection Time    11/28/13  5:42 AM      Result Value Ref Range   Sodium 141  137 - 147 mEq/L   Potassium 4.2  3.7 - 5.3 mEq/L   Chloride 106  96 - 112 mEq/L   CO2 23  19 - 32 mEq/L   Glucose, Bld 129 (*) 70 - 99 mg/dL   BUN 15  6 - 23 mg/dL   Creatinine, Ser 0.96  0.50 - 1.35 mg/dL   Calcium 8.9  8.4 - 10.5 mg/dL   Total Protein 6.3  6.0 - 8.3 g/dL   Albumin 3.2 (*) 3.5 - 5.2 g/dL   AST 48 (*) 0 - 37 U/L   ALT 34  0 - 53 U/L   Alkaline Phosphatase 71  39 - 117 U/L   Total Bilirubin 0.6  0.3 - 1.2 mg/dL   GFR calc non Af Amer 71 (*) >90 mL/min   GFR calc Af Amer 82  (*) >90 mL/min  CBC     Status: Abnormal   Collection Time    11/28/13  5:42 AM      Result Value Ref Range   WBC 7.9  4.0 - 10.5 K/uL   RBC 4.13 (*) 4.22 - 5.81 MIL/uL   Hemoglobin 13.4  13.0 - 17.0 g/dL   HCT 39.9  39.0 - 52.0 %   MCV 96.6  78.0 - 100.0 fL   MCH 32.4  26.0 - 34.0 pg   MCHC 33.6  30.0 - 36.0 g/dL   RDW 12.9  11.5 - 15.5 %   Platelets 123 (*) 150 - 400 K/uL  GLUCOSE, CAPILLARY     Status: Abnormal   Collection Time    11/28/13  8:28 AM      Result Value Ref Range   Glucose-Capillary 127 (*) 70 - 99 mg/dL      Studies/Results: Dg Chest 2 View  11/28/2013   CLINICAL DATA:  Pneumonia  EXAM: CHEST  2 VIEW  COMPARISON:  11/27/2013  FINDINGS: Cardiac enlargement without heart failure. Negative for pneumonia. Lungs remain clear. No effusion.  IMPRESSION: No active cardiopulmonary disease.   Electronically Signed   By: Franchot Gallo M.D.   On: 11/28/2013 09:54   Dg Chest 2 View  11/27/2013   CLINICAL DATA:  Fever and shortness of breath  EXAM: CHEST  2 VIEW  COMPARISON:  PA and lateral chest x-ray of December 01, 2010.  FINDINGS: The lungs are hypoinflated on the frontal film but better inflated on the lateral film. There is increased density on the lateral film projecting over the thoracic spine which may reflect atelectasis or pneumonia in the superior segment of the left lower lobe. The cardiopericardial silhouette appears mildly enlarged. The central pulmonary vascularity is prominent. There is no evidence of a pleural effusion. The observed portions of the bony thorax appear normal.  IMPRESSION: The study is limited due to patient positioning. Increased density projecting over the mid thoracic spine on the lateral film likely corresponds to density in the retrocardiac region on the frontal film and may reflect pneumonia or atelectasis. An upright PA and lateral chest x-ray is recommended when the patient can tolerate the procedure.   Electronically Signed   By: Shanon Brow  [place]    On: 11/27/2013 15:43    Medications:  Prior to Admission:  Prescriptions prior to admission  Medication Sig Dispense Refill  . atorvastatin (LIPITOR) 20 MG tablet Take 20 mg by mouth daily.      . cholecalciferol (VITAMIN D) 400 UNITS TABS tablet Take 400 Units by mouth daily.      . ferrous fumarate (HEMOCYTE - 106 MG FE) 325 (106 FE) MG TABS Take 1 tablet by mouth daily.      Marland Kitchen ketorolac (ACULAR) 0.4 % SOLN 1 drop daily. To left eye      . lisinopril (PRINIVIL,ZESTRIL) 40 MG tablet Take 10 mg by mouth daily.       . timolol (BETIMOL) 0.5 % ophthalmic solution Place 2 drops into both eyes 2 (two) times daily.       Scheduled: . atorvastatin  20 mg Oral q1800  . azithromycin  500 mg Intravenous Q24H  . cefTRIAXone (ROCEPHIN)  IV  1 g Intravenous Q24H  . docusate sodium  100 mg Oral BID  . enoxaparin (LOVENOX) injection  40 mg Subcutaneous Q24H  . ferrous fumarate  1 tablet Oral Daily  . insulin aspart  0-9 Units Subcutaneous TID WC  . ketorolac  1 drop Left Eye Daily  . lisinopril  10 mg Oral Daily  . timolol  1 drop Both Eyes BID   Continuous:  QHU:[id number], acetaminophen, albuterol, ondansetron (ZOFRAN) IV, ondansetron  Assessment/Plan: Fever malaise in an elderly male, initial x-ray questionable pneumonia, followup x-ray negative. Patient does have rhonchi in the left base. Currently he is without leukocytosis, since antibiotics have been started, he still has a low-grade temperature. Patient's UA is unremarkable he is without any wounds, other labs unremarkable. Test for influenza negative, blood cultures are pending. He's been seen by physical therapy, no needs identified. We will change antibiotics to oral continue to treat clinically as pneumonia , based on rhonchi on exam, fever and leukocytosis on presentation. Await blood cultures, Diabetes Hypertension History GI bleed due to diverticulosis, hemoglobin stable   LOS: 1 day   Chad French 11/28/2013, 11:47  AM

## 2013-11-28 NOTE — Progress Notes (Signed)
OT Cancellation Note  Patient Details Name: Chad French MRN: [id number] DOB: Nov 03, 1921   Cancelled Treatment:    Reason Eval/Treat Not Completed: Fatigue/lethargy limiting ability to participate.  Pt got up to chair earlier with PT and prefers not to work with OT until tomorrow as he is fatiqued.  Will check back then.  Lesle Chris 11/28/2013, 1:43 PM Lesle Chris, OTR/L [phone] 11/28/2013

## 2013-11-28 NOTE — Evaluation (Signed)
Physical Therapy Evaluation Patient Details Name: Chad MCHARGUE MRN: [id number] DOB: 11/09/1921 Today's Date: 11/28/2013   History of Present Illness  80 yo male admitted 11/27/13 wiith weakness, fever, N/V. Flu vs CAP  Clinical Impression  Pt assisted to recliner via RW. Patient reports legs feel weak. Pt will benefit from PT to address problems listed  Below. Pt will benefit from HHPT vs SNF rehab if weakness continues.     Follow Up Recommendations Home health PT;Supervision/Assistance - 24 hour (vs snf)    Equipment Recommendations  None recommended by PT    Recommendations for Other Services       Precautions / Restrictions Precautions Precautions: Fall      Mobility  Bed Mobility Overal bed mobility: Needs Assistance Bed Mobility: Supine to Sit     Supine to sit: Min assist;HOB elevated     General bed mobility comments: use pof rails to get to up right and pull self to edge.  Transfers Overall transfer level: Needs assistance Equipment used: Rolling walker (2 wheeled) Transfers: Sit to/from Stand Sit to Stand: Min assist;From elevated surface         General transfer comment: cues for safety  Ambulation/Gait Ambulation/Gait assistance: Mod assist Ambulation Distance (Feet): 5 Feet Assistive device: Rolling walker (2 wheeled) Gait Pattern/deviations: Step-to pattern     General Gait Details: cues for safety, ise of RW  Stairs            Wheelchair Mobility    Modified Rankin (Stroke Patients Only)       Balance Overall balance assessment: Needs assistance Sitting-balance support: No upper extremity supported;Feet supported Sitting balance-Leahy Scale: Fair     Standing balance support: Bilateral upper extremity supported;During functional activity Standing balance-Leahy Scale: Fair                               Pertinent Vitals/Pain Pt with DOE but sats 96% RA    Home Living Family/patient expects to be  discharged to:: Private residence Living Arrangements: Spouse/significant other Available Help at Discharge: Family Type of Home: House Home Access: Stairs to enter Entrance Stairs-Rails: Psychiatric nurse of Steps: 3 Home Layout: Two level Home Equipment: Environmental consultant - 2 wheels      Prior Function Level of Independence: Independent         Comments: drives, shops for groceries.     Hand Dominance        Extremity/Trunk Assessment   Upper Extremity Assessment: Defer to OT evaluation           Lower Extremity Assessment: Generalized weakness         Communication   Communication: No difficulties  Cognition Arousal/Alertness: Awake/alert Behavior During Therapy: WFL for tasks assessed/performed Overall Cognitive Status: Within Functional Limits for tasks assessed                      General Comments      Exercises General Exercises - Lower Extremity Long Arc Quad: AROM;Both;10 reps;Seated Hip ABduction/ADduction: AROM;Both;10 reps;Seated Hip Flexion/Marching: AROM;Both;10 reps;Seated Toe Raises: AROM;Seated;Both;10 reps Heel Raises: AROM;Seated;Both;10 reps      Assessment/Plan    PT Assessment Patient needs continued PT services  PT Diagnosis Difficulty walking;Generalized weakness   PT Problem List Decreased strength;Decreased activity tolerance;Decreased balance;Decreased mobility;Decreased knowledge of precautions;Decreased knowledge of use of DME;Decreased safety awareness  PT Treatment Interventions DME instruction;Gait training;Functional mobility training;Therapeutic activities;Therapeutic exercise;Stair training;Patient/family education  PT Goals (Current goals can be found in the Care Plan section) Acute Rehab PT Goals Patient Stated Goal: I want to get my strength back. PT Goal Formulation: With patient Time For Goal Achievement: 12/12/13 Potential to Achieve Goals: Good    Frequency Min 3X/week   Barriers to  discharge        Co-evaluation               End of Session   Activity Tolerance: Patient tolerated treatment well;Patient limited by fatigue Patient left: in chair;with chair alarm set;with call bell/phone within reach Nurse Communication: Mobility status         Time: 1005-1030 PT Time Calculation (min): 25 min   Charges:   PT Evaluation $Initial PT Evaluation Tier I: 1 Procedure PT Treatments $Gait Training: 8-22 mins $Therapeutic Exercise: 8-22 mins   PT G Codes:          Claretha Cooper 11/28/2013, 10:49 AM Tresa Endo PT [phone]

## 2013-11-28 NOTE — Progress Notes (Signed)
Patient verbalized there are some medications missing, notified pharmacy, will come and verify medications with patient

## 2013-11-28 NOTE — Progress Notes (Signed)
Clinical Social Work Department BRIEF PSYCHOSOCIAL ASSESSMENT 11/28/2013  Patient:  Chad French, Chad French     Account Number:  [id number]     Admit date:  11/27/2013  Clinical Social Worker:  Earlie Server  Date/Time:  11/28/2013 09:45 AM  Referred by:  RN  Date Referred:  11/28/2013 Referred for  SNF Placement   Other Referral:   Interview type:  Patient Other interview type:    PSYCHOSOCIAL DATA Living Status:  FAMILY Admitted from facility:   Level of care:   Primary support name:  Joycelyn Schmid Primary support relationship to patient:  SPOUSE Degree of support available:   Strong    CURRENT CONCERNS Current Concerns  Post-Acute Placement   Other Concerns:    SOCIAL WORK ASSESSMENT / PLAN CSW received referral during progression meeting due to patient being admitted with increasing weakness. CSW reviewed chart and met with patient at bedside. CSW introduced myself and explained role.    Patient reports that he lives at home with his wife. He and wife ambulate around the house with canes and walkers. Patient reports that he was feeling weaker and came to the hospital. Patient reports that dtr lives in [place] and visits them once a week to assist as needed but they do not have any other support. Patient is not active with [facility] but reports that wife has had [facility] in the past and has been to SNF in the past. CSW explained that MD ordered PT to evaluate patient to determine needs. Patient reports he is agreeable to [facility] but does not want to go to SNF because he does not want to leave his wife.    CSW agreeable to follow up with patient after PT evaluation to further discuss recommendations and to assist with DC planning.   Assessment/plan status:  Psychosocial Support/Ongoing Assessment of Needs Other assessment/ plan:   Information/referral to community resources:   SNF vs [facility] information    PATIENT'S/FAMILY'S RESPONSE TO PLAN OF CARE: Patient alert and oriented. Patient laying in  bed and reports he wants to feel better in order to go home and see his wife. Patient reports he is a strong individual and is very hopeful to DC home. Patient's wife at [facility] and although patient reports good service at [facility], patient feels he can manage at home. Patient agreeable for CSW to follow up after PT evaluates to further discuss plans.       [place], [place] [phone]

## 2013-11-29 LAB — GLUCOSE, CAPILLARY
GLUCOSE-CAPILLARY: 148 mg/dL — AB (ref 70–99)
GLUCOSE-CAPILLARY: 154 mg/dL — AB (ref 70–99)
Glucose-Capillary: 225 mg/dL — ABNORMAL HIGH (ref 70–99)
Glucose-Capillary: 132 mg/dL — ABNORMAL HIGH (ref 70–99)
Glucose-Capillary: 150 mg/dL — ABNORMAL HIGH (ref 70–99)

## 2013-11-29 NOTE — Progress Notes (Signed)
Infection Control states may d/c droplet precautions

## 2013-11-29 NOTE — Progress Notes (Addendum)
Clinical Social Work Department CLINICAL SOCIAL WORK PLACEMENT NOTE 11/29/2013  Patient:  Chad French, Chad French  Account Number:  [id number] Admit date:  11/27/2013  Clinical Social Worker:  Sindy Messing, LCSW  Date/time:  11/29/2013 10:00 AM  Clinical Social Work is seeking post-discharge placement for this patient at the following level of care:   [place]   (*CSW will update this form in Epic as items are completed)   11/29/2013  Patient/family provided with [place] Department of Clinical Social Work's list of facilities offering this level of care within the geographic area requested by the patient (or if unable, by the patient's family).  11/29/2013  Patient/family informed of their freedom to choose among providers that offer the needed level of care, that participate in Medicare, Medicaid or managed care program needed by the patient, have an available bed and are willing to accept the patient.  11/29/2013  Patient/family informed of [facility]' ownership interest in [facility], as well as of the fact that they are under no obligation to receive care at this facility.  PASARR submitted to EDS on 11/29/2013 PASARR number received from EDS on 11/29/2013  FL2 transmitted to all facilities in geographic area requested by pt/family on  11/29/2013 FL2 transmitted to all facilities within larger geographic area on   Patient informed that his/her managed care company has contracts with or will negotiate with  certain facilities, including the following:     Patient/family informed of bed offers received:  11/29/13 Patient chooses bed at [facility] Physician recommends and patient chooses bed at    Patient to be transferred to [facility] on  12/02/13 Patient to be transferred to facility by [facility]  The following physician request were entered in Epic:   Additional Comments:

## 2013-11-29 NOTE — Evaluation (Signed)
Occupational Therapy Evaluation Patient Details Name: Chad French MRN: [id number] DOB: 1922/03/22 Today's Date: 11/29/2013    History of Present Illness 80 yo male admitted 11/27/13 wiith weakness, fever, N/V. Flu vs CAP   Clinical Impression   Pt was admitted for the above.  At baseline, he is independent with adls and drives.  He currently needs min A for ambulation and sit to stand and up to max A for LB adls.  Pt does have AE at home which he didn't need to use.  We will utilize this during his acute stay.  Goals in acute are for supervision level.      Follow Up Recommendations  SNF (pt really wants home, if home, then [facility])    Equipment Recommendations   (HHOT to further assess for tub DME)    Recommendations for Other Services       Precautions / Restrictions Precautions Precautions: Fall Restrictions Weight Bearing Restrictions: No      Mobility Bed Mobility                  Transfers     Transfers: Sit to/from Stand Sit to Stand: Min assist         General transfer comment: cues for hand position; from chair and commode    Balance                                            ADL Overall ADL's : Needs assistance/impaired     Grooming: Min guard;Standing;Wash/dry hands   Upper Body Bathing: Set up;Sitting   Lower Body Bathing: Moderate assistance;Sit to/from stand   Upper Body Dressing : Set up;Sitting   Lower Body Dressing: Maximal assistance;Sit to/from stand   Toilet Transfer: Minimal assistance;Ambulation;BSC   Toileting- Clothing Manipulation and Hygiene: Minimal assistance;Sit to/from stand       Functional mobility during ADLs: Minimal assistance (Pt very shaky walking back from bathroom) General ADL Comments: ambulated to bathroom with min A.  Pt is not used to using a walker:  cues for distance and hand position with transitions.  He states he has a Secondary school teacher and sock aide at home but he hasn't had to use  them.  He helps his wife get positioned at night--otherwise, she does own adls.       Vision                     Perception     Praxis      Pertinent Vitals/Pain No c/o pain.  2/4 dyspnea with activity     Hand Dominance     Extremity/Trunk Assessment Upper Extremity Assessment Upper Extremity Assessment: Generalized weakness           Communication Communication Communication: No difficulties   Cognition Arousal/Alertness: Awake/alert Behavior During Therapy: WFL for tasks assessed/performed Overall Cognitive Status: Within Functional Limits for tasks assessed                     General Comments       Exercises       Shoulder Instructions      Home Living Family/patient expects to be discharged to:: Private residence Living Arrangements: Spouse/significant other Available Help at Discharge: Family Type of Home: House             Bathroom Shower/Tub: Tub/shower unit Shower/tub characteristics: Theatre stage manager  Toilet: Standard     Home Equipment: Toilet riser;Walker - 2 wheels          Prior Functioning/Environment Level of Independence: Independent        Comments: drives, shops for groceries.    OT Diagnosis: Generalized weakness   OT Problem List: Decreased strength;Decreased activity tolerance;Impaired balance (sitting and/or standing);Decreased knowledge of use of DME or AE   OT Treatment/Interventions: Self-care/ADL training;DME and/or AE instruction;Balance training;Patient/family education;Energy conservation    OT Goals(Current goals can be found in the care plan section) Acute Rehab OT Goals Patient Stated Goal: get strength back and go home OT Goal Formulation: With patient Time For Goal Achievement: 12/13/13 Potential to Achieve Goals: Good ADL Goals Pt Will Perform Grooming: with supervision;standing Pt Will Perform Lower Body Bathing: with supervision;with adaptive equipment;sit to/from stand Pt Will  Perform Lower Body Dressing: with supervision;with adaptive equipment;sit to/from stand Pt Will Transfer to Toilet: with supervision;ambulating;bedside commode Pt Will Perform Toileting - Clothing Manipulation and hygiene: with supervision;sit to/from stand  OT Frequency: Min 2X/week   Barriers to D/C:            Co-evaluation              End of Session    Activity Tolerance: Patient limited by fatigue Patient left: with call bell/phone within reach;in chair;with chair alarm set   Time: 6073-7106 OT Time Calculation (min): 23 min Charges:  OT General Charges $OT Visit: 1 Procedure OT Evaluation $Initial OT Evaluation Tier I: 1 Procedure OT Treatments $Self Care/Home Management : 8-22 mins G-Codes:    Lesle Chris December 02, 2013, 8:57 AM  Lesle Chris, OTR/L [phone] 2013/12/02

## 2013-11-29 NOTE — Progress Notes (Signed)
Clinical Social Work  CSW met with Chad French and dtr Chad French) at bedside. CSW introduced myself to dtr and Chad French agreeable for dtr to be involved during session. CSW spoke in further detail about PT/OT recommendations for SNF placement at DC. Chad French reports he does not want to leave his wife and is worried about her being home alone. CSW spoke with Chad French about the benefits of SNF and ensuring his safety at DC. Dtr reports that family can make plans to ensure wife is safe as well. Chad French agreeable to SNF and prefers [facility] since it is close to home and is familiar with facility.   CSW completed FL2 and faxed out. CSW spoke with [facility] who will review information as well.  [place], [place] [phone]

## 2013-11-29 NOTE — Progress Notes (Signed)
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  Encouraged him to get out of bed to recliner today and to work with the PT staff to assess his abilities and make recommendation for preparing for a safe discharge home/snf.

## 2013-11-29 NOTE — Progress Notes (Signed)
Clinical Social Work  [facility] to accept patient. CSW informed patient and dtr that [facility] could accept when medically stable and patient is happy that his first choice is available.  CSW will continue to follow.  [place], [place] [phone]

## 2013-11-29 NOTE — Progress Notes (Signed)
Assessment/Plan: Principal Problem:   CAP (community acquired pneumonia) - we don't have convincing evidence of pneumonia, but I do think this is a resp infection. I will continue Levaquin. Remove droplet precautions (flu test negative). Up in chair more today. Will d/c home when strong enough to be safe.  Active Problems:   HTN (hypertension) - BP is fine   Degenerative disc disease   Diabetes mellitus - sugars are reasonably well controlled.    Febrile illness   Subjective: Feels a bit better but still not up to par. Felt pretty weak yesterday. Ability to work with therapies was limited.   Objective:  Vital Signs: Filed Vitals:   11/28/13 0611 11/28/13 0935 11/28/13 1640 11/28/13 2219  BP: 159/70 151/72 164/71 131/71  Pulse: 81 87 92 100  Temp: 101.1 F (38.4 C) 99.5 F (37.5 C) 98.1 F (36.7 C) 98.5 F (36.9 C)  TempSrc: Oral Oral Oral Oral  Resp: 20 18 18 18   Height:      Weight:      SpO2: 95% 97% 91% 90%     EXAM: Bilateral wheeze, end-expiratory.    Intake/Output Summary (Last 24 hours) at 11/29/13 0619 Last data filed at 11/28/13 2225  Gross per 24 hour  Intake    240 ml  Output    500 ml  Net   -260 ml    Lab Results:  Recent Labs  11/27/13 1420 11/28/13 0542  NA 140 141  K 4.6 4.2  CL 102 106  CO2 25 23  GLUCOSE 137* 129*  BUN 16 15  CREATININE 0.98 0.96  CALCIUM 9.8 8.9    Recent Labs  11/27/13 1420 11/28/13 0542  AST 26 48*  ALT 19 34  ALKPHOS 89 71  BILITOT 0.6 0.6  PROT 7.6 6.3  ALBUMIN 4.0 3.2*   No results found for this basename: LIPASE, AMYLASE,  in the last 72 hours  Recent Labs  11/27/13 1420 11/28/13 0542  WBC 11.5* 7.9  NEUTROABS 9.6*  --   HGB 15.4 13.4  HCT 45.6 39.9  MCV 95.8 96.6  PLT 171 123*   No results found for this basename: CKTOTAL, CKMB, CKMBINDEX, TROPONINI,  in the last 72 hours BNP No results found for this basename: probnp   No results found for this basename: DDIMER,  in the last 72 hours No  results found for this basename: HGBA1C,  in the last 72 hours No results found for this basename: CHOL, HDL, LDLCALC, TRIG, CHOLHDL, LDLDIRECT,  in the last 72 hours No results found for this basename: TSH, T4TOTAL, FREET3, T3FREE, THYROIDAB,  in the last 72 hours No results found for this basename: VITAMINB12, FOLATE, FERRITIN, TIBC, IRON, RETICCTPCT,  in the last 72 hours  Studies/Results: Dg Chest 2 View  11/28/2013   CLINICAL DATA:  Pneumonia  EXAM: CHEST  2 VIEW  COMPARISON:  11/27/2013  FINDINGS: Cardiac enlargement without heart failure. Negative for pneumonia. Lungs remain clear. No effusion.  IMPRESSION: No active cardiopulmonary disease.   Electronically Signed   By: Franchot Gallo M.D.   On: 11/28/2013 09:54   Dg Chest 2 View  11/27/2013   CLINICAL DATA:  Fever and shortness of breath  EXAM: CHEST  2 VIEW  COMPARISON:  PA and lateral chest x-ray of December 01, 2010.  FINDINGS: The lungs are hypoinflated on the frontal film but better inflated on the lateral film. There is increased density on the lateral film projecting over the thoracic spine which may reflect  atelectasis or pneumonia in the superior segment of the left lower lobe. The cardiopericardial silhouette appears mildly enlarged. The central pulmonary vascularity is prominent. There is no evidence of a pleural effusion. The observed portions of the bony thorax appear normal.  IMPRESSION: The study is limited due to patient positioning. Increased density projecting over the mid thoracic spine on the lateral film likely corresponds to density in the retrocardiac region on the frontal film and may reflect pneumonia or atelectasis. An upright PA and lateral chest x-ray is recommended when the patient can tolerate the procedure.   Electronically Signed   By: David  Martinique   On: 11/27/2013 15:43   Medications: Medications administered in the last 24 hours reviewed.  Current Medication List reviewed.    LOS: 2 days   Tyrone Schimke Internal Medicine @ [facility] [phone]) 11/29/2013, 6:19 AM

## 2013-11-29 NOTE — Progress Notes (Signed)
Physical Therapy Treatment Patient Details Name: Chad French MRN: [id number] DOB: 02/06/1922 Today's Date: 11/29/2013    History of Present Illness 80 yo male admitted 11/27/13 wiith weakness, fever, N/V. Flu vs CAP    PT Comments    Pt OOB in recliner.  Required several attempts to get self up from recliner.  Amb limited distance due to weakness/fatigue.  Assisted to BR due to loose stools (incont) and required assist for hygiene.   Prior pt was home taking care of his spouse/house and going to grocery store once a week.  Pt is significantly weaker and demon limited activity tolerance in comparison to his prior level.  Pt will need ST Rehab at SNF.  Follow Up Recommendations  SNF     Equipment Recommendations       Recommendations for Other Services       Precautions / Restrictions Precautions Precautions: Fall Restrictions Weight Bearing Restrictions: No    Mobility  Bed Mobility               General bed mobility comments: Pt OOB in recliner  Transfers Overall transfer level: Needs assistance Equipment used: Rolling walker (2 wheeled) Transfers: Sit to/from Stand Sit to Stand: Min assist         General transfer comment: pt required x 3 attempts to self rise to standing due to weakness.  Pt stated prior he could easily rise from his couch.  Required increased time and VC's for safety to complete turn prior to sit as pt attempted to sit prior fully reaching recliner due to fatigue.    Ambulation/Gait Ambulation/Gait assistance: Min assist Ambulation Distance (Feet): 50 Feet (25' x 2 one sitting rest6 break) Assistive device: Rolling walker (2 wheeled) Gait Pattern/deviations: Step-to pattern;Trunk flexed Gait velocity: decreased   General Gait Details: increased, increased time with poor flex posture (trunk/hips/knees) Limited activity tolerance/fatigues quickly. Poor self corrective reaction indicating HIGH FALL RISK.   Stairs             Wheelchair Mobility    Modified Rankin (Stroke Patients Only)       Balance                                    Cognition                            Exercises      General Comments        Pertinent Vitals/Pain     Home Living                      Prior Function            PT Goals (current goals can now be found in the care plan section) Progress towards PT goals: Progressing toward goals    Frequency  Min 3X/week    PT Plan      Co-evaluation             End of Session Equipment Utilized During Treatment: Gait belt Activity Tolerance: Patient limited by fatigue Patient left: in chair;with call bell/phone within reach;with chair alarm set     Time: 0272-5366 PT Time Calculation (min): 30 min  Charges:  $Gait Training: 8-22 mins $Therapeutic Activity: 8-22 mins  G Codes:      Rica Koyanagi  PTA [facility]  Acute  Rehab Pager      [phone]

## 2013-11-30 LAB — GLUCOSE, CAPILLARY
GLUCOSE-CAPILLARY: 131 mg/dL — AB (ref 70–99)
GLUCOSE-CAPILLARY: 148 mg/dL — AB (ref 70–99)
Glucose-Capillary: 199 mg/dL — ABNORMAL HIGH (ref 70–99)
Glucose-Capillary: 201 mg/dL — ABNORMAL HIGH (ref 70–99)

## 2013-11-30 NOTE — Progress Notes (Signed)
Assessment/Plan: Principal Problem:   CAP (community acquired pneumonia) - is improving (though I am not positive this was dx; clearly some type of resp infection). Will plan on d/c to [facility] on Monday. Will try to stop O2.  Active Problems:   HTN (hypertension)   Degenerative disc disease   Diabetes mellitus   Febrile illness   Subjective: Doing well. Was up most of the day. No real issues. PT notes reviewed and SNF planned.   Objective:  Vital Signs: Filed Vitals:   11/28/13 2219 11/29/13 1353 11/29/13 2226 11/30/13 0513  BP: 131/71 114/73 147/81 151/77  Pulse: 100 89 72 86  Temp: 98.5 F (36.9 C) 98.4 F (36.9 C) 98.1 F (36.7 C) 99.4 F (37.4 C)  TempSrc: Oral Oral Oral Oral  Resp: 18 19 20 22   Height:      Weight:      SpO2: 90% 94% 92% 97%     EXAM: Clear on left side; scattered wheezing right side (less than before)   Intake/Output Summary (Last 24 hours) at 11/30/13 0915 Last data filed at 11/29/13 2300  Gross per 24 hour  Intake      0 ml  Output    200 ml  Net   -200 ml    Lab Results:  Recent Labs  11/27/13 1420 11/28/13 0542  NA 140 141  K 4.6 4.2  CL 102 106  CO2 25 23  GLUCOSE 137* 129*  BUN 16 15  CREATININE 0.98 0.96  CALCIUM 9.8 8.9    Recent Labs  11/27/13 1420 11/28/13 0542  AST 26 48*  ALT 19 34  ALKPHOS 89 71  BILITOT 0.6 0.6  PROT 7.6 6.3  ALBUMIN 4.0 3.2*   No results found for this basename: LIPASE, AMYLASE,  in the last 72 hours  Recent Labs  11/27/13 1420 11/28/13 0542  WBC 11.5* 7.9  NEUTROABS 9.6*  --   HGB 15.4 13.4  HCT 45.6 39.9  MCV 95.8 96.6  PLT 171 123*   No results found for this basename: CKTOTAL, CKMB, CKMBINDEX, TROPONINI,  in the last 72 hours BNP No results found for this basename: probnp   No results found for this basename: DDIMER,  in the last 72 hours No results found for this basename: HGBA1C,  in the last 72 hours No results found for this basename: CHOL, HDL, LDLCALC, TRIG,  CHOLHDL, LDLDIRECT,  in the last 72 hours No results found for this basename: TSH, T4TOTAL, FREET3, T3FREE, THYROIDAB,  in the last 72 hours No results found for this basename: VITAMINB12, FOLATE, FERRITIN, TIBC, IRON, RETICCTPCT,  in the last 72 hours  Studies/Results: Dg Chest 2 View  11/28/2013   CLINICAL DATA:  Pneumonia  EXAM: CHEST  2 VIEW  COMPARISON:  11/27/2013  FINDINGS: Cardiac enlargement without heart failure. Negative for pneumonia. Lungs remain clear. No effusion.  IMPRESSION: No active cardiopulmonary disease.   Electronically Signed   By: Franchot Gallo M.D.   On: 11/28/2013 09:54   Medications: Medications administered in the last 24 hours reviewed.  Current Medication List reviewed.    LOS: 3 days   Tyrone Schimke Internal Medicine @ [facility] [phone]) 11/30/2013, 9:15 AM

## 2013-11-30 NOTE — Progress Notes (Signed)
Met with Pt's daughter and answered questions re: SNF placement.  Daughter curious to know if Pt can transport to [facility].  Spoke with Ivin Booty, Admissions Director at [facility].  Per Ivin Booty, facility cannot accept Pt tomorrow, as d/c summary was not received on Friday.  Relayed information to Pt's daughter.  Weekday CSW to follow.  Bernita Raisin, [place] Work [phone]

## 2013-12-01 LAB — GLUCOSE, CAPILLARY
GLUCOSE-CAPILLARY: 148 mg/dL — AB (ref 70–99)
Glucose-Capillary: 117 mg/dL — ABNORMAL HIGH (ref 70–99)
Glucose-Capillary: 198 mg/dL — ABNORMAL HIGH (ref 70–99)
Glucose-Capillary: 143 mg/dL — ABNORMAL HIGH (ref 70–99)

## 2013-12-01 NOTE — Progress Notes (Signed)
Assessment/Plan: Principal Problem:   CAP (community acquired pneumonia) - continue abx for presumed dx. In addition, he has had some wheezing (but no h/o asthma) and is better with prn albuterol. Plan d/c to [facility] in AM.  Active Problems:   HTN (hypertension)   Degenerative disc disease   Diabetes mellitus   Febrile illness   Subjective: Feeling much better. Used some albuterol which really helped some tightness he had in his chest.   Objective:  Vital Signs: Filed Vitals:   11/30/13 1421 11/30/13 2111 12/01/13 0536 12/01/13 0728  BP: 129/78 128/78 133/69   Pulse: 75 88 85   Temp: 98.1 F (36.7 C) 99.8 F (37.7 C) 99.8 F (37.7 C)   TempSrc: Oral Oral Oral   Resp: 20 20 20    Height:      Weight:      SpO2: 96% 93% 99% 91%     EXAM: No wheeze   Intake/Output Summary (Last 24 hours) at 12/01/13 0825 Last data filed at 12/01/13 0536  Gross per 24 hour  Intake    240 ml  Output    820 ml  Net   -580 ml    Lab Results: No results found for this basename: NA, K, CL, CO2, GLUCOSE, BUN, CREATININE, CALCIUM, MG, PHOS,  in the last 72 hours No results found for this basename: AST, ALT, ALKPHOS, BILITOT, PROT, ALBUMIN,  in the last 72 hours No results found for this basename: LIPASE, AMYLASE,  in the last 72 hours No results found for this basename: WBC, NEUTROABS, HGB, HCT, MCV, PLT,  in the last 72 hours No results found for this basename: CKTOTAL, CKMB, CKMBINDEX, TROPONINI,  in the last 72 hours BNP No results found for this basename: probnp   No results found for this basename: DDIMER,  in the last 72 hours No results found for this basename: HGBA1C,  in the last 72 hours No results found for this basename: CHOL, HDL, LDLCALC, TRIG, CHOLHDL, LDLDIRECT,  in the last 72 hours No results found for this basename: TSH, T4TOTAL, FREET3, T3FREE, THYROIDAB,  in the last 72 hours No results found for this basename: VITAMINB12, FOLATE, FERRITIN, TIBC, IRON, RETICCTPCT,   in the last 72 hours  Studies/Results: No results found. Medications: Medications administered in the last 24 hours reviewed.  Current Medication List reviewed.    LOS: 4 days   Tyrone Schimke Internal Medicine @ [facility] [phone]) 12/01/2013, 8:25 AM

## 2013-12-02 ENCOUNTER — Encounter: Payer: Self-pay | Admitting: *Deleted

## 2013-12-02 ENCOUNTER — Other Ambulatory Visit: Payer: Self-pay | Admitting: *Deleted

## 2013-12-02 LAB — GLUCOSE, CAPILLARY
GLUCOSE-CAPILLARY: 107 mg/dL — AB (ref 70–99)
Glucose-Capillary: 202 mg/dL — ABNORMAL HIGH (ref 70–99)

## 2013-12-02 MED ORDER — ALBUTEROL SULFATE (2.5 MG/3ML) 0.083% IN NEBU: 2.5000 mg | INHALATION_SOLUTION | RESPIRATORY_TRACT | Status: DC | PRN

## 2013-12-02 MED ORDER — LEVOFLOXACIN 500 MG PO TABS: 500.0000 mg | ORAL_TABLET | Freq: Every day | ORAL | Status: AC

## 2013-12-02 MED ORDER — HYDROCODONE-ACETAMINOPHEN 5-325 MG PO TABS
1.0000 | ORAL_TABLET | Freq: Four times a day (QID) | ORAL | Status: DC | PRN
Start: 2013-12-02 — End: 2015-07-24

## 2013-12-02 NOTE — Telephone Encounter (Signed)
[name] 

## 2013-12-02 NOTE — Progress Notes (Signed)
Clinical Social Work  CSW prepared DC with FL2 included. MD did not leave hard scripts but SNF agreeable to accept patient without them. [facility] completed paperwork and agreeable to accept patient. CSW informed patient and RN of plans. Patient reports he will contact family and CSW does not need to call. Patient prefers [facility] to transport him to SNF and aware of no guarantee of payment. CSW coordinated transportation via [place]. Request #: [id number].  CSW is signing off but available if needed.  [place], [place] [phone]

## 2013-12-02 NOTE — Progress Notes (Signed)
OT Cancellation Note  Patient Details Name: GABRIAL DOMINE MRN: [id number] DOB: 26-Feb-1922   Cancelled Treatment:    Reason Eval/Treat Not Completed: OT screened, no needs identified, will sign off, will Defer OT needs to SNF  Betsy Pries 12/02/2013, 11:34 AM

## 2013-12-02 NOTE — Progress Notes (Signed)
Physical Therapy Treatment Patient Details Name: Chad French MRN: [id number] DOB: 02-29-1922 Today's Date: 12/02/2013    History of Present Illness 80 yo male admitted 11/27/13 wiith weakness, fever, N/V. Flu vs CAP    PT Comments    Assisted pt OOB with increased time and amb in hallway.  Slow gait.  Max c/o weakness but stated "my legs feel good".  Positioned in recliner chair.    Follow Up Recommendations  SNF ([facility])     Equipment Recommendations       Recommendations for Other Services       Precautions / Restrictions Precautions Precautions: Fall Restrictions Weight Bearing Restrictions: No    Mobility  Bed Mobility Overal bed mobility: Needs Assistance Bed Mobility: Supine to Sit     Supine to sit: Min assist     General bed mobility comments: min assist for upper body and increased time   Transfers Overall transfer level: Needs assistance Equipment used: Rolling walker (2 wheeled) Transfers: Sit to/from Stand Sit to Stand: Min assist            Ambulation/Gait Ambulation/Gait assistance: Min assist Ambulation Distance (Feet): 65 Feet Assistive device: Rolling walker (2 wheeled) Gait Pattern/deviations: Step-to pattern;Trunk flexed Gait velocity: decreased   General Gait Details: increased, increased time with poor flex posture (trunk/hips/knees) Limited activity tolerance/fatigues quickly. Poor self corrective reaction indicating HIGH FALL RISK.   Stairs            Wheelchair Mobility    Modified Rankin (Stroke Patients Only)       Balance                                    Cognition                            Exercises      General Comments        Pertinent Vitals/Pain     Home Living                      Prior Function            PT Goals (current goals can now be found in the care plan section) Progress towards PT goals: Progressing toward goals     Frequency  Min 3X/week    PT Plan      Co-evaluation             End of Session Equipment Utilized During Treatment: Gait belt Activity Tolerance: Patient limited by fatigue Patient left: in chair;with call bell/phone within reach;with chair alarm set     Time: 6720-9470 PT Time Calculation (min): 24 min  Charges:  $Gait Training: 8-22 mins $Therapeutic Activity: 8-22 mins                    G Codes:      Rica Koyanagi  PTA [facility]  Acute  Rehab Pager      [phone]

## 2013-12-02 NOTE — Discharge Summary (Signed)
Physician Discharge Summary  Patient ID: Chad French MRN: [id number] DOB/AGE: [birth year] 80 y.o.  Admit date: 11/27/2013 Discharge date: 12/02/2013  Admission Diagnoses:weakness  Discharge Diagnoses:  Principal Problem:   CAP (community acquired pneumonia) Active Problems:   HTN (hypertension)   Degenerative disc disease   Diabetes mellitus   Febrile illness   Discharged Condition: stable  Hospital Course:  The patient presents to the hospital with the above chief complaint, he was febrile, had leukocytosis initial x-ray showed questionable infiltrate on the left. Patient was admitted to a medical floor bed start on IV antibiotics. Patient had followup x-ray. No definitive pneumonia seen on x-ray however patient had rhonchi on the left, leukocytosis and febrile on presentation therefore antibiotic course was continued based on  his clinical presentation. Swab for influenza was negative, no other source for infection was found. Patient was found to be deconditioning, he was seen by physical therapy and skilled nursing facility was recommended. There were no complications during this hospitalization.  Consults:    Significant Diagnostic Studies:Dg Chest 2 View  11/28/2013   CLINICAL DATA:  Pneumonia  EXAM: CHEST  2 VIEW  COMPARISON:  11/27/2013  FINDINGS: Cardiac enlargement without heart failure. Negative for pneumonia. Lungs remain clear. No effusion.  IMPRESSION: No active cardiopulmonary disease.   Electronically Signed   By: Franchot Gallo M.D.   On: 11/28/2013 09:54   Dg Chest 2 View  11/27/2013   CLINICAL DATA:  Fever and shortness of breath  EXAM: CHEST  2 VIEW  COMPARISON:  PA and lateral chest x-ray of December 01, 2010.  FINDINGS: The lungs are hypoinflated on the frontal film but better inflated on the lateral film. There is increased density on the lateral film projecting over the thoracic spine which may reflect atelectasis or pneumonia in the superior segment of the left  lower lobe. The cardiopericardial silhouette appears mildly enlarged. The central pulmonary vascularity is prominent. There is no evidence of a pleural effusion. The observed portions of the bony thorax appear normal.  IMPRESSION: The study is limited due to patient positioning. Increased density projecting over the mid thoracic spine on the lateral film likely corresponds to density in the retrocardiac region on the frontal film and may reflect pneumonia or atelectasis. An upright PA and lateral chest x-ray is recommended when the patient can tolerate the procedure.   Electronically Signed   By: David  Martinique   On: 11/27/2013 15:43      Discharge Exam: Blood pressure 132/74, pulse 81, temperature 98.4 F (36.9 C), temperature source Oral, resp. rate 16, height 5\' 7"  (1.702 m), weight 76.8 kg (169 lb 5 oz), SpO2 91.00%. General appearance: alert and cooperative Resp: clear to auscultation bilaterally Cardio: regular rate and rhythm, S1, S2 normal, no murmur, click, rub or gallop Extremities: extremities normal, atraumatic, no cyanosis or edema  Disposition: skilled nursing facility   Future Appointments Provider Department Dept Phone   04/11/2014 1:30 PM Candee Furbish, MD [facility] [phone]       Medication List         albuterol (2.5 MG/3ML) 0.083% nebulizer solution  Commonly known as:  PROVENTIL  Take 3 mLs (2.5 mg total) by nebulization every 4 (four) hours as needed for wheezing.     atorvastatin 20 MG tablet  Commonly known as:  LIPITOR  Take 20 mg by mouth daily.     brimonidine 0.2 % ophthalmic solution  Commonly known as:  ALPHAGAN  Place 1 drop into  the right eye 2 (two) times daily. Right eye     cholecalciferol 400 UNITS Tabs tablet  Commonly known as:  VITAMIN D  Take 400 Units by mouth daily.     ferrous fumarate 325 (106 FE) MG Tabs tablet  Commonly known as:  HEMOCYTE - 106 mg FE  Take 1 tablet by mouth daily.      HYDROcodone-acetaminophen 5-325 MG per tablet  Commonly known as:  NORCO/VICODIN  Take 1 tablet by mouth every 6 (six) hours as needed for moderate pain.     ketorolac 0.4 % Soln  Commonly known as:  ACULAR  1 drop daily. To left eye     levofloxacin 500 MG tablet  Commonly known as:  LEVAQUIN  Take 1 tablet (500 mg total) by mouth daily.     lidocaine 5 %  Commonly known as:  LIDODERM  Place 1 patch onto the skin daily. Remove & Discard patch within 12 hours or as directed by MD     lisinopril 10 MG tablet  Commonly known as:  PRINIVIL,ZESTRIL  Take 10 mg by mouth daily.     timolol 0.5 % ophthalmic solution  Commonly known as:  BETIMOL  Place 2 drops into both eyes 2 (two) times daily.           Follow-up Information   Follow up with Arian Murley D, MD In 2 weeks.   Specialty:  Internal Medicine   Contact information:   [address] [place] [address] [phone]       Signed: Kandice Hams 12/02/2013, 9:16 AM

## 2013-12-03 ENCOUNTER — Non-Acute Institutional Stay (SKILLED_NURSING_FACILITY): Payer: Medicare Other | Admitting: Adult Health

## 2013-12-03 DIAGNOSIS — R5381 Other malaise: Secondary | ICD-10-CM

## 2013-12-03 DIAGNOSIS — I10 Essential (primary) hypertension: Secondary | ICD-10-CM

## 2013-12-03 DIAGNOSIS — R531 Weakness: Secondary | ICD-10-CM

## 2013-12-03 DIAGNOSIS — D649 Anemia, unspecified: Secondary | ICD-10-CM

## 2013-12-03 DIAGNOSIS — B372 Candidiasis of skin and nail: Secondary | ICD-10-CM

## 2013-12-03 DIAGNOSIS — R5383 Other fatigue: Secondary | ICD-10-CM

## 2013-12-03 DIAGNOSIS — J189 Pneumonia, unspecified organism: Secondary | ICD-10-CM

## 2013-12-03 DIAGNOSIS — E785 Hyperlipidemia, unspecified: Secondary | ICD-10-CM

## 2013-12-03 DIAGNOSIS — E119 Type 2 diabetes mellitus without complications: Secondary | ICD-10-CM

## 2013-12-03 DIAGNOSIS — IMO0002 Reserved for concepts with insufficient information to code with codable children: Secondary | ICD-10-CM

## 2013-12-03 LAB — CULTURE, BLOOD (ROUTINE X 2)
CULTURE: NO GROWTH
Culture: NO GROWTH

## 2013-12-04 ENCOUNTER — Encounter: Payer: Self-pay | Admitting: Adult Health

## 2013-12-04 DIAGNOSIS — B372 Candidiasis of skin and nail: Secondary | ICD-10-CM | POA: Insufficient documentation

## 2013-12-04 DIAGNOSIS — R531 Weakness: Secondary | ICD-10-CM | POA: Insufficient documentation

## 2013-12-04 NOTE — Progress Notes (Signed)
Patient ID: Chad French, male   DOB: Jan 18, 1922, 80 y.o.   MRN: [id number]               PROGRESS NOTE  DATE: 12/03/2013  FACILITY: Nursing Home Location: [facility]  LEVEL OF CARE: SNF (31)  Acute Visit  CHIEF COMPLAINT:  Follow-up Hospitalization  HISTORY OF PRESENT ILLNESS: This is a 80 year old male who has been admitted to [facility] on 12/02/13 from [facility] with Community Acquired Pneumonia and Generalized weakness. He has been admitted for a short-term rehabilitation.  REASSESSMENT OF ONGOING PROBLEM(S):  HTN: Pt 's HTN remains stable.  Denies CP, sob, DOE, pedal edema, headaches, dizziness or visual disturbances.  No complications from the medications currently being used.  Last BP : 118/60  ANEMIA: The anemia has been stable. The patient denies fatigue, melena or hematochezia. No complications from the medications currently being used. 4/15 hgb 13.4  DM:pt's DM remains stable.  Pt denies polyuria, polydipsia, polyphagia, changes in vision or hypoglycemic episodes.  Diet-controlled   PAST MEDICAL HISTORY : Reviewed.  No changes.  CURRENT MEDICATIONS: Reviewed per [facility]  REVIEW OF SYSTEMS:  GENERAL: no change in appetite, no fatigue, no weight changes, no fever, chills or weakness RESPIRATORY: no cough, SOB, DOE, wheezing, hemoptysis CARDIAC: no chest pain, edema or palpitations GI: no abdominal pain, diarrhea, constipation, heart burn, nausea or vomiting  PHYSICAL EXAMINATION  GENERAL: no acute distress, normal body habitus SKIN: erythematous rashes on scrotum  EYES: conjunctivae normal, sclerae normal, normal eye lids NECK: supple, trachea midline, no neck masses, no thyroid tenderness, no thyromegaly LYMPHATICS: no LAN in the neck, no supraclavicular LAN RESPIRATORY: breathing is even & unlabored, BS CTAB CARDIAC: RRR, no murmur,no extra heart sounds, no edema GI: abdomen soft, normal BS, no masses, no tenderness, no  hepatomegaly, no splenomegaly EXTREMITIES: able to move all 4 extremities PSYCHIATRIC: the patient is alert & oriented to person, affect & behavior appropriate  LABS/RADIOLOGY: Labs reviewed: Basic Metabolic Panel:  Recent Labs  11/27/13 1420 11/28/13 0542  NA 140 141  K 4.6 4.2  CL 102 106  CO2 25 23  GLUCOSE 137* 129*  BUN 16 15  CREATININE 0.98 0.96  CALCIUM 9.8 8.9   Liver Function Tests:  Recent Labs  11/27/13 1420 11/28/13 0542  AST 26 48*  ALT 19 34  ALKPHOS 89 71  BILITOT 0.6 0.6  PROT 7.6 6.3  ALBUMIN 4.0 3.2*   CBC:  Recent Labs  11/27/13 1420 11/28/13 0542  WBC 11.5* 7.9  NEUTROABS 9.6*  --   HGB 15.4 13.4  HCT 45.6 39.9  MCV 95.8 96.6  PLT 171 123*   CBG:  Recent Labs  12/01/13 2154 12/02/13 0731 12/02/13 1214  GLUCAP 148* 107* 202*    ASSESSMENT/PLAN:  Generalized weakness - for rehabilitation Community acquired pneumonia - continue Levaquin Hypertension - well controlled; continue lisinopril Degenerative disc disease - continue lidocaine 5% patch Anemia - latest hemoglobin 13.4; discontinue ferrous fumarate; CBC in one week Candida skin - Lye nystatin 100,000 units/gram cream to scrotum rashes the right ear x2 weeks Hyperlipidemia - patient refuses to take Lipitor due to side effects (body aches ); discontinue Lipitor Diabetes mellitus, type II - diet controlled; CBG daily x1 week  CPT CODE: 40814  Monina Vargas - NP [facility] [phone]

## 2013-12-05 ENCOUNTER — Non-Acute Institutional Stay (SKILLED_NURSING_FACILITY): Payer: Medicare Other | Admitting: Internal Medicine

## 2013-12-05 DIAGNOSIS — R531 Weakness: Secondary | ICD-10-CM

## 2013-12-05 DIAGNOSIS — I10 Essential (primary) hypertension: Secondary | ICD-10-CM

## 2013-12-05 DIAGNOSIS — E119 Type 2 diabetes mellitus without complications: Secondary | ICD-10-CM

## 2013-12-05 DIAGNOSIS — R5383 Other fatigue: Secondary | ICD-10-CM

## 2013-12-05 DIAGNOSIS — J189 Pneumonia, unspecified organism: Secondary | ICD-10-CM

## 2013-12-05 DIAGNOSIS — R5381 Other malaise: Secondary | ICD-10-CM

## 2013-12-05 DIAGNOSIS — E785 Hyperlipidemia, unspecified: Secondary | ICD-10-CM

## 2013-12-06 ENCOUNTER — Non-Acute Institutional Stay (SKILLED_NURSING_FACILITY): Payer: Medicare Other | Admitting: Adult Health

## 2013-12-06 DIAGNOSIS — R5383 Other fatigue: Secondary | ICD-10-CM

## 2013-12-06 DIAGNOSIS — E119 Type 2 diabetes mellitus without complications: Secondary | ICD-10-CM

## 2013-12-06 DIAGNOSIS — R531 Weakness: Secondary | ICD-10-CM

## 2013-12-06 DIAGNOSIS — D649 Anemia, unspecified: Secondary | ICD-10-CM

## 2013-12-06 DIAGNOSIS — E785 Hyperlipidemia, unspecified: Secondary | ICD-10-CM

## 2013-12-06 DIAGNOSIS — J189 Pneumonia, unspecified organism: Secondary | ICD-10-CM

## 2013-12-06 DIAGNOSIS — I10 Essential (primary) hypertension: Secondary | ICD-10-CM

## 2013-12-06 DIAGNOSIS — B372 Candidiasis of skin and nail: Secondary | ICD-10-CM

## 2013-12-06 DIAGNOSIS — IMO0002 Reserved for concepts with insufficient information to code with codable children: Secondary | ICD-10-CM

## 2013-12-06 DIAGNOSIS — R5381 Other malaise: Secondary | ICD-10-CM

## 2013-12-11 ENCOUNTER — Encounter: Payer: Self-pay | Admitting: Adult Health

## 2013-12-11 NOTE — Progress Notes (Signed)
Patient ID: Chad French, male   DOB: 11-21-1921, 80 y.o.   MRN: [id number]                 PROGRESS NOTE  DATE: 12/06/13  FACILITY: Nursing Home Location: [facility]  LEVEL OF CARE: SNF (31)  Acute Visit  CHIEF COMPLAINT:  Discharge Notes  HISTORY OF PRESENT ILLNESS: This is a 80 year old male who is for discharge home with Home health PT, OT, Nursing and CNA. DME: Rolling walker. He has been admitted to [facility] on 12/02/13 from [facility] with Community Acquired Pneumonia and Generalized weakness.  Patient was admitted to this facility for short-term rehabilitation after the patient's recent hospitalization.  Patient has completed SNF rehabilitation and therapy has cleared the patient for discharge.  REASSESSMENT OF ONGOING PROBLEM(S):  HTN: Pt 's HTN remains stable.  Denies CP, sob, DOE, pedal edema, headaches, dizziness or visual disturbances.  No complications from the medications currently being used.  Last BP : 130/79  PNEUMONIA: The pneumonia remains stable.  The patient denies ongoing chest pain, cough, shortness of breath, fever, chills or night sweats. No complications reported from the current antibiotic being used.  HYPERLIPIDEMIA: No complications from the medications presently being used.   PAST MEDICAL HISTORY : Reviewed.  No changes.  CURRENT MEDICATIONS: Reviewed per [facility]  REVIEW OF SYSTEMS:  GENERAL: no change in appetite, no fatigue, no weight changes, no fever, chills or weakness RESPIRATORY: no cough, SOB, DOE, wheezing, hemoptysis CARDIAC: no chest pain, edema or palpitations GI: no abdominal pain, diarrhea, constipation, heart burn, nausea or vomiting  PHYSICAL EXAMINATION  GENERAL: no acute distress, normal body habitus SKIN: erythematous rashes on scrotum  NECK: supple, trachea midline, no neck masses, no thyroid tenderness, no thyromegaly LYMPHATICS: no LAN in the neck, no supraclavicular LAN RESPIRATORY:  breathing is even & unlabored, BS CTAB CARDIAC: RRR, no murmur,no extra heart sounds, no edema GI: abdomen soft, normal BS, no masses, no tenderness, no hepatomegaly, no splenomegaly EXTREMITIES: able to move all 4 extremities PSYCHIATRIC: the patient is alert & oriented to person, affect & behavior appropriate  LABS/RADIOLOGY: Labs reviewed: Basic Metabolic Panel:  Recent Labs  11/27/13 1420 11/28/13 0542  NA 140 141  K 4.6 4.2  CL 102 106  CO2 25 23  GLUCOSE 137* 129*  BUN 16 15  CREATININE 0.98 0.96  CALCIUM 9.8 8.9   Liver Function Tests:  Recent Labs  11/27/13 1420 11/28/13 0542  AST 26 48*  ALT 19 34  ALKPHOS 89 71  BILITOT 0.6 0.6  PROT 7.6 6.3  ALBUMIN 4.0 3.2*   CBC:  Recent Labs  11/27/13 1420 11/28/13 0542  WBC 11.5* 7.9  NEUTROABS 9.6*  --   HGB 15.4 13.4  HCT 45.6 39.9  MCV 95.8 96.6  PLT 171 123*   CBG:  Recent Labs  12/01/13 2154 12/02/13 0731 12/02/13 1214  GLUCAP 148* 107* 202*    ASSESSMENT/PLAN:  Generalized weakness - for Home health PT, OT, Nursing and CNA  Community acquired pneumonia - continue Levaquin (last dose is 12/12/13) Hypertension - well controlled; continue lisinopril Degenerative disc disease - continue lidocaine 5% patch PRN Anemia - for CBC in one week Candida skin - continue Nystatin cream  Hyperlipidemia - recently discontinued Lipitor due to refusal (causing him to have muscle pains) Diabetes mellitus, type II - diet controlled   I have filled out patient's discharge paperwork and written prescriptions.  Patient will receive home health  PT, OT, Nursing and CNA.  DME provided: Rolling walker  Total discharge time: Greater than 30 minutes Discharge time involved coordination of the discharge process with Education officer, museum, nursing staff and therapy department. Medical justification for home health services/DME verified.   CPT CODE: 25366 Seth Bake - NP [facility] [phone]

## 2013-12-14 ENCOUNTER — Encounter: Payer: Self-pay | Admitting: Internal Medicine

## 2013-12-14 NOTE — Progress Notes (Signed)
MRN: [id number] Name: Chad French  Sex: male Age: 80 y.o. DOB: Dec 23, 1921  [place] #: Chad French Facility/Room: 950 Level Of Care: SNF Provider: Hennie Duos Emergency Contacts: Extended Emergency Contact Information Primary Emergency Contact: Adams,Susan Address: [address]          [place], [place] [address] Chad French of [place] Phone: [phone] Relation: Daughter Secondary Emergency Contact: Chad French Address: [address]            [place], [place] [address] Chad French of [place] Phone: [phone] Relation: Spouse  Code Status: FULL  Allergies: Review of patient's allergies indicates no known allergies.  Chief Complaint  Patient presents with  . nursing home admission    HPI: Patient is 80 y.o. male who was hosp for PNA and is admitted to SNF for OT/PT for generalized weakness.  Past Medical History  Diagnosis Date  . Diverticulitis     with colonoscopy in 11/2010-Diverticula and polyp whioch was a non-malignant tubuular adenoma  . HTN (hypertension)   . HLD (hyperlipidemia)   . Degenerative disc disease   . Diabetes mellitus   . Glaucoma     Past Surgical History  Procedure Laterality Date  . Colonoscopy  4/12      Medication List       This list is accurate as of: 12/05/13 11:59 PM.  Always use your most recent med list.               albuterol (2.5 MG/3ML) 0.083% nebulizer solution  Commonly known as:  PROVENTIL  Take 3 mLs (2.5 mg total) by nebulization every 4 (four) hours as needed for wheezing.     brimonidine 0.2 % ophthalmic solution  Commonly known as:  ALPHAGAN  Place 1 drop into the right eye 2 (two) times daily. Right eye     cholecalciferol 400 UNITS Tabs tablet  Commonly known as:  VITAMIN D  Take 400 Units by mouth daily.     HYDROcodone-acetaminophen 5-325 MG per tablet  Commonly known as:  NORCO/VICODIN  Take 1 tablet by mouth every 6 (six) hours as needed for moderate pain.     ketorolac 0.4 % Soln  Commonly known as:  ACULAR  1 drop daily. To left eye     lidocaine 5 %  Commonly known as:  LIDODERM  Place 1 patch onto the skin daily. Remove & Discard patch within 12 hours or as directed by MD     lisinopril 10 MG tablet  Commonly known as:  PRINIVIL,ZESTRIL  Take 10 mg by mouth daily.     timolol 0.5 % ophthalmic solution  Commonly known as:  BETIMOL  Place 2 drops into both eyes 2 (two) times daily.        No orders of the defined types were placed in this encounter.     There is no immunization history on file for this patient.  History  Substance Use Topics  . Smoking status: Former Smoker -- 1.00 packs/day for 5 years    Quit date: 08/23/1971  . Smokeless tobacco: Not on file  . Alcohol Use: No    Family history is noncontributory    Review of Systems  DATA OBTAINED: from patient GENERAL: Feels well no fevers, fatigue, appetite changes SKIN: No itching, rash or wounds EYES: No eye pain, redness, discharge EARS: No earache, tinnitus, change in hearing NOSE: No congestion, drainage or bleeding  MOUTH/THROAT: No mouth or tooth pain,  RESPIRATORY: No cough, wheezing, SOB  CARDIAC: No chest pain, palpitations, lower extremity edema  GI: No abdominal pain, No N/V/D or constipation, No heartburn or reflux  GU: No dysuria, frequency or urgency, or incontinence  MUSCULOSKELETAL: No unrelieved bone/joint pain NEUROLOGIC: No headache, dizziness or focal weakness PSYCHIATRIC: No overt anxiety or sadness. Sleeps well. No behavior issue.   Filed Vitals:   12/14/13 2115  BP: 117/61  Pulse: 78  Temp: 97.1 F (36.2 C)  Resp: 20    Physical Exam  GENERAL APPEARANCE: Alert, conversant. Appropriately groomed. No acute distress.  SKIN: No diaphoresis rash HEAD: Normocephalic, atraumatic  EYES: Conjunctiva/lids clear. Pupils round, reactive. EOMs intact.  EARS: External exam WNL, canals clear. Hearing grossly normal.  NOSE: No deformity or  discharge.  MOUTH/THROAT: Lips w/o lesions.  RESPIRATORY: Breathing is even, unlabored. Lung sounds are clear   CARDIOVASCULAR: Heart RRR no murmurs, rubs or gallops. No peripheral edema.  GENITOURINARY: Bladder non tender, not distended  MUSCULOSKELETAL: No abnormal joints or musculature GASTROINTESTINAL: Abdomen is soft, non-tender, not distended w/ normal bowel sounds NEUROLOGIC: Oriented X3. Cranial nerves 2-12 grossly intact. Moves all extremities no tremor. PSYCHIATRIC: Mood and affect appropriate to situation, no behavioral issues  Patient Active Problem List   Diagnosis Date Noted  . Candidal skin infection 12/04/2013  . Generalized weakness 12/04/2013  . CAP (community acquired pneumonia) 11/27/2013  . Febrile illness 11/27/2013  . GI bleed 01/06/2012  . Anemia 01/06/2012  . Diverticulitis   . HTN (hypertension)   . HLD (hyperlipidemia)   . Degenerative disc disease   . Diabetes mellitus   . Obesity, Class III, BMI 40-49.9 (morbid obesity)   . Glaucoma     CBC    Component Value Date/Time   WBC 7.9 11/28/2013 0542   RBC 4.13* 11/28/2013 0542   HGB 13.4 11/28/2013 0542   HCT 39.9 11/28/2013 0542   PLT 123* 11/28/2013 0542   MCV 96.6 11/28/2013 0542   LYMPHSABS 0.8 11/27/2013 1420   MONOABS 1.1* 11/27/2013 1420   EOSABS 0.0 11/27/2013 1420   BASOSABS 0.0 11/27/2013 1420    CMP     Component Value Date/Time   NA 141 11/28/2013 0542   K 4.2 11/28/2013 0542   CL 106 11/28/2013 0542   CO2 23 11/28/2013 0542   GLUCOSE 129* 11/28/2013 0542   BUN 15 11/28/2013 0542   CREATININE 0.96 11/28/2013 0542   CALCIUM 8.9 11/28/2013 0542   PROT 6.3 11/28/2013 0542   ALBUMIN 3.2* 11/28/2013 0542   AST 48* 11/28/2013 0542   ALT 34 11/28/2013 0542   ALKPHOS 71 11/28/2013 0542   BILITOT 0.6 11/28/2013 0542   GFRNONAA 71* 11/28/2013 0542   GFRAA 82* 11/28/2013 0542    Assessment and Plan  CAP (community acquired pneumonia) Clinical and exam presentation for PNA though CXR was questionable. Pt improved with IV abx  then levaquin  Generalized weakness Admitted to SNF for deconditioning and overall weakness  HTN (hypertension) Continue Lisinopril 10 mg  Diabetes mellitus Diet, ACE  HLD (hyperlipidemia) Continue lipitor    Hennie Duos, MD

## 2013-12-14 NOTE — Assessment & Plan Note (Signed)
Continue lipitor  ?

## 2013-12-14 NOTE — Assessment & Plan Note (Signed)
Clinical and exam presentation for PNA though CXR was questionable. Pt improved with IV abx then levaquin

## 2013-12-14 NOTE — Assessment & Plan Note (Signed)
Diet, ACE

## 2013-12-14 NOTE — Assessment & Plan Note (Signed)
Continue Lisinopril 10 mg

## 2013-12-14 NOTE — Assessment & Plan Note (Signed)
Admitted to SNF for deconditioning and overall weakness

## 2014-03-18 ENCOUNTER — Encounter: Payer: Self-pay | Admitting: Cardiology

## 2014-04-11 ENCOUNTER — Ambulatory Visit: Payer: Medicare Other | Admitting: Cardiology

## 2014-05-16 ENCOUNTER — Ambulatory Visit: Payer: Medicare Other | Admitting: Cardiology

## 2014-06-12 ENCOUNTER — Encounter: Payer: Self-pay | Admitting: Cardiology

## 2014-06-12 ENCOUNTER — Ambulatory Visit (INDEPENDENT_AMBULATORY_CARE_PROVIDER_SITE_OTHER): Payer: Medicare Other | Admitting: Cardiology

## 2014-06-12 VITALS — BP 130/80 | HR 84 | Ht 67.0 in | Wt 163.0 lb

## 2014-06-12 DIAGNOSIS — I10 Essential (primary) hypertension: Secondary | ICD-10-CM

## 2014-06-12 DIAGNOSIS — I255 Ischemic cardiomyopathy: Secondary | ICD-10-CM

## 2014-06-12 DIAGNOSIS — I252 Old myocardial infarction: Secondary | ICD-10-CM

## 2014-06-12 MED ORDER — LISINOPRIL 10 MG PO TABS: 10.0000 mg | ORAL_TABLET | Freq: Every day | ORAL | Status: DC

## 2014-06-12 NOTE — Patient Instructions (Signed)
The current medical regimen is effective;  continue present plan and medications.  Follow up in 1 year with Dr Skains.  You will receive a letter in the mail 2 months before you are due.  Please call us when you receive this letter to schedule your follow up appointment.  

## 2014-06-12 NOTE — Progress Notes (Signed)
[place]. [address], [address] [place], [place]  [address] Phone: [phone] Fax:  [phone]  Date:  06/12/2014   ID:  Chad French, DOB 06-May-1922, MRN [id number]  PCP:  Kandice Hams, MD   History of Present Illness: Chad French is a 80 y.o. male with prior silent MI, aneurysmal segment of left ventricle with EF of approximately 45-50% here for evaluation of recent falls. It is been greater than 3 years since our last visit. He recently had 4 falls, body went limp, falls on back. One time fell outside and needed a neighbor to help. No vision changes. Stays lucid. Legs turn to puddy and he hits the floor. He has not been experiencing any anginal-like symptoms or shortness of breath or significant palpitations. Apical infarct. In regards to lipids, his triglycerides have been in the 400 range. He continues with atorvastatin. He is also on ACE inhibitor given his prior silent myocardial infarction. He had been on high dose gabapentin in the past which has been discontinued. He also had CPK which was normal. They were wondering if the falls had anything to do with statin.  04/12/13-at last visit I decreased his lisinopril down to 10 mg. He is feeling much better. Echocardiogram showed a reassuring 45% EF. Improved. Monitor showed no evidence of adverse arrhythmias, pauses. Reassuring. We will continue to monitor. If necessary, we can decrease lisinopril more.  06/12/14-doing very well, no syncope, no chest pain, no shortness of breath. Chad French, he says he is doing disgustingly well. Unfortunately, no longer able to play golf, no longer able to fly.    Wt Readings from Last 3 Encounters:  06/12/14 163 lb (73.936 kg)  12/06/13 168 lb 3.2 oz (76.295 kg)  12/03/13 169 lb 6.4 oz (76.839 kg)     Past Medical History  Diagnosis Date  . Diverticulitis     with colonoscopy in 11/2010-Diverticula and polyp whioch was a non-malignant tubuular adenoma  . HTN (hypertension)    . HLD (hyperlipidemia)   . Degenerative disc disease   . Diabetes mellitus   . Glaucoma     Past Surgical History  Procedure Laterality Date  . Colonoscopy  4/12    Current Outpatient Prescriptions  Medication Sig Dispense Refill  . brimonidine (ALPHAGAN) 0.2 % ophthalmic solution Place 1 drop into the right eye 2 (two) times daily. Right eye      . cholecalciferol (VITAMIN D) 400 UNITS TABS tablet Take 400 Units by mouth daily.      Marland Kitchen HYDROcodone-acetaminophen (NORCO/VICODIN) 5-325 MG per tablet Take 1 tablet by mouth every 6 (six) hours as needed for moderate pain.  120 tablet  0  . ketorolac (ACULAR) 0.4 % SOLN 1 drop daily. To left eye      . lidocaine (LIDODERM) 5 % Place 2 patches onto the skin daily. Remove & Discard patch within 12 hours or as directed by MD      . lisinopril (PRINIVIL,ZESTRIL) 10 MG tablet Take 10 mg by mouth daily.      . timolol (BETIMOL) 0.5 % ophthalmic solution Place 2 drops into both eyes 2 (two) times daily.       No current facility-administered medications for this visit.    Allergies:   No Known Allergies  Social History:  The patient  reports that he quit smoking about 42 years ago. He does not have any smokeless tobacco history on file. He reports that he does not drink alcohol or use  illicit drugs.   Family History  Problem Relation Age of Onset  . Diabetes Mother   . Diabetes Maternal Grandmother   . Kidney disease Maternal Grandfather     ROS:  Please see the history of present illness.   Denies any bleeding, syncope, orthopnea, PND   All other systems reviewed and negative.   PHYSICAL EXAM: VS:  BP 130/80  Pulse 84  Ht 5\' 7"  (1.702 m)  Wt 163 lb (73.936 kg)  BMI 25.52 kg/m2 Elderly, cane, in no acute distress HEENT: normal, [place]/AT, EOMI Neck: no JVD, normal carotid upstroke, no bruit Cardiac:  normal S1, S2; RRR; soft systolic murmur Lungs:  clear to auscultation bilaterally, no wheezing, rhonchi or rales Abd: soft,  nontender, no hepatomegaly, no bruits Ext: no edema, 2+ distal pulses Skin: warm and dry GU: deferred Neuro: no focal abnormalities noted, AAO x 3  EKG:  None today     ASSESSMENT AND PLAN:  1. Old MI - doing well. No anginal symptoms. No change in medications. Aneurysmal segment of left ventricle. 2. Ischemic cardiomyopathy-mildly reduced ejection fraction 45%. Continue with low-dose lisinopril. Previously no beta blocker because of recent falls. 3. Hypertension-essential-doing well. We will give him prescription for lisinopril 10. 4. We will see him back in one year.  Signed, Candee Furbish, MD [facility]  06/12/2014 1:58 PM

## 2014-10-27 ENCOUNTER — Other Ambulatory Visit (HOSPITAL_COMMUNITY): Payer: Self-pay | Admitting: Internal Medicine

## 2014-10-27 DIAGNOSIS — I779 Disorder of arteries and arterioles, unspecified: Secondary | ICD-10-CM

## 2014-10-28 ENCOUNTER — Ambulatory Visit (HOSPITAL_COMMUNITY)
Admission: RE | Admit: 2014-10-28 | Discharge: 2014-10-28 | Disposition: A | Discharge status: Home / Self Care | Payer: Medicare Other | Source: Ambulatory Visit | Attending: Internal Medicine | Admitting: Internal Medicine

## 2014-10-28 DIAGNOSIS — I779 Disorder of arteries and arterioles, unspecified: Secondary | ICD-10-CM

## 2014-10-28 DIAGNOSIS — I6523 Occlusion and stenosis of bilateral carotid arteries: Secondary | ICD-10-CM | POA: Diagnosis not present

## 2014-10-28 NOTE — Progress Notes (Signed)
*  PRELIMINARY RESULTS* Vascular Ultrasound Carotid Duplex (Doppler) has been completed.  Findings suggest 1-39% internal carotid artery stenosis bilaterally. Vertebral arteries are patent with antegrade flow.  10/28/2014 10:26 AM Maudry Mayhew, RVT, RDCS, RDMS

## 2015-06-15 ENCOUNTER — Ambulatory Visit: Payer: Medicare Other | Admitting: Cardiology

## 2015-07-24 ENCOUNTER — Encounter: Payer: Self-pay | Admitting: Cardiology

## 2015-07-24 ENCOUNTER — Ambulatory Visit (INDEPENDENT_AMBULATORY_CARE_PROVIDER_SITE_OTHER): Payer: Medicare Other | Admitting: Cardiology

## 2015-07-24 VITALS — BP 120/60 | HR 77 | Ht 67.0 in | Wt 163.0 lb

## 2015-07-24 DIAGNOSIS — I252 Old myocardial infarction: Secondary | ICD-10-CM | POA: Diagnosis not present

## 2015-07-24 DIAGNOSIS — I255 Ischemic cardiomyopathy: Secondary | ICD-10-CM

## 2015-07-24 DIAGNOSIS — R42 Dizziness and giddiness: Secondary | ICD-10-CM | POA: Diagnosis not present

## 2015-07-24 DIAGNOSIS — H4020X4 Unspecified primary angle-closure glaucoma, indeterminate stage: Secondary | ICD-10-CM

## 2015-07-24 DIAGNOSIS — H402214 Chronic angle-closure glaucoma, right eye, indeterminate stage: Secondary | ICD-10-CM

## 2015-07-24 DIAGNOSIS — I10 Essential (primary) hypertension: Secondary | ICD-10-CM

## 2015-07-24 NOTE — Progress Notes (Signed)
[place]. [address], [address] [place], [place]  [address] Phone: [phone] Fax:  [phone]  Date:  07/24/2015   ID:  Chad French, DOB Jun 12, 1922, MRN [id number]  PCP:  Kandice Hams, MD   History of Present Illness: Chad French is a 81 y.o. male with prior silent MI, aneurysmal segment of left ventricle with EF of approximately 45-50% here for evaluation of recent falls. It is been greater than 3 years since our last visit. He recently had 4 falls, body went limp, falls on back. One time fell outside and needed a neighbor to help. No vision changes. Stays lucid. Legs turn to puddy and he hits the floor. He has not been experiencing any anginal-like symptoms or shortness of breath or significant palpitations. Apical infarct. In regards to lipids, his triglycerides have been in the 400 range. He continues with atorvastatin. He is also on ACE inhibitor given his prior silent myocardial infarction. He had been on high dose gabapentin in the past which has been discontinued. He also had CPK which was normal. They were wondering if the falls had anything to do with statin.  04/12/13-at last visit I decreased his lisinopril down to 10 mg. He is feeling much better. Echocardiogram showed a reassuring 45% EF. Improved. Monitor showed no evidence of adverse arrhythmias, pauses. Reassuring. We will continue to monitor. If necessary, we can decrease lisinopril more.  06/12/14-doing very well, no syncope, no chest pain, no shortness of breath. [name], he says he is doing disgustingly well. Unfortunately, no longer able to play golf, no longer able to fly.  07/24/15-overall doing well. Has had a challenging postoperative course for his right eye glaucoma. No chest pain, no shortness of breath, no syncope. He is also doing physical therapy for disequilibrium.   Wt Readings from Last 3 Encounters:  07/24/15 163 lb (73.936 kg)  06/12/14 163 lb (73.936 kg)  12/06/13 168 lb 3.2 oz  (76.295 kg)     Past Medical History  Diagnosis Date  . Diverticulitis     with colonoscopy in 11/2010-Diverticula and polyp whioch was a non-malignant tubuular adenoma  . HTN (hypertension)   . HLD (hyperlipidemia)   . Degenerative disc disease   . Diabetes mellitus ([place])   . Glaucoma     Past Surgical History  Procedure Laterality Date  . Colonoscopy  4/12    Current Outpatient Prescriptions  Medication Sig Dispense Refill  . cholecalciferol (VITAMIN D) 400 UNITS TABS tablet Take 400 Units by mouth daily.    Marland Kitchen lidocaine (LIDODERM) 5 % Place 2 patches onto the skin daily. Remove & Discard patch within 12 hours or as directed by MD    . lisinopril (PRINIVIL,ZESTRIL) 10 MG tablet Take 1 tablet (10 mg total) by mouth daily. 90 tablet 3   No current facility-administered medications for this visit.    Allergies:   No Known Allergies  Social History:  The patient  reports that he quit smoking about 43 years ago. He does not have any smokeless tobacco history on file. He reports that he does not drink alcohol or use illicit drugs.   Family History  Problem Relation Age of Onset  . Diabetes Mother   . Diabetes Maternal Grandmother   . Kidney disease Maternal Grandfather     ROS:  Please see the history of present illness.   Denies any bleeding, syncope, orthopnea, PND   All other systems reviewed and negative.   PHYSICAL EXAM: VS:  BP 120/60 mmHg  Pulse 77  Ht 5\' 7"  (1.702 m)  Wt 163 lb (73.936 kg)  BMI 25.52 kg/m2 Elderly, cane, in no acute distress HEENT: normal, [place]/AT, EOMI Neck: no JVD, normal carotid upstroke, no bruit Cardiac:  normal S1, S2; RRR; soft systolic murmur Lungs:  clear to auscultation bilaterally, no wheezing, rhonchi or rales Abd: soft, nontender, no hepatomegaly, no bruits Ext: no edema, 2+ distal pulses Skin: warm and dry GU: deferred Neuro: no focal abnormalities noted, AAO x 3  EKG:  Today 07/24/15-sinus rhythm, 77, old septal infarct, T-wave  inversion in aVL personally viewed.     ASSESSMENT AND PLAN:  1. Old MI - doing well. No anginal symptoms. No change in medications. Aneurysmal segment of left ventricle. 2. Ischemic cardiomyopathy-mildly reduced ejection fraction 45%. Continue with low-dose lisinopril. Previously no beta blocker because of recent falls. 3. Hypertension-essential-doing well.  lisinopril 10. [facility]. If his blood pressure begins to reduce too much, we will discontinue. 4. Disequilibrium-physical therapy. 5. Right eye glaucoma-currently healing. 6. We will see him back in one year.  Signed, Candee Furbish, MD [facility]  07/24/2015 11:23 AM

## 2015-07-24 NOTE — Patient Instructions (Signed)

## 2015-07-28 NOTE — Addendum Note (Signed)
Addended by: Domenica Reamer R on: 07/28/2015 11:21 AM   Modules accepted: Orders

## 2015-10-03 ENCOUNTER — Emergency Department (HOSPITAL_COMMUNITY): Payer: Medicare Other

## 2015-10-03 ENCOUNTER — Encounter (HOSPITAL_COMMUNITY): Payer: Self-pay | Admitting: Emergency Medicine

## 2015-10-03 ENCOUNTER — Inpatient Hospital Stay (HOSPITAL_COMMUNITY)
Admission: EM | Admit: 2015-10-03 | Discharge: 2015-10-20 | DRG: 270 | Disposition: A | Discharge status: Skilled Nursing Facility | Payer: Medicare Other | Attending: Cardiology | Admitting: Cardiology

## 2015-10-03 ENCOUNTER — Encounter (HOSPITAL_COMMUNITY)
Admission: EM | Disposition: A | Discharge status: Skilled Nursing Facility | Payer: Self-pay | Source: Home / Self Care | Attending: Cardiology

## 2015-10-03 DIAGNOSIS — N183 Chronic kidney disease, stage 3 (moderate): Secondary | ICD-10-CM | POA: Diagnosis present

## 2015-10-03 DIAGNOSIS — Z833 Family history of diabetes mellitus: Secondary | ICD-10-CM | POA: Diagnosis not present

## 2015-10-03 DIAGNOSIS — T148 Other injury of unspecified body region: Secondary | ICD-10-CM | POA: Diagnosis not present

## 2015-10-03 DIAGNOSIS — E8770 Fluid overload, unspecified: Secondary | ICD-10-CM | POA: Diagnosis not present

## 2015-10-03 DIAGNOSIS — I5023 Acute on chronic systolic (congestive) heart failure: Secondary | ICD-10-CM | POA: Diagnosis not present

## 2015-10-03 DIAGNOSIS — D72829 Elevated white blood cell count, unspecified: Secondary | ICD-10-CM | POA: Diagnosis present

## 2015-10-03 DIAGNOSIS — Z515 Encounter for palliative care: Secondary | ICD-10-CM | POA: Insufficient documentation

## 2015-10-03 DIAGNOSIS — Z66 Do not resuscitate: Secondary | ICD-10-CM | POA: Diagnosis present

## 2015-10-03 DIAGNOSIS — E1122 Type 2 diabetes mellitus with diabetic chronic kidney disease: Secondary | ICD-10-CM | POA: Diagnosis present

## 2015-10-03 DIAGNOSIS — I2129 ST elevation (STEMI) myocardial infarction involving other sites: Secondary | ICD-10-CM | POA: Diagnosis present

## 2015-10-03 DIAGNOSIS — E785 Hyperlipidemia, unspecified: Secondary | ICD-10-CM | POA: Diagnosis present

## 2015-10-03 DIAGNOSIS — I441 Atrioventricular block, second degree: Secondary | ICD-10-CM | POA: Diagnosis not present

## 2015-10-03 DIAGNOSIS — R0682 Tachypnea, not elsewhere classified: Secondary | ICD-10-CM | POA: Diagnosis present

## 2015-10-03 DIAGNOSIS — L02214 Cutaneous abscess of groin: Secondary | ICD-10-CM

## 2015-10-03 DIAGNOSIS — E872 Acidosis: Secondary | ICD-10-CM | POA: Diagnosis present

## 2015-10-03 DIAGNOSIS — N390 Urinary tract infection, site not specified: Secondary | ICD-10-CM | POA: Diagnosis not present

## 2015-10-03 DIAGNOSIS — R0902 Hypoxemia: Secondary | ICD-10-CM | POA: Diagnosis not present

## 2015-10-03 DIAGNOSIS — R0602 Shortness of breath: Secondary | ICD-10-CM

## 2015-10-03 DIAGNOSIS — I213 ST elevation (STEMI) myocardial infarction of unspecified site: Secondary | ICD-10-CM | POA: Diagnosis present

## 2015-10-03 DIAGNOSIS — R410 Disorientation, unspecified: Secondary | ICD-10-CM

## 2015-10-03 DIAGNOSIS — G4733 Obstructive sleep apnea (adult) (pediatric): Secondary | ICD-10-CM | POA: Diagnosis present

## 2015-10-03 DIAGNOSIS — I2510 Atherosclerotic heart disease of native coronary artery without angina pectoris: Secondary | ICD-10-CM | POA: Diagnosis present

## 2015-10-03 DIAGNOSIS — I5021 Acute systolic (congestive) heart failure: Secondary | ICD-10-CM | POA: Insufficient documentation

## 2015-10-03 DIAGNOSIS — Z7189 Other specified counseling: Secondary | ICD-10-CM | POA: Insufficient documentation

## 2015-10-03 DIAGNOSIS — I252 Old myocardial infarction: Secondary | ICD-10-CM | POA: Diagnosis not present

## 2015-10-03 DIAGNOSIS — Z9181 History of falling: Secondary | ICD-10-CM

## 2015-10-03 DIAGNOSIS — I509 Heart failure, unspecified: Secondary | ICD-10-CM

## 2015-10-03 DIAGNOSIS — E875 Hyperkalemia: Secondary | ICD-10-CM | POA: Diagnosis not present

## 2015-10-03 DIAGNOSIS — R5381 Other malaise: Secondary | ICD-10-CM

## 2015-10-03 DIAGNOSIS — Y92019 Unspecified place in single-family (private) house as the place of occurrence of the external cause: Secondary | ICD-10-CM | POA: Diagnosis not present

## 2015-10-03 DIAGNOSIS — I130 Hypertensive heart and chronic kidney disease with heart failure and stage 1 through stage 4 chronic kidney disease, or unspecified chronic kidney disease: Secondary | ICD-10-CM | POA: Diagnosis present

## 2015-10-03 DIAGNOSIS — I480 Paroxysmal atrial fibrillation: Secondary | ICD-10-CM | POA: Diagnosis not present

## 2015-10-03 DIAGNOSIS — Z87891 Personal history of nicotine dependence: Secondary | ICD-10-CM | POA: Diagnosis not present

## 2015-10-03 DIAGNOSIS — A419 Sepsis, unspecified organism: Secondary | ICD-10-CM | POA: Diagnosis not present

## 2015-10-03 DIAGNOSIS — I2121 ST elevation (STEMI) myocardial infarction involving left circumflex coronary artery: Secondary | ICD-10-CM | POA: Diagnosis not present

## 2015-10-03 DIAGNOSIS — W1839XA Other fall on same level, initial encounter: Secondary | ICD-10-CM | POA: Diagnosis present

## 2015-10-03 DIAGNOSIS — I442 Atrioventricular block, complete: Secondary | ICD-10-CM | POA: Diagnosis not present

## 2015-10-03 DIAGNOSIS — R0600 Dyspnea, unspecified: Secondary | ICD-10-CM

## 2015-10-03 DIAGNOSIS — R296 Repeated falls: Secondary | ICD-10-CM | POA: Diagnosis present

## 2015-10-03 DIAGNOSIS — R570 Cardiogenic shock: Secondary | ICD-10-CM | POA: Insufficient documentation

## 2015-10-03 DIAGNOSIS — J811 Chronic pulmonary edema: Secondary | ICD-10-CM | POA: Diagnosis present

## 2015-10-03 DIAGNOSIS — Z959 Presence of cardiac and vascular implant and graft, unspecified: Secondary | ICD-10-CM

## 2015-10-03 DIAGNOSIS — L089 Local infection of the skin and subcutaneous tissue, unspecified: Secondary | ICD-10-CM | POA: Diagnosis not present

## 2015-10-03 HISTORY — PX: CARDIAC CATHETERIZATION: SHX172

## 2015-10-03 LAB — URINALYSIS, ROUTINE W REFLEX MICROSCOPIC
Glucose, UA: NEGATIVE mg/dL
HGB URINE DIPSTICK: NEGATIVE
KETONES UR: NEGATIVE mg/dL
Nitrite: NEGATIVE
PH: 5 (ref 5.0–8.0)
PROTEIN: NEGATIVE mg/dL
Specific Gravity, Urine: 1.028 (ref 1.005–1.030)

## 2015-10-03 LAB — CBC WITH DIFFERENTIAL/PLATELET
BASOS ABS: 0 10*3/uL (ref 0.0–0.1)
Basophils Relative: 0 %
EOS PCT: 0 %
Eosinophils Absolute: 0 10*3/uL (ref 0.0–0.7)
HEMATOCRIT: 45.2 % (ref 39.0–52.0)
HEMOGLOBIN: 14.8 g/dL (ref 13.0–17.0)
LYMPHS ABS: 1.5 10*3/uL (ref 0.7–4.0)
LYMPHS PCT: 9 %
MCH: 32.4 pg (ref 26.0–34.0)
MCHC: 32.7 g/dL (ref 30.0–36.0)
MCV: 98.9 fL (ref 78.0–100.0)
Monocytes Absolute: 2.3 10*3/uL — ABNORMAL HIGH (ref 0.1–1.0)
Monocytes Relative: 14 %
NEUTROS ABS: 13 10*3/uL — AB (ref 1.7–7.7)
NEUTROS PCT: 77 %
PLATELETS: 170 10*3/uL (ref 150–400)
RBC: 4.57 MIL/uL (ref 4.22–5.81)
RDW: 13.3 % (ref 11.5–15.5)
WBC: 16.8 10*3/uL — AB (ref 4.0–10.5)

## 2015-10-03 LAB — URINE MICROSCOPIC-ADD ON

## 2015-10-03 LAB — I-STAT CG4 LACTIC ACID, ED: Lactic Acid, Venous: 3.32 mmol/L (ref 0.5–2.0)

## 2015-10-03 LAB — PROTIME-INR
INR: 1.14 (ref 0.00–1.49)
PROTHROMBIN TIME: 14.8 s (ref 11.6–15.2)

## 2015-10-03 LAB — COMPREHENSIVE METABOLIC PANEL
ALBUMIN: 3.9 g/dL (ref 3.5–5.0)
ALK PHOS: 64 U/L (ref 38–126)
ALT: 76 U/L — ABNORMAL HIGH (ref 17–63)
ANION GAP: 10 (ref 5–15)
AST: 359 U/L — AB (ref 15–41)
BILIRUBIN TOTAL: 1 mg/dL (ref 0.3–1.2)
BUN: 34 mg/dL — AB (ref 6–20)
CALCIUM: 9.3 mg/dL (ref 8.9–10.3)
CO2: 24 mmol/L (ref 22–32)
Chloride: 110 mmol/L (ref 101–111)
Creatinine, Ser: 1.47 mg/dL — ABNORMAL HIGH (ref 0.61–1.24)
GFR calc Af Amer: 46 mL/min — ABNORMAL LOW (ref >60)
GFR calc non Af Amer: 40 mL/min — ABNORMAL LOW (ref >60)
GLUCOSE: 159 mg/dL — AB (ref 65–99)
Potassium: 4.8 mmol/L (ref 3.5–5.1)
SODIUM: 144 mmol/L (ref 135–145)
Total Protein: 6.8 g/dL (ref 6.5–8.1)

## 2015-10-03 LAB — TROPONIN I: Troponin I: >65 ng/mL (ref <0.031)

## 2015-10-03 LAB — APTT: APTT: 31 s (ref 24–37)

## 2015-10-03 LAB — GLUCOSE, CAPILLARY: Glucose-Capillary: 143 mg/dL — ABNORMAL HIGH (ref 65–99)

## 2015-10-03 SURGERY — LEFT HEART CATH AND CORONARY ANGIOGRAPHY
Anesthesia: LOCAL

## 2015-10-03 MED ORDER — NITROGLYCERIN 1 MG/10 ML FOR IR/CATH LAB
INTRA_ARTERIAL | Status: AC
  Filled 2015-10-03: qty 10

## 2015-10-03 MED ORDER — SODIUM CHLORIDE 0.9 % IV BOLUS (SEPSIS): 1000.0000 mL | Freq: Once | INTRAVENOUS | Status: DC

## 2015-10-03 MED ORDER — AMIODARONE HCL 150 MG/3ML IV SOLN
INTRAVENOUS | Status: AC
  Filled 2015-10-03: qty 3

## 2015-10-03 MED ORDER — IOHEXOL 350 MG/ML SOLN
INTRAVENOUS | Status: DC | PRN
  Administered 2015-10-03: 145 mL via INTRA_ARTERIAL

## 2015-10-03 MED ORDER — VERAPAMIL HCL 2.5 MG/ML IV SOLN
INTRAVENOUS | Status: AC
  Filled 2015-10-03: qty 2

## 2015-10-03 MED ORDER — LIDOCAINE HCL (PF) 1 % IJ SOLN
INTRAMUSCULAR | Status: DC | PRN
  Administered 2015-10-03: 5 mL
  Administered 2015-10-03: 15 mL

## 2015-10-03 MED ORDER — VERAPAMIL HCL 2.5 MG/ML IV SOLN
INTRAVENOUS | Status: DC | PRN
  Administered 2015-10-03: 22:00:00 via INTRA_ARTERIAL

## 2015-10-03 MED ORDER — HEPARIN (PORCINE) IN NACL 2-0.9 UNIT/ML-% IJ SOLN
INTRAMUSCULAR | Status: DC | PRN
  Administered 2015-10-03: 1500 mL

## 2015-10-03 MED ORDER — HEPARIN (PORCINE) IN NACL 100-0.45 UNIT/ML-% IJ SOLN
1100.0000 [IU]/h | INTRAMUSCULAR | Status: DC
  Filled 2015-10-03: qty 250

## 2015-10-03 MED ORDER — NOREPINEPHRINE BITARTRATE 1 MG/ML IV SOLN
4.0000 mg | INTRAVENOUS | Status: DC | PRN
  Administered 2015-10-03: 3 ug/min via INTRAVENOUS

## 2015-10-03 MED ORDER — FUROSEMIDE 10 MG/ML IJ SOLN
INTRAMUSCULAR | Status: AC
  Filled 2015-10-03: qty 4

## 2015-10-03 MED ORDER — LIDOCAINE HCL (PF) 1 % IJ SOLN
INTRAMUSCULAR | Status: AC
  Filled 2015-10-03: qty 30

## 2015-10-03 MED ORDER — HEPARIN BOLUS VIA INFUSION
2000.0000 [IU] | Freq: Once | INTRAVENOUS | Status: AC
Start: 2015-10-03 — End: 2015-10-03
  Administered 2015-10-03: 4000 [IU] via INTRAVENOUS
  Filled 2015-10-03: qty 2000

## 2015-10-03 MED ORDER — ASPIRIN 81 MG PO CHEW
324.0000 mg | CHEWABLE_TABLET | Freq: Once | ORAL | Status: AC
  Administered 2015-10-03: 324 mg via ORAL
  Filled 2015-10-03: qty 4

## 2015-10-03 MED ORDER — NOREPINEPHRINE BITARTRATE 1 MG/ML IV SOLN
INTRAVENOUS | Status: AC
  Filled 2015-10-03: qty 4

## 2015-10-03 MED ORDER — SODIUM CHLORIDE 0.9 % IV SOLN
Freq: Once | INTRAVENOUS | Status: AC
  Administered 2015-10-03: 21:00:00 via INTRAVENOUS

## 2015-10-03 MED ORDER — NITROGLYCERIN 1 MG/10 ML FOR IR/CATH LAB
INTRA_ARTERIAL | Status: DC | PRN
  Administered 2015-10-03: 200 ug via INTRACORONARY

## 2015-10-03 MED ORDER — SODIUM CHLORIDE 0.9 % IV BOLUS (SEPSIS): 500.0000 mL | Freq: Once | INTRAVENOUS | Status: DC

## 2015-10-03 MED ORDER — HEPARIN SODIUM (PORCINE) 1000 UNIT/ML IJ SOLN
INTRAMUSCULAR | Status: AC
  Filled 2015-10-03: qty 1

## 2015-10-03 MED ORDER — HEPARIN SODIUM (PORCINE) 1000 UNIT/ML IJ SOLN
INTRAMUSCULAR | Status: DC | PRN
  Administered 2015-10-03: 7000 [IU] via INTRAVENOUS

## 2015-10-03 MED ORDER — HEPARIN (PORCINE) IN NACL 2-0.9 UNIT/ML-% IJ SOLN
INTRAMUSCULAR | Status: AC
  Filled 2015-10-03: qty 1000

## 2015-10-03 MED ORDER — TICAGRELOR 90 MG PO TABS
ORAL_TABLET | ORAL | Status: DC | PRN
  Administered 2015-10-03: 180 mg via ORAL

## 2015-10-03 MED ORDER — AMIODARONE HCL 150 MG/3ML IV SOLN
INTRAVENOUS | Status: DC | PRN
  Administered 2015-10-03: 150 mg via INTRAVENOUS

## 2015-10-03 MED ORDER — SODIUM CHLORIDE 0.9 % IV BOLUS (SEPSIS)
1000.0000 mL | Freq: Once | INTRAVENOUS | Status: AC
  Administered 2015-10-03: 1000 mL via INTRAVENOUS

## 2015-10-03 MED ORDER — FUROSEMIDE 10 MG/ML IJ SOLN
INTRAMUSCULAR | Status: DC | PRN
  Administered 2015-10-03: 40 mg via INTRAVENOUS

## 2015-10-03 MED ORDER — TICAGRELOR 90 MG PO TABS
ORAL_TABLET | ORAL | Status: AC
  Filled 2015-10-03: qty 2

## 2015-10-03 SURGICAL SUPPLY — 22 items
BALLN EUPHORA RX 2.5X15 (BALLOONS) ×2
BALLN LINEAR 7.5FR IABP 40CC (BALLOONS) ×2
BALLOON EUPHORA RX 2.5X15 (BALLOONS) IMPLANT
BALLOON LINEAR 7.5FR IABP 40CC (BALLOONS) IMPLANT
CATH INFINITI JR4 5F (CATHETERS) ×2 IMPLANT
CATH VISTA GUIDE 6FR XBLAD3.5 (CATHETERS) ×1 IMPLANT
DEVICE RAD COMP TR BAND LRG (VASCULAR PRODUCTS) ×2 IMPLANT
DEVICE SECURE STATLOCK IABP (MISCELLANEOUS) ×2 IMPLANT
GLIDESHEATH SLEND SS 6F .021 (SHEATH) ×2 IMPLANT
KIT ENCORE 26 ADVANTAGE (KITS) ×1 IMPLANT
KIT ESSENTIALS PG (KITS) ×1 IMPLANT
KIT HEART LEFT (KITS) ×2 IMPLANT
PACK CARDIAC CATHETERIZATION (CUSTOM PROCEDURE TRAY) ×2 IMPLANT
SHEATH PINNACLE 6F 10CM (SHEATH) ×2 IMPLANT
STENT PROMUS PREM MR 2.25X24 (Permanent Stent) ×1 IMPLANT
STENT PROMUS PREM MR 3.0X16 (Permanent Stent) ×1 IMPLANT
TRANSDUCER W/STOPCOCK (MISCELLANEOUS) ×2 IMPLANT
TUBING CIL FLEX 10 FLL-RA (TUBING) ×2 IMPLANT
WIRE ASAHI PROWATER 180CM (WIRE) ×2 IMPLANT
WIRE EMERALD 3MM-J .035X150CM (WIRE) ×1 IMPLANT
WIRE HI TORQ WHISPER MS 190CM (WIRE) ×1 IMPLANT
WIRE SAFE-T 1.5MM-J .035X260CM (WIRE) ×2 IMPLANT

## 2015-10-03 NOTE — Progress Notes (Signed)
ANTICOAGULATION CONSULT NOTE - Initial Consult  Pharmacy Consult for Heparin Indication: chest pain/ACS  No Known Allergies  Patient Measurements:    Height: 67 inches Weight: 75 kg IBW: 66kg Heparin Dosing Weight: 75kg  Vital Signs: Temp: 98.3 F (36.8 C) (02/11 1823) Temp Source: Oral (02/11 1823) BP: 94/59 mmHg (02/11 1823) Pulse Rate: 92 (02/11 1823)  Labs:  Recent Labs  10/03/15 1856  HGB 14.8  HCT 45.2  PLT 170  CREATININE 1.47*  TROPONINI >65.00*    CrCl cannot be calculated (Unknown ideal weight.).   Medical History: Past Medical History  Diagnosis Date  . Diverticulitis     with colonoscopy in 11/2010-Diverticula and polyp whioch was a non-malignant tubuular adenoma  . HTN (hypertension)   . HLD (hyperlipidemia)   . Degenerative disc disease   . Diabetes mellitus ([place])   . Glaucoma     Medications:  Scheduled:   Infusions:  . sodium chloride     PRN:   Assessment: 82 yo male presents with weakness s/p fall today without injury.  Troponin >65, EKG shows probable old anterolateral infarct.  Pharmacy consulted to dose IV heparin.  Goal of Therapy:  Heparin level 0.3-0.7 units/ml Monitor platelets by anticoagulation protocol: Yes   Plan:   Heparin 2000 units IV bolus  Heparin 1100 units/hr IV infusion  Check heparin level in 8 hrs  Daily heparin level and CBC  Peggyann Juba, PharmD, BCPS Pager: [phone] 10/03/2015,8:55 PM

## 2015-10-03 NOTE — ED Notes (Signed)
Dr. Venita Sheffield updated on plan of care. Awaiting cath lab team

## 2015-10-03 NOTE — ED Notes (Signed)
Daughter updated 

## 2015-10-03 NOTE — ED Notes (Signed)
Per [facility] pt from home co gl weakness, had fall today . No injury. Per ems pt is hypotensive 90/56. Denies any pain nor urinary symptoms. Alert and oriented x 4.

## 2015-10-03 NOTE — ED Provider Notes (Signed)
Patient transferred from [facility] long hospital for ST segment elevation MI. Patient awaiting second cath team.  Results for orders placed or performed during the hospital encounter of 10/03/15  CBC with Differential/Platelet  Result Value Ref Range   WBC 16.8 (H) 4.0 - 10.5 K/uL   RBC 4.57 4.22 - 5.81 MIL/uL   Hemoglobin 14.8 13.0 - 17.0 g/dL   HCT 45.2 39.0 - 52.0 %   MCV 98.9 78.0 - 100.0 fL   MCH 32.4 26.0 - 34.0 pg   MCHC 32.7 30.0 - 36.0 g/dL   RDW 13.3 11.5 - 15.5 %   Platelets 170 150 - 400 K/uL   Neutrophils Relative % 77 %   Neutro Abs 13.0 (H) 1.7 - 7.7 K/uL   Lymphocytes Relative 9 %   Lymphs Abs 1.5 0.7 - 4.0 K/uL   Monocytes Relative 14 %   Monocytes Absolute 2.3 (H) 0.1 - 1.0 K/uL   Eosinophils Relative 0 %   Eosinophils Absolute 0.0 0.0 - 0.7 K/uL   Basophils Relative 0 %   Basophils Absolute 0.0 0.0 - 0.1 K/uL  Comprehensive metabolic panel  Result Value Ref Range   Sodium 144 135 - 145 mmol/L   Potassium 4.8 3.5 - 5.1 mmol/L   Chloride 110 101 - 111 mmol/L   CO2 24 22 - 32 mmol/L   Glucose, Bld 159 (H) 65 - 99 mg/dL   BUN 34 (H) 6 - 20 mg/dL   Creatinine, Ser 1.47 (H) 0.61 - 1.24 mg/dL   Calcium 9.3 8.9 - 10.3 mg/dL   Total Protein 6.8 6.5 - 8.1 g/dL   Albumin 3.9 3.5 - 5.0 g/dL   AST 359 (H) 15 - 41 U/L   ALT 76 (H) 17 - 63 U/L   Alkaline Phosphatase 64 38 - 126 U/L   Total Bilirubin 1.0 0.3 - 1.2 mg/dL   GFR calc non Af Amer 40 (L) >60 mL/min   GFR calc Af Amer 46 (L) >60 mL/min   Anion gap 10 5 - 15  Urinalysis, Routine w reflex microscopic (not at [facility])  Result Value Ref Range   Color, Urine AMBER (A) YELLOW   APPearance CLOUDY (A) CLEAR   Specific Gravity, Urine 1.028 1.005 - 1.030   pH 5.0 5.0 - 8.0   Glucose, UA NEGATIVE NEGATIVE mg/dL   Hgb urine dipstick NEGATIVE NEGATIVE   Bilirubin Urine SMALL (A) NEGATIVE   Ketones, ur NEGATIVE NEGATIVE mg/dL   Protein, ur NEGATIVE NEGATIVE mg/dL   Nitrite NEGATIVE NEGATIVE   Leukocytes, UA SMALL (A)  NEGATIVE  Troponin I  Result Value Ref Range   Troponin I >65.00 (HH) <0.031 ng/mL  APTT  Result Value Ref Range   aPTT 31 24 - 37 seconds  Protime-INR  Result Value Ref Range   Prothrombin Time 14.8 11.6 - 15.2 seconds   INR 1.14 0.00 - 1.49  Troponin I  Result Value Ref Range   Troponin I >65.00 (HH) <0.031 ng/mL  Urine microscopic-add on  Result Value Ref Range   Squamous Epithelial / LPF 0-5 (A) NONE SEEN   WBC, UA TOO NUMEROUS TO COUNT 0 - 5 WBC/hpf   RBC / HPF 0-5 0 - 5 RBC/hpf   Bacteria, UA FEW (A) NONE SEEN  I-Stat CG4 Lactic Acid, ED  Result Value Ref Range   Lactic Acid, Venous 3.32 (HH) 0.5 - 2.0 mmol/L   Comment NOTIFIED PHYSICIAN    Dg Chest 2 View  10/03/2015  CLINICAL DATA:  Fall  today and generalized weakness EXAM: CHEST  2 VIEW COMPARISON:  11/28/2013 FINDINGS: Cardiac shadow is mildly enlarged but stable. Aortic calcifications are again seen. The lungs are well aerated bilaterally. Mild interstitial changes are noted within both lungs. No focal confluent infiltrate is seen. No acute bony abnormality is noted. IMPRESSION: No acute abnormality noted. Electronically Signed   By: Inez Catalina M.D.   On: 10/03/2015 18:59      Fredia Sorrow, MD 10/03/15 2206

## 2015-10-03 NOTE — ED Notes (Signed)
Patient seen at [facility] for weakness, fell at home.  Only symptom of shortness of breath, silent MI 6 years ago.

## 2015-10-03 NOTE — ED Provider Notes (Signed)
CSN: [id number]     Arrival date & time 10/03/15  1758 History   First MD Initiated Contact with Patient 10/03/15 1815     Chief Complaint  Patient presents with  . Weakness     (Consider location/radiation/quality/duration/timing/severity/associated sxs/prior Treatment) Patient is a 82 y.o. male presenting with weakness. The history is provided by the patient.  Weakness This is a recurrent problem. The current episode started 1 to 2 hours ago (legs "gave way" while using walker going to couch, wife couldn't intervene, slumped to floor). The problem occurs constantly. The problem has been gradually improving. Pertinent negatives include no chest pain, no abdominal pain and no shortness of breath. Nothing aggravates the symptoms. Nothing relieves the symptoms. He has tried nothing for the symptoms.    Past Medical History  Diagnosis Date  . Diverticulitis     with colonoscopy in 11/2010-Diverticula and polyp whioch was a non-malignant tubuular adenoma  . HTN (hypertension)   . HLD (hyperlipidemia)   . Degenerative disc disease   . Diabetes mellitus ([place])   . Glaucoma    Past Surgical History  Procedure Laterality Date  . Colonoscopy  4/12   Family History  Problem Relation Age of Onset  . Diabetes Mother   . Diabetes Maternal Grandmother   . Kidney disease Maternal Grandfather    Social History  Substance Use Topics  . Smoking status: Former Smoker -- 1.00 packs/day for 5 years    Quit date: 08/23/1971  . Smokeless tobacco: None  . Alcohol Use: No    Review of Systems  Constitutional: Positive for fatigue. Negative for fever, activity change and appetite change.  Respiratory: Negative for shortness of breath.   Cardiovascular: Negative for chest pain, palpitations and leg swelling.  Gastrointestinal: Negative for nausea, vomiting, abdominal pain and diarrhea.  Neurological: Positive for weakness.      Allergies  Review of patient's allergies indicates no known  allergies.  Home Medications   Prior to Admission medications   Medication Sig Start Date End Date Taking? Authorizing Provider  cholecalciferol (VITAMIN D) 400 UNITS TABS tablet Take 400 Units by mouth daily.   Yes Historical Provider, MD  lisinopril (PRINIVIL,ZESTRIL) 10 MG tablet Take 1 tablet (10 mg total) by mouth daily. 06/12/14  Yes Jerline Pain, MD   BP 94/59 mmHg  Pulse 92  Temp(Src) 98.3 F (36.8 C) (Oral)  Resp 22  SpO2 94% Physical Exam  Constitutional: He is oriented to person, place, and time. He appears well-developed and well-nourished. No distress.  HENT:  Head: Normocephalic and atraumatic.  Eyes: Conjunctivae are normal.  Neck: Neck supple. No tracheal deviation present.  Cardiovascular: Normal rate, regular rhythm and normal heart sounds.   Pulmonary/Chest: Effort normal. No respiratory distress. He has no wheezes. He has rales (bibasilar).  Abdominal: Soft. He exhibits no distension. There is no tenderness.  Neurological: He is alert and oriented to person, place, and time. He has normal strength. Coordination normal. GCS eye subscore is 4. GCS verbal subscore is 5. GCS motor subscore is 6.  Skin: Skin is warm and dry.  Psychiatric: He has a normal mood and affect.  Vitals reviewed.   ED Course  Procedures (including critical care time)  CRITICAL CARE Performed by: Leo Grosser Total critical care time: 30 minutes Critical care time was exclusive of separately billable procedures and treating other patients. Critical care was necessary to treat or prevent imminent or life-threatening deterioration. Critical care was time spent personally by me on the  following activities: development of treatment plan with patient and/or surrogate as well as nursing, discussions with consultants, evaluation of patient's response to treatment, examination of patient, obtaining history from patient or surrogate, ordering and performing treatments and interventions, ordering  and review of laboratory studies, ordering and review of radiographic studies, pulse oximetry and re-evaluation of patient's condition.   Labs Review Labs Reviewed  CBC WITH DIFFERENTIAL/PLATELET - Abnormal; Notable for the following:    WBC 16.8 (*)    Neutro Abs 13.0 (*)    Monocytes Absolute 2.3 (*)    All other components within normal limits  COMPREHENSIVE METABOLIC PANEL - Abnormal; Notable for the following:    Glucose, Bld 159 (*)    BUN 34 (*)    Creatinine, Ser 1.47 (*)    AST 359 (*)    ALT 76 (*)    GFR calc non Af Amer 40 (*)    GFR calc Af Amer 46 (*)    All other components within normal limits  URINALYSIS, ROUTINE W REFLEX MICROSCOPIC (NOT AT [facility]) - Abnormal; Notable for the following:    Color, Urine AMBER (*)    APPearance CLOUDY (*)    Bilirubin Urine SMALL (*)    Leukocytes, UA SMALL (*)    All other components within normal limits  TROPONIN I - Abnormal; Notable for the following:    Troponin I >65.00 (*)    All other components within normal limits  TROPONIN I - Abnormal; Notable for the following:    Troponin I >65.00 (*)    All other components within normal limits  URINE MICROSCOPIC-ADD ON - Abnormal; Notable for the following:    Squamous Epithelial / LPF 0-5 (*)    Bacteria, UA FEW (*)    All other components within normal limits  GLUCOSE, CAPILLARY - Abnormal; Notable for the following:    Glucose-Capillary 143 (*)    All other components within normal limits  I-STAT CG4 LACTIC ACID, ED - Abnormal; Notable for the following:    Lactic Acid, Venous 3.32 (*)    All other components within normal limits  URINE CULTURE  APTT  PROTIME-INR  HEPARIN LEVEL (UNFRACTIONATED)  CBC    Imaging Review Dg Chest 2 View  10/03/2015  CLINICAL DATA:  Fall today and generalized weakness EXAM: CHEST  2 VIEW COMPARISON:  11/28/2013 FINDINGS: Cardiac shadow is mildly enlarged but stable. Aortic calcifications are again seen. The lungs are well aerated  bilaterally. Mild interstitial changes are noted within both lungs. No focal confluent infiltrate is seen. No acute bony abnormality is noted. IMPRESSION: No acute abnormality noted. Electronically Signed   By: Inez Catalina M.D.   On: 10/03/2015 18:59   I have personally reviewed and evaluated these images and lab results as part of my medical decision-making.  #1 on arrival  EKG Interpretation  Date/Time:  Saturday October 03 2015 18:22:33 EST Ventricular Rate:  90 PR Interval:  163 QRS Duration: 103 QT Interval:  374 QTC Calculation: 458 R Axis:   -113 Text Interpretation:  Sinus rhythm Markedly posterior QRS axis Low  voltage, extremity and precordial leads Probable anterolateral infarct,  old Abnormal T, consider ischemia, lateral leads Baseline wander in  lead(s) V3 No significant change since last tracing Confirmed by Torrell Krutz MD,  Travonte Byard [id number]) on 10/03/2015 6:37:56 PM   #2 after labs resulted, no new symptoms  EKG Interpretation  Date/Time:  Saturday October 03 2015 20:51:52 EST Ventricular Rate:  110 PR Interval:  177 QRS  Duration: 103 QT Interval:  342 QTC Calculation: 463 R Axis:   -69 Text Interpretation:  ** ** ACUTE MI / STEMI ** ** ST elevation in lateral leads Reciprocal depression in inferior and precordial  leads Confirmed by Leman Martinek MD, Tabitha Tupper [id number]) on 10/03/2015 9:05:18 PM        MDM   Final diagnoses:  Silent ST elevation myocardial infarction (STEMI) [facility])    82 y.o. male presents with  Episode of weakness where he was walking to the couch and he felt his legs become week suddenly. He then slumped to the ground before his wife was able to get him. He normally ambulates with a walker at baseline. On arrival he is in good spirits, has a mildly low blood pressure and has had discussions previously with his cardiologist regarding discontinuation of lisinopril if his blood pressure continues to run low. His initial EKG is consistent with previous readings and  less than 1 mm upsloping depression in inferior leads. Troponin was added to workup as patient has had prior silent MI.   As lab work is returning patient has leukocytosis, lactic acidosis, and dramatically elevated troponin above the limits of the laboratory value concerning for atypical ACS. I discussed the initial EKG in the patient's current status with Dr. Martinique who agreed that the first EKG did not represent an ST elevation MI nor did the patient qualified for catheterization emergently at that time. He recommended medical admission. EKG was repeated and demonstrates elevation in lateral leads with reciprocal depressions. I  Activated STEMI protocol and reengaged Dr. Martinique who agrees this represents likely ST elevation MI and the case was discussed with Dr. Saunders Revel who came to the emergency department at [facility] to discuss recent benefits of catheterization and the patient's advanced age. Following discussion with the patient he wished to proceed with catheterization despite the risks of the procedure and ongoing medications.  The patient was monitored in the emergency department with some increasing oxygen requirement likely related to cardiogenic shock and variable blood pressures but not requiring pressors to maintain his mean arterial pressure. Patient was transferred to [facility] for cardiac catheterization under the care of the cardiology team.  I had a discussion with the patient regarding possibility of cardiac arrest and low likelihood of survival if this occurs. The patient expressed understanding and after deliberation decided on DO NOT RESUSCITATE status. The patient is lucid and capable of decision-making currently. I updated the patient's daughter regarding the care plan going forward who is on the way from [facility].    Leo Grosser, MD 10/03/15 2255

## 2015-10-04 ENCOUNTER — Encounter (HOSPITAL_COMMUNITY): Payer: Self-pay | Admitting: *Deleted

## 2015-10-04 ENCOUNTER — Inpatient Hospital Stay (HOSPITAL_COMMUNITY): Payer: Medicare Other

## 2015-10-04 DIAGNOSIS — I2121 ST elevation (STEMI) myocardial infarction involving left circumflex coronary artery: Secondary | ICD-10-CM

## 2015-10-04 DIAGNOSIS — R570 Cardiogenic shock: Secondary | ICD-10-CM

## 2015-10-04 DIAGNOSIS — I213 ST elevation (STEMI) myocardial infarction of unspecified site: Secondary | ICD-10-CM

## 2015-10-04 LAB — GLUCOSE, CAPILLARY
GLUCOSE-CAPILLARY: 192 mg/dL — AB (ref 65–99)
GLUCOSE-CAPILLARY: 105 mg/dL — AB (ref 65–99)
Glucose-Capillary: 126 mg/dL — ABNORMAL HIGH (ref 65–99)
Glucose-Capillary: 134 mg/dL — ABNORMAL HIGH (ref 65–99)

## 2015-10-04 LAB — TROPONIN I: Troponin I: >65 ng/mL (ref <0.031)

## 2015-10-04 LAB — BASIC METABOLIC PANEL
Anion gap: 8 (ref 5–15)
BUN: 30 mg/dL — ABNORMAL HIGH (ref 6–20)
CALCIUM: 8.6 mg/dL — AB (ref 8.9–10.3)
CO2: 21 mmol/L — AB (ref 22–32)
CREATININE: 1.31 mg/dL — AB (ref 0.61–1.24)
Chloride: 112 mmol/L — ABNORMAL HIGH (ref 101–111)
GFR calc Af Amer: 53 mL/min — ABNORMAL LOW (ref >60)
GFR calc non Af Amer: 46 mL/min — ABNORMAL LOW (ref >60)
GLUCOSE: 163 mg/dL — AB (ref 65–99)
Potassium: 4.5 mmol/L (ref 3.5–5.1)
Sodium: 141 mmol/L (ref 135–145)

## 2015-10-04 LAB — APTT: aPTT: 32 seconds (ref 24–37)

## 2015-10-04 LAB — CBC
HCT: 44 % (ref 39.0–52.0)
Hemoglobin: 14.6 g/dL (ref 13.0–17.0)
MCH: 33 pg (ref 26.0–34.0)
MCHC: 33.2 g/dL (ref 30.0–36.0)
MCV: 99.5 fL (ref 78.0–100.0)
PLATELETS: 167 10*3/uL (ref 150–400)
RBC: 4.42 MIL/uL (ref 4.22–5.81)
RDW: 13.5 % (ref 11.5–15.5)
WBC: 22.9 10*3/uL — AB (ref 4.0–10.5)

## 2015-10-04 LAB — LIPID PANEL
Cholesterol: 151 mg/dL (ref 0–200)
HDL: 39 mg/dL — AB (ref >40)
LDL CALC: 95 mg/dL (ref 0–99)
Total CHOL/HDL Ratio: 3.9 RATIO
Triglycerides: 84 mg/dL (ref <150)
VLDL: 17 mg/dL (ref 0–40)

## 2015-10-04 LAB — HEPARIN LEVEL (UNFRACTIONATED): Heparin Unfractionated: <0.1 IU/mL — ABNORMAL LOW (ref 0.30–0.70)

## 2015-10-04 LAB — POCT ACTIVATED CLOTTING TIME: Activated Clotting Time: 224 seconds

## 2015-10-04 LAB — MRSA PCR SCREENING: MRSA BY PCR: NEGATIVE

## 2015-10-04 MED ORDER — HEPARIN (PORCINE) IN NACL 100-0.45 UNIT/ML-% IJ SOLN
1100.0000 [IU]/h | INTRAMUSCULAR | Status: DC
  Administered 2015-10-05: 1100 [IU]/h via INTRAVENOUS
  Filled 2015-10-04: qty 250

## 2015-10-04 MED ORDER — HEPARIN (PORCINE) IN NACL 100-0.45 UNIT/ML-% IJ SOLN
850.0000 [IU]/h | INTRAMUSCULAR | Status: DC
  Administered 2015-10-04: 850 [IU]/h via INTRAVENOUS
  Filled 2015-10-04: qty 250

## 2015-10-04 MED ORDER — SODIUM CHLORIDE 0.9% FLUSH: 3.0000 mL | INTRAVENOUS | Status: DC | PRN

## 2015-10-04 MED ORDER — FUROSEMIDE 10 MG/ML IJ SOLN
40.0000 mg | Freq: Two times a day (BID) | INTRAMUSCULAR | Status: DC
  Administered 2015-10-04 (×2): 40 mg via INTRAVENOUS
  Filled 2015-10-04: qty 4

## 2015-10-04 MED ORDER — SODIUM CHLORIDE 0.9% FLUSH
3.0000 mL | Freq: Two times a day (BID) | INTRAVENOUS | Status: DC
  Administered 2015-10-04 – 2015-10-15 (×18): 3 mL via INTRAVENOUS

## 2015-10-04 MED ORDER — ATORVASTATIN CALCIUM 80 MG PO TABS
80.0000 mg | ORAL_TABLET | Freq: Every day | ORAL | Status: DC
  Administered 2015-10-04 – 2015-10-19 (×16): 80 mg via ORAL
  Filled 2015-10-04 (×15): qty 1

## 2015-10-04 MED ORDER — "THROMBI-PAD 3""X3"" EX PADS"
1.0000 | MEDICATED_PAD | Freq: Once | CUTANEOUS | Status: AC
  Administered 2015-10-04: 1 via TOPICAL
  Filled 2015-10-04: qty 1

## 2015-10-04 MED ORDER — ONDANSETRON HCL 4 MG/2ML IJ SOLN: 4.0000 mg | Freq: Four times a day (QID) | INTRAMUSCULAR | Status: DC | PRN

## 2015-10-04 MED ORDER — INSULIN ASPART 100 UNIT/ML ~~LOC~~ SOLN
0.0000 [IU] | Freq: Three times a day (TID) | SUBCUTANEOUS | Status: DC
  Administered 2015-10-04: 2 [IU] via SUBCUTANEOUS
  Administered 2015-10-04: 3 [IU] via SUBCUTANEOUS
  Administered 2015-10-04 – 2015-10-05 (×4): 2 [IU] via SUBCUTANEOUS
  Administered 2015-10-06 (×3): 3 [IU] via SUBCUTANEOUS
  Administered 2015-10-07 (×3): 2 [IU] via SUBCUTANEOUS
  Administered 2015-10-08: 3 [IU] via SUBCUTANEOUS
  Administered 2015-10-08: 2 [IU] via SUBCUTANEOUS
  Administered 2015-10-08: 3 [IU] via SUBCUTANEOUS
  Administered 2015-10-09: 2 [IU] via SUBCUTANEOUS
  Administered 2015-10-09: 5 [IU] via SUBCUTANEOUS
  Administered 2015-10-09: 2 [IU] via SUBCUTANEOUS
  Administered 2015-10-10 (×2): 3 [IU] via SUBCUTANEOUS
  Administered 2015-10-11: 2 [IU] via SUBCUTANEOUS
  Administered 2015-10-11: 3 [IU] via SUBCUTANEOUS
  Administered 2015-10-11: 2 [IU] via SUBCUTANEOUS
  Administered 2015-10-12 (×2): 3 [IU] via SUBCUTANEOUS
  Administered 2015-10-13 – 2015-10-14 (×5): 2 [IU] via SUBCUTANEOUS
  Administered 2015-10-14 – 2015-10-15 (×2): 3 [IU] via SUBCUTANEOUS
  Administered 2015-10-16: 2 [IU] via SUBCUTANEOUS
  Administered 2015-10-16: 3 [IU] via SUBCUTANEOUS
  Administered 2015-10-17 (×2): 2 [IU] via SUBCUTANEOUS
  Administered 2015-10-18: 3 [IU] via SUBCUTANEOUS
  Administered 2015-10-18 – 2015-10-20 (×3): 2 [IU] via SUBCUTANEOUS

## 2015-10-04 MED ORDER — CETYLPYRIDINIUM CHLORIDE 0.05 % MT LIQD
7.0000 mL | Freq: Two times a day (BID) | OROMUCOSAL | Status: DC
  Administered 2015-10-04 – 2015-10-20 (×33): 7 mL via OROMUCOSAL

## 2015-10-04 MED ORDER — SODIUM CHLORIDE 0.9 % IV SOLN
250.0000 mL | INTRAVENOUS | Status: DC | PRN
  Administered 2015-10-05: 10 mL/h via INTRAVENOUS

## 2015-10-04 MED ORDER — SODIUM CHLORIDE 0.9 % IV SOLN: 250.0000 mL | INTRAVENOUS | Status: DC | PRN

## 2015-10-04 MED ORDER — NITROGLYCERIN 0.4 MG SL SUBL
0.4000 mg | SUBLINGUAL_TABLET | SUBLINGUAL | Status: DC | PRN
Start: 2015-10-04 — End: 2015-10-20

## 2015-10-04 MED ORDER — SODIUM CHLORIDE 0.9% FLUSH
3.0000 mL | Freq: Two times a day (BID) | INTRAVENOUS | Status: DC
  Administered 2015-10-04 – 2015-10-12 (×14): 3 mL via INTRAVENOUS

## 2015-10-04 MED ORDER — NOREPINEPHRINE BITARTRATE 1 MG/ML IV SOLN
2.0000 ug/min | INTRAVENOUS | Status: DC
  Administered 2015-10-04: 2 ug/min via INTRAVENOUS
  Filled 2015-10-04: qty 4

## 2015-10-04 MED ORDER — SODIUM CHLORIDE 0.9% FLUSH
3.0000 mL | INTRAVENOUS | Status: DC | PRN
  Administered 2015-10-09: 3 mL via INTRAVENOUS
  Filled 2015-10-04: qty 3

## 2015-10-04 MED ORDER — ATORVASTATIN CALCIUM 20 MG PO TABS: 20.0000 mg | ORAL_TABLET | Freq: Every day | ORAL | Status: DC

## 2015-10-04 MED ORDER — ASPIRIN EC 81 MG PO TBEC
81.0000 mg | DELAYED_RELEASE_TABLET | Freq: Every day | ORAL | Status: DC
  Administered 2015-10-04 – 2015-10-20 (×17): 81 mg via ORAL
  Filled 2015-10-04 (×17): qty 1

## 2015-10-04 MED ORDER — POTASSIUM CHLORIDE 20 MEQ PO PACK
20.0000 meq | PACK | Freq: Once | ORAL | Status: AC
  Administered 2015-10-04: 20 meq via ORAL
  Filled 2015-10-04: qty 1

## 2015-10-04 MED ORDER — ACETAMINOPHEN 325 MG PO TABS
650.0000 mg | ORAL_TABLET | ORAL | Status: DC | PRN
  Administered 2015-10-05 – 2015-10-16 (×14): 650 mg via ORAL
  Filled 2015-10-04 (×15): qty 2

## 2015-10-04 MED ORDER — FUROSEMIDE 10 MG/ML IJ SOLN
INTRAMUSCULAR | Status: AC
Start: 2015-10-04 — End: 2015-10-04
  Filled 2015-10-04: qty 4

## 2015-10-04 MED ORDER — TICAGRELOR 90 MG PO TABS
90.0000 mg | ORAL_TABLET | Freq: Two times a day (BID) | ORAL | Status: DC
  Administered 2015-10-04 – 2015-10-20 (×33): 90 mg via ORAL
  Filled 2015-10-04 (×33): qty 1

## 2015-10-04 NOTE — Progress Notes (Signed)
Critical Troponin >65.0 same as prior result. Patient is here from cath lab post stent placement and currently has IABP. Critical lab value expected and without change.

## 2015-10-04 NOTE — H&P (Signed)
Cardiology Consultation Note    Patient ID: GRAESON LINDSTROM MRN: [id number], DOB: 18-Jul-1922 Date of Encounter: 10/04/2015, 3:08 AM Primary Physician: Kandice Hams, MD Primary Cardiologist: Dr. Marlou Porch  Chief Complaint: Shortness of breath Reason for Admission: STEMI  HPI: Mr. 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  The patient was in his usual state of health until yesterday morning, when he fell.  He has experienced several falls in the past, but notes that he is typically able to move about independently with his walker.  After the fall, he felt short of breath and generally weak, prompting him to be taken to the [facility] emergency department.  There, initial EKG was nonspecific but troponin was found to be elevated above 5.  Repeat EKG demonstrated lateral ST segment elevations with reciprocal ST depressions in the inferior leads.  The patient was evaluated in person in the [place] ED, where his only complaint was of persistent shortness of breath.  He denied chest pain at all during the day.  There is some benefits of cardiac catheterization were discussed with Mr. Hon, who agreed to proceed.  He also asked that his DNR be temporarily rescinded for the procedure.  He was therefore transported to [facility] where he underwent emergent PCI to the LCx/obtuse marginal.  He was also found to have significant proximal RCA disease in a chronic total occlusion of the LAD.  Due to his multivessel disease, significantly elevated LVEDP, and respiratory distress with hypoxia during the procedure, an intra-aortic balloon pump was also placed via the right groin.  He also required low-dose norepinephrine for hypotension.  At this time, Mr. Pressnall reports that he is feeling better.   Continues to deny chest pain would also notes that his shortness of breath has improved.  Past Medical History  Diagnosis Date  . Diverticulitis     with colonoscopy in 11/2010-Diverticula and polyp whioch was a non-malignant tubuular adenoma  . HTN (hypertension)   . HLD (hyperlipidemia)   . Degenerative disc disease   . Diabetes mellitus ([place])   . Glaucoma      Surgical History:  Past Surgical History  Procedure Laterality Date  . Colonoscopy  4/12     Home Meds: Prior to Admission medications   Medication Sig Start Date Texanna Hilburn Date Taking? Authorizing Provider  cholecalciferol (VITAMIN D) 400 UNITS TABS tablet Take 400 Units by mouth daily.   Yes Historical Provider, MD  lisinopril (PRINIVIL,ZESTRIL) 10 MG tablet Take 1 tablet (10 mg total) by mouth daily. 06/12/14  Yes Jerline Pain, MD    Allergies: No Known Allergies  Social History   Social History  . Marital Status: Married    Spouse Name: N/A  . Number of Children: N/A  . Years of Education: N/A   Occupational History  . Not on file.   Social History Main Topics  . Smoking status: Former Smoker -- 1.00 packs/day for 5 years    Quit date: 08/23/1971  . Smokeless tobacco: Not on file  . Alcohol Use: No  . Drug Use: No  . Sexual Activity: Not Currently   Other Topics Concern  . Not on file   Social History Narrative   Lives with Wife   Daughter is HCPOA     Family History  Problem Relation Age of Onset  .  Diabetes Mother   . Diabetes Maternal Grandmother   . Kidney disease Maternal Grandfather     Review of Systems: General: negative for chills, fever, night sweats or weight changes.  Cardiovascular: negative for chest pain, edema, orthopnea, and palpitations Dermatological: negative for rash Respiratory: negative for cough or wheezing Urologic: negative for hematuria Abdominal: negative for nausea, vomiting, diarrhea, bright red blood per rectum, melena, or hematemesis Neurologic: negative for  visual changes, syncope, or dizziness; his legs "gave out" at the time of his fall yesterday All other systems reviewed and are otherwise negative except as noted above.  Labs:   Lab Results  Component Value Date   WBC 16.8* 10/03/2015   HGB 14.8 10/03/2015   HCT 45.2 10/03/2015   MCV 98.9 10/03/2015   PLT 170 10/03/2015    Recent Labs Lab 10/03/15 1856  NA 144  K 4.8  CL 110  CO2 24  BUN 34*  CREATININE 1.47*  CALCIUM 9.3  PROT 6.8  BILITOT 1.0  ALKPHOS 64  ALT 76*  AST 359*  GLUCOSE 159*    Recent Labs  10/03/15 1856 10/03/15 2042 10/04/15 0051  TROPONINI >65.00* >65.00* >65.00*   No results found for: CHOL, HDL, LDLCALC, TRIG No results found for: DDIMER  Radiology/Studies:  Dg Chest 2 View  10/03/2015  CLINICAL DATA:  Fall today and generalized weakness EXAM: CHEST  2 VIEW COMPARISON:  11/28/2013 FINDINGS: Cardiac shadow is mildly enlarged but stable. Aortic calcifications are again seen. The lungs are well aerated bilaterally. Mild interstitial changes are noted within both lungs. No focal confluent infiltrate is seen. No acute bony abnormality is noted. IMPRESSION: No acute abnormality noted. Electronically Signed   By: Inez Catalina M.D.   On: 10/03/2015 18:59   Portable Chest X-ray 1 View  10/04/2015  CLINICAL DATA:  Acute onset of shortness of breath. Initial encounter. EXAM: PORTABLE CHEST 1 VIEW COMPARISON:  Chest radiograph performed 10/03/2015 FINDINGS: The lungs are well-aerated. Vascular congestion is noted. Increased interstitial markings raise concern for pulmonary edema. There is no evidence of pleural effusion or pneumothorax. The cardiomediastinal silhouette is mildly enlarged. No acute osseous abnormalities are seen. An intra-aortic balloon pump tip is noted 2 cm below the aortic knob, as expected. IMPRESSION: Vascular congestion and mild cardiomegaly. Increased interstitial markings raise concern for pulmonary edema, new from the prior study.  Electronically Signed   By: Garald Balding M.D.   On: 10/04/2015 00:44   Wt Readings from Last 3 Encounters:  10/04/15 76 kg (167 lb 8.8 oz)  07/24/15 73.936 kg (163 lb)  06/12/14 73.936 kg (163 lb)    EKG: Low voltage with lateral T-wave inversions and nonspecific ST segment changes.  Prior lateral ST segment elevations are improved.  Physical Exam: Blood pressure 114/65, pulse 92, temperature 97.8 F (36.6 C), temperature source Oral, resp. rate 31, height 5\' 7"  (1.702 m), weight 76 kg (167 lb 8.8 oz), SpO2 100 %. Body mass index is 26.24 kg/(m^2). General: Elderly man lying in bed.  He is slightly But otherwise appears comfortable. Head: Normocephalic, atraumatic, sclera non-icteric, no xanthomas, nares are without discharge.  Neck: Negative for carotid bruits.  JVP elevated to the angle of the mandible at 60. Lungs: Diminished breath sounds at both bases.  Patient is tachypnea. Heart: RRR with S1 S2. No murmurs, rubs, or gallops appreciated. Abdomen: Soft, non-tender, non-distended with normoactive bowel sounds. No hepatomegaly. No rebound/guarding. No obvious abdominal masses. Msk:  Strength and tone appear normal for age. Extremities:  Right femoral intra-aortic balloon pump in place with small amount of oozing.  No clubbing or cyanosis. No edema.  Distal pedal pulses are 1+ and equal bilaterally. Neuro: Alert and oriented X 3. No focal deficit. No facial asymmetry. Moves all extremities spontaneously. Psych:  Responds to questions appropriately with a normal affect.    Assessment and Plan  Mr. Bayliss is a 82 year old man admitted with lateral STEMI and found to have severe multivessel coronary artery disease.  STEMI: Patient underwent successful PCI to LCx/OM with placement of intra-aortic balloon pump.  However, his chronic total occlusion of the proximal LAD as well as significant proximal RCA disease may complicate his recovery.  Continue dual antiplatelet therapy with  aspirin and ticagrelor.  Initiate atorvastatin 20 mg daily, given patient's advanced age.  Check fasting lipid panel and hemoglobin A1c.  Plan for transthoracic echocardiogram in the morning.  We will plan to wean the intra-aortic balloon pump as tolerated once the patient's breathing has returned to normal and he remains hemodynamically stable.  IABP position appropriate based on chest radiograph.  We will plan to wean low-dose norepinephrine, as blood pressure tolerates.  Defer initiation of beta blocker and/or ACE inhibitor at this time, given patient's cardiogenic shock requiring norepinephrine and intra-aortic balloon pump support.  Low-dose heparin infusion while intra-aortic balloon pump in place.  Hypertension: Patient currently hypotensive following STEMI.  Hold antihypertensive medication pending improvement of cardiogenic shock.   Diabetes mellitus: Reportedly diet controlled per the patient's family.  Check hemoglobin A1c.  Sliding-scale insulin.  Code status: This was discussed with the patient and his family.  The patient had previously indicated that he would not wish to be resuscitated.  He agreed to rescind this during the procedure and for the time being his asked that he remain full code during the hospitalization.  I encouraged the patient and his family to discuss this and to inform us if they wish to change this to a limited code or DNR/DNI.  Signed, Nelva Bush MD 10/04/2015, 3:08 AM

## 2015-10-04 NOTE — Progress Notes (Signed)
ANTICOAGULATION CONSULT NOTE - Follow-up Consult  Pharmacy Consult for Heparin Indication: s/p cath/PCI, IABP in place  No Known Allergies  Patient Measurements: Height: 5\' 7"  (170.2 cm) Weight: 167 lb 8.8 oz (76 kg) IBW/kg (Calculated) : 66.1  Heparin Dosing Weight: 75kg  Vital Signs: Temp: 99.2 F (37.3 C) (02/12 0737) Temp Source: Axillary (02/12 0737) BP: 96/64 mmHg (02/12 0915) Pulse Rate: 93 (02/12 0915)  Labs:  Recent Labs  10/03/15 1856 10/03/15 2042 10/04/15 0051 10/04/15 0509 10/04/15 0539  HGB 14.8  --   --   --  14.6  HCT 45.2  --   --   --  44.0  PLT 170  --   --   --  167  APTT  --  31  --  32  --   LABPROT  --  14.8  --   --   --   INR  --  1.14  --   --   --   CREATININE 1.47*  --   --  1.31*  --   TROPONINI >65.00* >65.00* >65.00*  --   --     Estimated Creatinine Clearance: 33.6 mL/min (by C-G formula based on Cr of 1.31).  Assessment: 82 yo M with STEMI to [facility] where heparin bolus given; gtt not started - sent emergently to cath lab at [facility] where 3V CAD found and PCI performed. Pt with cardiogenic shock requiring IABP placement. To begin heparin while IABP in place. Drip started then held for bleeding (saturating dressings). Now to continue heparin per cards MD.  Hgb 14.6, PLT 167  Goal of Therapy:  Heparin level 0.2-0.5 units/ml Monitor platelets by anticoagulation protocol: Yes   Plan:  Restart heparin gtt at 850 units/hr (~11 units/kg/hr) Will f/u heparin level in 8 hours Daily heparin level and CBC  Lorren Rossetti C. Lennox Grumbles, PharmD Pharmacy Resident  Pager: [phone] 10/04/2015 10:28 AM

## 2015-10-04 NOTE — Progress Notes (Signed)
Patient continues to bleeding from right groin sheath with frequent dressing changes from saturation. Thrombi pads not affective at this time. Urine noted with pink tinge. Dr. Saunders Revel notified. ACT requested, and resulted 224. Heparin on hold. Dr. Saunders Revel at bedside. Will continue to hold heparin for 1 hour then obtain PTT level. Currently patient resting without complaints of discomfort. Will continue to monitor.

## 2015-10-04 NOTE — Progress Notes (Signed)
Utilization review completed.  

## 2015-10-04 NOTE — Progress Notes (Signed)
PTT 32. Dr End made aware and wants to continue holding heparin drip for now because of Right groin bleeding from sheath insertion. Will continue to monitor.

## 2015-10-04 NOTE — Progress Notes (Addendum)
ANTICOAGULATION CONSULT NOTE - Follow-up Consult  Pharmacy Consult for Heparin Indication: s/p cath/PCI, IABP in place  No Known Allergies  Patient Measurements:    Heparin Dosing Weight: 75kg  Vital Signs: Temp: 97.6 F (36.4 C) (02/11 2154) Temp Source: Oral (02/11 2154) BP: 119/50 mmHg (02/11 2330) Pulse Rate: 92 (02/11 2330)  Labs:  Recent Labs  10/03/15 1856 10/03/15 2042  HGB 14.8  --   HCT 45.2  --   PLT 170  --   APTT  --  31  LABPROT  --  14.8  INR  --  1.14  CREATININE 1.47*  --   TROPONINI >65.00* >65.00*    CrCl cannot be calculated (Unknown ideal weight.).  Assessment: 82 yo M with STEMI to [facility] where heparin bolus given; gtt not started - sent emergently to cath lab at [facility] where 3V CAD found and PCI performed. Pt with cardiogenic shock requiring IABP placement. To begin heparin while IABP in place.   Goal of Therapy:  Heparin level 0.2-0.5 units/ml Monitor platelets by anticoagulation protocol: Yes   Plan:  Restart heparin gtt at 850 units/hr (~11 units/kg/hr) Will f/u heparin level in 8 hours Daily heparin level and CBC  Sherlon Handing, PharmD, BCPS Clinical pharmacist, pager [phone] 10/04/2015,12:34 AM    ADDENDUM 0630:  Heparin has been off since ~0230 due to patient saturating dressings at groin site. On call MD paged ~0230 by RN and wants to hold heparin. PTT wnl at 0530, pt still saturating dressings - bleeding not worse but not better per RN. On call MD paged again ~0630 and still wants to keep heparin on hold.  Plan: Will d/c heparin gtt from [facility] for now per MD order to hold Will d/c heparin levels F/u bleeding and possible IABP removal.  Sherlon Handing, PharmD, BCPS Clinical pharmacist, pager [phone] 10/04/2015 6:39 AM

## 2015-10-04 NOTE — Progress Notes (Signed)
Orthopedic Tech Progress Note Patient Details:  Chad French Nov 08, 1921 [id number]  Ortho Devices Type of Ortho Device: Knee Immobilizer Ortho Device/Splint Location: rle Ortho Device/Splint Interventions: Ordered Couldn't apply KI to leg due to procedure. Left KI with rn.  Karolee Stamps 10/04/2015, 2:12 AM

## 2015-10-04 NOTE — Progress Notes (Signed)
Patient ID: Chad French, male   DOB: [birth year], 82 y.o.   MRN: [id number]   SUBJECTIVE: No chest pain this morning. Denies dyspnea but he is tachypneic.  Remains on IABP at 1:1.  MAP in 80s by cuff, reading by IABP is inaccurate.  Remains on norepinephrine at 4.   Scheduled Meds: . antiseptic oral rinse  7 mL Mouth Rinse BID  . aspirin EC  81 mg Oral Daily  . atorvastatin  80 mg Oral q1800  . furosemide      . furosemide  40 mg Intravenous BID  . insulin aspart  0-15 Units Subcutaneous TID WC  . potassium chloride  20 mEq Oral Once  . sodium chloride flush  3 mL Intravenous Q12H  . sodium chloride flush  3 mL Intravenous Q12H  . ticagrelor  90 mg Oral BID   Continuous Infusions: . norepinephrine (LEVOPHED) Adult infusion 4 mcg/min (10/04/15 0832)   PRN Meds:.sodium chloride, sodium chloride, acetaminophen, nitroGLYCERIN, ondansetron (ZOFRAN) IV, sodium chloride flush, sodium chloride flush    Filed Vitals:   10/04/15 0600 10/04/15 0700 10/04/15 0737 10/04/15 0800  BP: 107/78 106/71  98/63  Pulse: 99 100    Temp:   99.2 F (37.3 C)   TempSrc:   Axillary   Resp: 35 34    Height:      Weight:      SpO2: 99% 99%      Intake/Output Summary (Last 24 hours) at 10/04/15 0847 Last data filed at 10/04/15 0800  Gross per 24 hour  Intake 391.31 ml  Output   1525 ml  Net -1133.69 ml    LABS: Basic Metabolic Panel:  Recent Labs  10/03/15 1856 10/04/15 0509  NA 144 141  K 4.8 4.5  CL 110 112*  CO2 24 21*  GLUCOSE 159* 163*  BUN 34* 30*  CREATININE 1.47* 1.31*  CALCIUM 9.3 8.6*   Liver Function Tests:  Recent Labs  10/03/15 1856  AST 359*  ALT 76*  ALKPHOS 64  BILITOT 1.0  PROT 6.8  ALBUMIN 3.9   No results for input(s): LIPASE, AMYLASE in the last 72 hours. CBC:  Recent Labs  10/03/15 1856 10/04/15 0539  WBC 16.8* 22.9*  NEUTROABS 13.0*  --   HGB 14.8 14.6  HCT 45.2 44.0  MCV 98.9 99.5  PLT 170 167   Cardiac Enzymes:  Recent Labs  10/03/15 1856 10/03/15 2042 10/04/15 0051  TROPONINI >65.00* >65.00* >65.00*   BNP: Invalid input(s): POCBNP D-Dimer: No results for input(s): DDIMER in the last 72 hours. Hemoglobin A1C: No results for input(s): HGBA1C in the last 72 hours. Fasting Lipid Panel:  Recent Labs  10/04/15 0509  CHOL 151  HDL 39*  LDLCALC 95  TRIG 84  CHOLHDL 3.9   Thyroid Function Tests: No results for input(s): TSH, T4TOTAL, T3FREE, THYROIDAB in the last 72 hours.  Invalid input(s): FREET3 Anemia Panel: No results for input(s): VITAMINB12, FOLATE, FERRITIN, TIBC, IRON, RETICCTPCT in the last 72 hours.  RADIOLOGY: Dg Chest 2 View  10/03/2015  CLINICAL DATA:  Fall today and generalized weakness EXAM: CHEST  2 VIEW COMPARISON:  11/28/2013 FINDINGS: Cardiac shadow is mildly enlarged but stable. Aortic calcifications are again seen. The lungs are well aerated bilaterally. Mild interstitial changes are noted within both lungs. No focal confluent infiltrate is seen. No acute bony abnormality is noted. IMPRESSION: No acute abnormality noted. Electronically Signed   By: Inez Catalina M.D.   On: 10/03/2015 18:59   Portable  Chest X-ray 1 View  10/04/2015  CLINICAL DATA:  Acute onset of shortness of breath. Initial encounter. EXAM: PORTABLE CHEST 1 VIEW COMPARISON:  Chest radiograph performed 10/03/2015 FINDINGS: The lungs are well-aerated. Vascular congestion is noted. Increased interstitial markings raise concern for pulmonary edema. There is no evidence of pleural effusion or pneumothorax. The cardiomediastinal silhouette is mildly enlarged. No acute osseous abnormalities are seen. An intra-aortic balloon pump tip is noted 2 cm below the aortic knob, as expected. IMPRESSION: Vascular congestion and mild cardiomegaly. Increased interstitial markings raise concern for pulmonary edema, new from the prior study. Electronically Signed   By: Garald Balding M.D.   On: 10/04/2015 00:44    PHYSICAL EXAM General:  NAD Neck: JVP 10+ cm, no thyromegaly or thyroid nodule.  Lungs: Mild bibasilar crackles. CV: Heart regular S1/S2, no S3/S4, no murmur.  No peripheral edema.   Abdomen: Soft, nontender, no hepatosplenomegaly, no distention.  Neurologic: Alert and oriented x 3.  Psych: Normal affect. Extremities: No clubbing or cyanosis. Right groin cath site with some oozing.   TELEMETRY: Reviewed telemetry pt in NSR  ASSESSMENT AND PLAN: 82 yo with history of suspected prior silent MI was admitted with lateral STEMI/cardiogenic shock.  He had PCI to LCx/OM and IABP placement.  1. CAD: Lateral STEMI. LHC with culprit 99% LCx lesion, but also chronic occlusion of the LAD and 75% pRCA stenosis.  Now s/p DES to LCx/OM.   - Continue ASA 81 and ticagrelor.  - Atorvastatin 80 daily.  2. Cardiogenic shock: He has IABP in place, MAP 80s consistently by cuff, IABP reading appears inaccurate.  Some oozing at IABP site but hgb/plts stable so far. Remains on norepinephrine at 4. LVEDP 30 on cath, volume overloaded on exam and tachypneic.  - Continue heparin gtt for IABP.  - Wean norepinephrine this morning.   - Lasix 40 mg IV bid, 1st dose now.  - Will need echo.  - If he can be weaned off norepinephrine and breathing improves, will work on weaning IABP next. It may need to remain in until tomorrow, will see how he does through the day.   Discussed with family.   35 minutes critical care time.   Loralie Champagne 10/04/2015 8:56 AM

## 2015-10-04 NOTE — Progress Notes (Signed)
ANTICOAGULATION CONSULT NOTE - Follow-up Consult  Pharmacy Consult for Heparin Indication: s/p cath/PCI, IABP in place  No Known Allergies  Patient Measurements: Height: 5\' 7"  (170.2 cm) Weight: 167 lb 8.8 oz (76 kg) IBW/kg (Calculated) : 66.1  Heparin Dosing Weight: 75kg  Vital Signs: Temp: 98.9 F (37.2 C) (02/12 1608) Temp Source: Oral (02/12 1608) BP: 94/58 mmHg (02/12 1900) Pulse Rate: 89 (02/12 1900)  Labs:  Recent Labs  10/03/15 1856 10/03/15 2042 10/04/15 0051 10/04/15 0509 10/04/15 0539 10/04/15 1152 10/04/15 1753  HGB 14.8  --   --   --  14.6  --   --   HCT 45.2  --   --   --  44.0  --   --   PLT 170  --   --   --  167  --   --   APTT  --  31  --  32  --   --   --   LABPROT  --  14.8  --   --   --   --   --   INR  --  1.14  --   --   --   --   --   HEPARINUNFRC  --   --   --   --   --   --  <0.10*  CREATININE 1.47*  --   --  1.31*  --   --   --   TROPONINI >65.00* >65.00* >65.00* >65.00*  --  >65.00*  --     Estimated Creatinine Clearance: 33.6 mL/min (by C-G formula based on Cr of 1.31).  Assessment: 82 yo M with STEMI to [facility] where heparin bolus given; gtt not started - sent emergently to cath lab at [facility] where 3V CAD found and PCI performed. Pt with cardiogenic shock requiring IABP placement. To begin heparin while IABP in place. Drip started then held for bleeding (saturating dressings). Now to continue heparin per cards MD.  PM: HL <0.1. No further bleeding today or interruptions in infusion today.  Goal of Therapy:  Heparin level 0.2-0.5 units/ml Monitor platelets by anticoagulation protocol: Yes   Plan:  Increase heparin to 1000 units/hr (only increased by 2 uts/kg due to bleeding) Will f/u heparin level in 6 hours Daily heparin level and CBC  Nadir Vasques S. Alford Highland, PharmD, [facility] Clinical Staff Pharmacist Pager [phone]   10/04/2015 7:21 PM

## 2015-10-05 ENCOUNTER — Inpatient Hospital Stay (HOSPITAL_COMMUNITY): Payer: Medicare Other

## 2015-10-05 ENCOUNTER — Other Ambulatory Visit (HOSPITAL_COMMUNITY): Payer: Medicare Other

## 2015-10-05 ENCOUNTER — Encounter (HOSPITAL_COMMUNITY): Payer: Self-pay | Admitting: Cardiology

## 2015-10-05 LAB — MAGNESIUM: MAGNESIUM: 2 mg/dL (ref 1.7–2.4)

## 2015-10-05 LAB — URINE CULTURE

## 2015-10-05 LAB — CBC
HCT: 38.4 % — ABNORMAL LOW (ref 39.0–52.0)
Hemoglobin: 12.1 g/dL — ABNORMAL LOW (ref 13.0–17.0)
MCH: 31.8 pg (ref 26.0–34.0)
MCHC: 31.5 g/dL (ref 30.0–36.0)
MCV: 100.8 fL — ABNORMAL HIGH (ref 78.0–100.0)
PLATELETS: 122 10*3/uL — AB (ref 150–400)
RBC: 3.81 MIL/uL — AB (ref 4.22–5.81)
RDW: 13.8 % (ref 11.5–15.5)
WBC: 14.2 10*3/uL — AB (ref 4.0–10.5)

## 2015-10-05 LAB — BASIC METABOLIC PANEL
ANION GAP: 11 (ref 5–15)
BUN: 34 mg/dL — AB (ref 6–20)
CO2: 23 mmol/L (ref 22–32)
Calcium: 8.5 mg/dL — ABNORMAL LOW (ref 8.9–10.3)
Chloride: 107 mmol/L (ref 101–111)
Creatinine, Ser: 1.46 mg/dL — ABNORMAL HIGH (ref 0.61–1.24)
GFR calc Af Amer: 46 mL/min — ABNORMAL LOW (ref >60)
GFR, EST NON AFRICAN AMERICAN: 40 mL/min — AB (ref >60)
Glucose, Bld: 127 mg/dL — ABNORMAL HIGH (ref 65–99)
POTASSIUM: 4 mmol/L (ref 3.5–5.1)
SODIUM: 141 mmol/L (ref 135–145)

## 2015-10-05 LAB — HEPARIN LEVEL (UNFRACTIONATED)
Heparin Unfractionated: 0.1 IU/mL — ABNORMAL LOW (ref 0.30–0.70)
Heparin Unfractionated: <0.1 IU/mL — ABNORMAL LOW (ref 0.30–0.70)
Heparin Unfractionated: 0.32 IU/mL (ref 0.30–0.70)

## 2015-10-05 LAB — POCT ACTIVATED CLOTTING TIME: ACTIVATED CLOTTING TIME: 435 s

## 2015-10-05 LAB — GLUCOSE, CAPILLARY
Glucose-Capillary: 144 mg/dL — ABNORMAL HIGH (ref 65–99)
Glucose-Capillary: 121 mg/dL — ABNORMAL HIGH (ref 65–99)
Glucose-Capillary: 139 mg/dL — ABNORMAL HIGH (ref 65–99)
Glucose-Capillary: 190 mg/dL — ABNORMAL HIGH (ref 65–99)

## 2015-10-05 LAB — CARBOXYHEMOGLOBIN
CARBOXYHEMOGLOBIN: 1.1 % (ref 0.5–1.5)
METHEMOGLOBIN: 1.2 % (ref 0.0–1.5)
O2 SAT: 72.1 %
TOTAL HEMOGLOBIN: 11.8 g/dL — AB (ref 13.5–18.0)

## 2015-10-05 LAB — HEMOGLOBIN A1C
HEMOGLOBIN A1C: 5.6 % (ref 4.8–5.6)
Mean Plasma Glucose: 114 mg/dL

## 2015-10-05 MED ORDER — MILRINONE IN DEXTROSE 20 MG/100ML IV SOLN
0.1250 ug/kg/min | INTRAVENOUS | Status: DC
  Administered 2015-10-05 – 2015-10-08 (×3): 0.125 ug/kg/min via INTRAVENOUS
  Filled 2015-10-05 (×2): qty 100

## 2015-10-05 MED ORDER — NOREPINEPHRINE BITARTRATE 1 MG/ML IV SOLN
2.0000 ug/min | INTRAVENOUS | Status: DC
  Administered 2015-10-05: 2 ug/min via INTRAVENOUS
  Filled 2015-10-05: qty 16

## 2015-10-05 MED ORDER — HEPARIN (PORCINE) IN NACL 100-0.45 UNIT/ML-% IJ SOLN
1400.0000 [IU]/h | INTRAMUSCULAR | Status: DC
  Administered 2015-10-06: 1400 [IU]/h via INTRAVENOUS
  Filled 2015-10-05: qty 250

## 2015-10-05 MED ORDER — DIGOXIN 125 MCG PO TABS
0.1250 mg | ORAL_TABLET | Freq: Every day | ORAL | Status: DC
  Administered 2015-10-05 – 2015-10-19 (×15): 0.125 mg via ORAL
  Filled 2015-10-05 (×15): qty 1

## 2015-10-05 MED ORDER — SODIUM CHLORIDE 0.9% FLUSH
10.0000 mL | INTRAVENOUS | Status: DC | PRN
  Administered 2015-10-13: 30 mL
  Administered 2015-10-13: 20 mL
  Filled 2015-10-05 (×2): qty 40

## 2015-10-05 MED ORDER — SODIUM CHLORIDE 0.9% FLUSH
10.0000 mL | Freq: Two times a day (BID) | INTRAVENOUS | Status: DC
  Administered 2015-10-05: 10 mL
  Administered 2015-10-06: 20 mL
  Administered 2015-10-06 – 2015-10-15 (×15): 10 mL

## 2015-10-05 MED ORDER — FUROSEMIDE 10 MG/ML IJ SOLN
60.0000 mg | Freq: Two times a day (BID) | INTRAMUSCULAR | Status: DC
  Administered 2015-10-05: 60 mg via INTRAVENOUS
  Filled 2015-10-05: qty 6

## 2015-10-05 NOTE — Progress Notes (Signed)
ANTICOAGULATION CONSULT NOTE - Follow Up Consult  Pharmacy Consult for heparin Indication: IABP  Labs:  Recent Labs  10/03/15 1856 10/03/15 2042 10/04/15 0051 10/04/15 0509 10/04/15 0539 10/04/15 1152 10/04/15 1753 10/05/15 0217  HGB 14.8  --   --   --  14.6  --   --  12.1*  HCT 45.2  --   --   --  44.0  --   --  38.4*  PLT 170  --   --   --  167  --   --  122*  APTT  --  31  --  32  --   --   --   --   LABPROT  --  14.8  --   --   --   --   --   --   INR  --  1.14  --   --   --   --   --   --   HEPARINUNFRC  --   --   --   --   --   --  <0.10* 0.10*  CREATININE 1.47*  --   --  1.31*  --   --   --   --   TROPONINI >65.00* >65.00* >65.00* >65.00*  --  >65.00*  --   --      Assessment: 82yo male subtherapeutic on heparin after rate increase; RN reports no signs of active bleeding, just old drainage.  Goal of Therapy:  Heparin level 0.2-0.5 units/ml   Plan:  Will increase heparin gtt slightly to 1100 units/hr and check level in [place], PharmD, BCPS  10/05/2015,4:04 AM

## 2015-10-05 NOTE — Progress Notes (Signed)
Patient ID: Chad French, male   DOB: 05-11-1922, 82 y.o.   MRN: [id number]  PICC placed, CVP currently 6-7 with co-ox 72%.  Have not been able to wean IABP beyond 1:2 today.  Will start milrinone 0.125 and will use low dose norepinephrine as needed. Hopefully this will allow wean off in then next 24 hrs.    Stop Lasix for now as CVP not elevated.   Loralie Champagne 10/05/2015 3:50 PM

## 2015-10-05 NOTE — Care Management Note (Signed)
Case Management Note  Patient Details  Name: Chad French MRN: [id number] Date of Birth: [birth year]  Subjective/Objective:     Adm w mi               Action/Plan: lives w wife, pcp dr polite   Expected Discharge Date:                  Expected Discharge Plan:     In-House Referral:     Discharge planning Services  CM Consult, Medication Assistance  Post Acute Care Choice:    Choice offered to:     DME Arranged:    DME Agency:     HH Arranged:    [place] Agency:     Status of Service:     Medicare Important Message Given:    Date Medicare IM Given:    Medicare IM give by:    Date Additional Medicare IM Given:    Additional Medicare Important Message give by:     If discussed at [place] of Stay Meetings, dates discussed:    Additional Comments: gave pt 30day free brilinta card  Lacretia Leigh, RN 10/05/2015, 10:16 AM

## 2015-10-05 NOTE — Progress Notes (Signed)
ANTICOAGULATION CONSULT NOTE - Follow Up Consult  Pharmacy Consult for Heparin Indication: IABP  No Known Allergies  Patient Measurements: Height: 5\' 7"  (170.2 cm) Weight: 166 lb 7.2 oz (75.5 kg) IBW/kg (Calculated) : 66.1  Vital Signs: Temp: 98.7 F (37.1 C) (02/13 2000) Temp Source: Oral (02/13 2000) BP: 100/47 mmHg (02/13 2300) Pulse Rate: 45 (02/13 2300)  Labs:  Recent Labs  10/03/15 1856 10/03/15 2042 10/04/15 0051 10/04/15 0509 10/04/15 0539 10/04/15 1152  10/05/15 0217 10/05/15 1211 10/05/15 2250  HGB 14.8  --   --   --  14.6  --   --  12.1*  --   --   HCT 45.2  --   --   --  44.0  --   --  38.4*  --   --   PLT 170  --   --   --  167  --   --  122*  --   --   APTT  --  31  --  32  --   --   --   --   --   --   LABPROT  --  14.8  --   --   --   --   --   --   --   --   INR  --  1.14  --   --   --   --   --   --   --   --   HEPARINUNFRC  --   --   --   --   --   --   < > 0.10* <0.10* 0.32  CREATININE 1.47*  --   --  1.31*  --   --   --  1.46*  --   --   TROPONINI >65.00* >65.00* >65.00* >65.00*  --  >65.00*  --   --   --   --   < > = values in this interval not displayed.  Estimated Creatinine Clearance: 30.2 mL/min (by C-G formula based on Cr of 1.46).   Medications:  Heparin @ 1400 units/hr  Assessment: 89yom s/p STEMI, cardiogenic shock now with IABP in place continues on heparin. Heparin level 0.32 (therapeutic) on 1400 units/. No bleeding reported. IABP now 1:2 - unable to wean further today - started milrinone and low dose Levophed. Hopeful to wean IABP in next 24 hr.    Goal of Therapy:  Heparin level 0.2-0.5 units/ml Monitor platelets by anticoagulation protocol: Yes   Plan:  Continue heparin at 1400 units/hr F/u daily heparin level, CBC  Sherlon Handing, PharmD, BCPS Clinical pharmacist, pager [phone] 10/05/2015,11:30 PM

## 2015-10-05 NOTE — Clinical Documentation Improvement (Signed)
Cardiology  Can the diagnosis of Atrial Fibrillation be further specified?   Chronic Atrial fibrillation  Paroxysmal Atrial fibrillation  Permanent Atrial fibrillation  Persistent Atrial fibrillation  Other  Clinically Undetermined  Document any associated diagnoses/conditions.   Supporting Information: Patient went into atrial fibrillation last night, rate is controlled per 02/13 progress notes.   Please exercise your independent, professional judgment when responding. A specific answer is not anticipated or expected.   Thank You,  [place] [phone]

## 2015-10-05 NOTE — Progress Notes (Signed)
Results of co-ox and CVP reported. Also updated on BP.

## 2015-10-05 NOTE — Progress Notes (Signed)
Converted to NSR, rate, 80's.

## 2015-10-05 NOTE — Progress Notes (Signed)
ANTICOAGULATION CONSULT NOTE - Follow Up Consult  Pharmacy Consult for Heparin Indication: IABP  No Known Allergies  Patient Measurements: Height: 5\' 7"  (170.2 cm) Weight: 166 lb 7.2 oz (75.5 kg) IBW/kg (Calculated) : 66.1  Vital Signs: Temp: 98.9 F (37.2 C) (02/13 0800) Temp Source: Axillary (02/13 0800) BP: 94/55 mmHg (02/13 1430) Pulse Rate: 45 (02/13 1400)  Labs:  Recent Labs  10/03/15 1856 10/03/15 2042 10/04/15 0051 10/04/15 0509 10/04/15 0539 10/04/15 1152 10/04/15 1753 10/05/15 0217 10/05/15 1211  HGB 14.8  --   --   --  14.6  --   --  12.1*  --   HCT 45.2  --   --   --  44.0  --   --  38.4*  --   PLT 170  --   --   --  167  --   --  122*  --   APTT  --  31  --  32  --   --   --   --   --   LABPROT  --  14.8  --   --   --   --   --   --   --   INR  --  1.14  --   --   --   --   --   --   --   HEPARINUNFRC  --   --   --   --   --   --  <0.10* 0.10* <0.10*  CREATININE 1.47*  --   --  1.31*  --   --   --  1.46*  --   TROPONINI >65.00* >65.00* >65.00* >65.00*  --  >65.00*  --   --   --     Estimated Creatinine Clearance: 30.2 mL/min (by C-G formula based on Cr of 1.46).   Medications:  Heparin @ 1100 units/hr  Assessment: 89yom s/p STEMI, cardiogenic shock now with IABP in place continues on heparin. Heparin level is undetectable despite rate increase earlier today. No issues with infusion per RN. IABP is 1:1 with plans to pull soon. No bleeding reported.  Goal of Therapy:  Heparin level 0.2-0.5 units/ml Monitor platelets by anticoagulation protocol: Yes   Plan:  1) Increase heparin to 1400 units/hr 2) Check 8 hour heparin level  Deboraha Sprang 10/05/2015,2:35 PM

## 2015-10-05 NOTE — Progress Notes (Addendum)
Patient ID: Chad French, male   DOB: 04-Jul-1922, 82 y.o.   MRN: [id number]   SUBJECTIVE: No chest pain.  Did not diurese much yesterday, weight down 1 lb.  Still has pulmonary edema on CXR.  Has on facemask because more comfortable than nasal cannula.  Norepinephrine stopped this morning.  Remains on IABP at 1:1.  He went into atrial fibrillation last night, rate is controlled.  Pressure readings from IABP are inaccurate.   Scheduled Meds: . antiseptic oral rinse  7 mL Mouth Rinse BID  . aspirin EC  81 mg Oral Daily  . atorvastatin  80 mg Oral q1800  . digoxin  0.125 mg Oral Daily  . furosemide  60 mg Intravenous BID  . insulin aspart  0-15 Units Subcutaneous TID WC  . sodium chloride flush  3 mL Intravenous Q12H  . sodium chloride flush  3 mL Intravenous Q12H  . ticagrelor  90 mg Oral BID   Continuous Infusions: . heparin 1,100 Units/hr (10/05/15 0408)  . norepinephrine (LEVOPHED) Adult infusion     PRN Meds:.sodium chloride, sodium chloride, acetaminophen, nitroGLYCERIN, ondansetron (ZOFRAN) IV, sodium chloride flush, sodium chloride flush    Filed Vitals:   10/05/15 0400 10/05/15 0500 10/05/15 0600 10/05/15 0700  BP: 95/59 91/43 97/69  98/51  Pulse: 105 97    Temp: 98.9 F (37.2 C)     TempSrc: Oral     Resp: 25 29    Height:      Weight: 166 lb 7.2 oz (75.5 kg)     SpO2: 99% 98%      Intake/Output Summary (Last 24 hours) at 10/05/15 0735 Last data filed at 10/05/15 0700  Gross per 24 hour  Intake 1346.23 ml  Output   1515 ml  Net -168.77 ml    LABS: Basic Metabolic Panel:  Recent Labs  10/04/15 0509 10/05/15 0217  NA 141 141  K 4.5 4.0  CL 112* 107  CO2 21* 23  GLUCOSE 163* 127*  BUN 30* 34*  CREATININE 1.31* 1.46*  CALCIUM 8.6* 8.5*   Liver Function Tests:  Recent Labs  10/03/15 1856  AST 359*  ALT 76*  ALKPHOS 64  BILITOT 1.0  PROT 6.8  ALBUMIN 3.9   No results for input(s): LIPASE, AMYLASE in the last 72 hours. CBC:  Recent  Labs  10/03/15 1856 10/04/15 0539 10/05/15 0217  WBC 16.8* 22.9* 14.2*  NEUTROABS 13.0*  --   --   HGB 14.8 14.6 12.1*  HCT 45.2 44.0 38.4*  MCV 98.9 99.5 100.8*  PLT 170 167 122*   Cardiac Enzymes:  Recent Labs  10/04/15 0051 10/04/15 0509 10/04/15 1152  TROPONINI >65.00* >65.00* >65.00*   BNP: Invalid input(s): POCBNP D-Dimer: No results for input(s): DDIMER in the last 72 hours. Hemoglobin A1C: No results for input(s): HGBA1C in the last 72 hours. Fasting Lipid Panel:  Recent Labs  10/04/15 0509  CHOL 151  HDL 39*  LDLCALC 95  TRIG 84  CHOLHDL 3.9   Thyroid Function Tests: No results for input(s): TSH, T4TOTAL, T3FREE, THYROIDAB in the last 72 hours.  Invalid input(s): FREET3 Anemia Panel: No results for input(s): VITAMINB12, FOLATE, FERRITIN, TIBC, IRON, RETICCTPCT in the last 72 hours.  RADIOLOGY: Dg Chest 2 View  10/03/2015  CLINICAL DATA:  Fall today and generalized weakness EXAM: CHEST  2 VIEW COMPARISON:  11/28/2013 FINDINGS: Cardiac shadow is mildly enlarged but stable. Aortic calcifications are again seen. The lungs are well aerated bilaterally. Mild interstitial changes are  noted within both lungs. No focal confluent infiltrate is seen. No acute bony abnormality is noted. IMPRESSION: No acute abnormality noted. Electronically Signed   By: Inez Catalina M.D.   On: 10/03/2015 18:59   Dg Chest Port 1 View  10/05/2015  CLINICAL DATA:  CHF, STEMI, cardiogenic shock. EXAM: PORTABLE CHEST 1 VIEW COMPARISON:  Portable chest x-ray dated October 04, 2015 FINDINGS: The lungs are well-expanded. The pulmonary interstitial markings are less conspicuous today. The pulmonary vascularity is more distinct and less engorged. The cardiac silhouette remains enlarged. There is no significant pleural effusion. The retrocardiac region on the left is more dense however. There is calcification in the wall of the ascending aorta and aortic arch. IMPRESSION: Improved appearance of  the pulmonary interstitium and pulmonary vascularity consistent with decreased CHF. Developing left lower lobe atelectasis. There is no pleural effusion or pneumothorax. Electronically Signed   By: David  Martinique M.D.   On: 10/05/2015 07:32   Portable Chest X-ray 1 View  10/04/2015  CLINICAL DATA:  Acute onset of shortness of breath. Initial encounter. EXAM: PORTABLE CHEST 1 VIEW COMPARISON:  Chest radiograph performed 10/03/2015 FINDINGS: The lungs are well-aerated. Vascular congestion is noted. Increased interstitial markings raise concern for pulmonary edema. There is no evidence of pleural effusion or pneumothorax. The cardiomediastinal silhouette is mildly enlarged. No acute osseous abnormalities are seen. An intra-aortic balloon pump tip is noted 2 cm below the aortic knob, as expected. IMPRESSION: Vascular congestion and mild cardiomegaly. Increased interstitial markings raise concern for pulmonary edema, new from the prior study. Electronically Signed   By: Garald Balding M.D.   On: 10/04/2015 00:44    PHYSICAL EXAM General: NAD Neck: JVP 10+ cm, no thyromegaly or thyroid nodule.  Lungs: Mild bibasilar crackles. CV: Heart regular S1/S2, no S3/S4, no murmur.  No peripheral edema.   Abdomen: Soft, nontender, no hepatosplenomegaly, no distention.  Neurologic: Alert and oriented x 3.  Psych: Normal affect. Extremities: No clubbing or cyanosis. Right groin cath site stable. Pulses in feet dopplerable.    TELEMETRY: Reviewed telemetry pt in atrial fibrillation in 90s.   ASSESSMENT AND PLAN: 82 yo with history of suspected prior silent MI was admitted with lateral STEMI/cardiogenic shock.  He had PCI to LCx/OM and IABP placement.  1. CAD: Lateral STEMI. LHC with culprit 99% LCx lesion, but also chronic occlusion of the LAD and 75% pRCA stenosis.  Now s/p DES to LCx/OM.   - Continue ASA 81 and ticagrelor.  - Atorvastatin 80 daily.  2. Cardiogenic shock: He has IABP in place at 1:1.   Norepinephrine stopped this morning, not sure if he will tolerate having it off. LVEDP 30 on cath, volume overloaded still on exam with pulmonary edema on CXR.  - Continue heparin gtt for IABP.  - Place PICC to monitor CVP and co-ox. Would like IABP out soon, could use milrinone if needed to wean pump.    - Lasix to 60 mg IV bid.  - digoxin added for inotropy and to help with rate control in atrial fibrillation.  - Still waiting for echo.  3. Heme: Hemoglobin and platelets down some this morning, likely due to IABP.  4. Atrial fibrillation: Patient went into atrial fibrillation with controlled rate last night.  He is on heparin gtt.  Can use amiodarone for rate control if needed but with rate ok currently would hold off.  Given need for antiplatelet agents post procedure and age, would likely avoid long-term anticoagulation.   35 minutes  critical care time.   Loralie Champagne 10/05/2015 7:35 AM

## 2015-10-05 NOTE — Progress Notes (Signed)
Peripherally Inserted Central Catheter/Midline Placement  The IV Nurse has discussed with the patient and/or persons authorized to consent for the patient, the purpose of this procedure and the potential benefits and risks involved with this procedure.  The benefits include less needle sticks, lab draws from the catheter and patient may be discharged home with the catheter.  Risks include, but not limited to, infection, bleeding, blood clot (thrombus formation), and puncture of an artery; nerve damage and irregular heat beat.  Alternatives to this procedure were also discussed.  PICC/Midline Placement Documentation        Chad French 10/05/2015, 8:57 AM

## 2015-10-05 NOTE — Progress Notes (Signed)
Pt in and out of a fib, BP stable, no complications with IABP, Cards fellow aware, no new orders at this time. Pt is resting comfortably. Will continue to monitor closely.

## 2015-10-05 NOTE — Progress Notes (Signed)
Echo has not been completed at this time. Per nurse, PICC line is priority.

## 2015-10-06 ENCOUNTER — Ambulatory Visit (HOSPITAL_COMMUNITY): Payer: Medicare Other

## 2015-10-06 ENCOUNTER — Inpatient Hospital Stay (HOSPITAL_COMMUNITY): Payer: Medicare Other

## 2015-10-06 DIAGNOSIS — I480 Paroxysmal atrial fibrillation: Secondary | ICD-10-CM

## 2015-10-06 DIAGNOSIS — I509 Heart failure, unspecified: Secondary | ICD-10-CM

## 2015-10-06 LAB — BASIC METABOLIC PANEL
ANION GAP: 10 (ref 5–15)
BUN: 35 mg/dL — ABNORMAL HIGH (ref 6–20)
CALCIUM: 8.4 mg/dL — AB (ref 8.9–10.3)
CO2: 23 mmol/L (ref 22–32)
CREATININE: 1.38 mg/dL — AB (ref 0.61–1.24)
Chloride: 106 mmol/L (ref 101–111)
GFR, EST AFRICAN AMERICAN: 50 mL/min — AB (ref >60)
GFR, EST NON AFRICAN AMERICAN: 43 mL/min — AB (ref >60)
Glucose, Bld: 162 mg/dL — ABNORMAL HIGH (ref 65–99)
Potassium: 3.9 mmol/L (ref 3.5–5.1)
SODIUM: 139 mmol/L (ref 135–145)

## 2015-10-06 LAB — GLUCOSE, CAPILLARY
GLUCOSE-CAPILLARY: 152 mg/dL — AB (ref 65–99)
Glucose-Capillary: 151 mg/dL — ABNORMAL HIGH (ref 65–99)
Glucose-Capillary: 167 mg/dL — ABNORMAL HIGH (ref 65–99)
Glucose-Capillary: 119 mg/dL — ABNORMAL HIGH (ref 65–99)

## 2015-10-06 LAB — CBC
HCT: 33.4 % — ABNORMAL LOW (ref 39.0–52.0)
Hemoglobin: 10.5 g/dL — ABNORMAL LOW (ref 13.0–17.0)
MCH: 31.4 pg (ref 26.0–34.0)
MCHC: 31.4 g/dL (ref 30.0–36.0)
MCV: 100 fL (ref 78.0–100.0)
Platelets: 123 K/uL — ABNORMAL LOW (ref 150–400)
RBC: 3.34 MIL/uL — ABNORMAL LOW (ref 4.22–5.81)
RDW: 13.1 % (ref 11.5–15.5)
WBC: 11.2 K/uL — ABNORMAL HIGH (ref 4.0–10.5)

## 2015-10-06 LAB — CARBOXYHEMOGLOBIN
CARBOXYHEMOGLOBIN: 1.1 % (ref 0.5–1.5)
Methemoglobin: 1.1 % (ref 0.0–1.5)
O2 SAT: 77.9 %
TOTAL HEMOGLOBIN: 10.8 g/dL — AB (ref 13.5–18.0)

## 2015-10-06 LAB — HEPARIN LEVEL (UNFRACTIONATED): Heparin Unfractionated: 0.23 IU/mL — ABNORMAL LOW (ref 0.30–0.70)

## 2015-10-06 LAB — POCT ACTIVATED CLOTTING TIME: Activated Clotting Time: 136 seconds

## 2015-10-06 MED ORDER — POTASSIUM CHLORIDE CRYS ER 20 MEQ PO TBCR
20.0000 meq | EXTENDED_RELEASE_TABLET | Freq: Once | ORAL | Status: AC
  Administered 2015-10-06: 20 meq via ORAL
  Filled 2015-10-06: qty 1

## 2015-10-06 MED ORDER — PERFLUTREN LIPID MICROSPHERE
INTRAVENOUS | Status: AC
  Administered 2015-10-06: 3 mL
  Filled 2015-10-06: qty 10

## 2015-10-06 MED ORDER — FUROSEMIDE 10 MG/ML IJ SOLN
40.0000 mg | Freq: Once | INTRAMUSCULAR | Status: AC
  Administered 2015-10-06: 40 mg via INTRAVENOUS

## 2015-10-06 MED ORDER — AMIODARONE HCL 200 MG PO TABS
200.0000 mg | ORAL_TABLET | Freq: Two times a day (BID) | ORAL | Status: DC
  Administered 2015-10-06 – 2015-10-14 (×17): 200 mg via ORAL
  Filled 2015-10-06 (×17): qty 1

## 2015-10-06 MED ORDER — FUROSEMIDE 10 MG/ML IJ SOLN
INTRAMUSCULAR | Status: AC
Start: 2015-10-06 — End: 2015-10-06
  Administered 2015-10-06: 40 mg
  Filled 2015-10-06: qty 4

## 2015-10-06 NOTE — Progress Notes (Signed)
Patient ID: Chad French, male   DOB: 05-10-1922, 82 y.o.   MRN: [id number]   SUBJECTIVE: No chest pain.  BP low yesterday, IABP remains in.  Now on low dose milrinone at 0.125 and norepi at 3.  CVP 10 this morning, co-ox 78%.  IABP is at 1:2 now.    He has gone back into NSR.   Scheduled Meds: . amiodarone  200 mg Oral BID  . antiseptic oral rinse  7 mL Mouth Rinse BID  . aspirin EC  81 mg Oral Daily  . atorvastatin  80 mg Oral q1800  . digoxin  0.125 mg Oral Daily  . furosemide  40 mg Intravenous Once  . insulin aspart  0-15 Units Subcutaneous TID WC  . potassium chloride  20 mEq Oral Once  . sodium chloride flush  10-40 mL Intracatheter Q12H  . sodium chloride flush  3 mL Intravenous Q12H  . sodium chloride flush  3 mL Intravenous Q12H  . ticagrelor  90 mg Oral BID   Continuous Infusions: . heparin 1,400 Units/hr (10/06/15 0358)  . milrinone 0.125 mcg/kg/min (10/05/15 2000)  . norepinephrine (LEVOPHED) Adult infusion 3 mcg/min (10/06/15 0500)   PRN Meds:.sodium chloride, sodium chloride, acetaminophen, nitroGLYCERIN, ondansetron (ZOFRAN) IV, sodium chloride flush, sodium chloride flush, sodium chloride flush    Filed Vitals:   10/06/15 0515 10/06/15 0530 10/06/15 0600 10/06/15 0700  BP: 86/48 102/53 90/51 106/65  Pulse:   42 41  Temp:      TempSrc:      Resp:   26 29  Height:      Weight:      SpO2:   97% 97%    Intake/Output Summary (Last 24 hours) at 10/06/15 0729 Last data filed at 10/06/15 0700  Gross per 24 hour  Intake   1479 ml  Output    795 ml  Net    684 ml    LABS: Basic Metabolic Panel:  Recent Labs  10/05/15 0217 10/06/15 0300  NA 141 139  K 4.0 3.9  CL 107 106  CO2 23 23  GLUCOSE 127* 162*  BUN 34* 35*  CREATININE 1.46* 1.38*  CALCIUM 8.5* 8.4*  MG 2.0  --    Liver Function Tests:  Recent Labs  10/03/15 1856  AST 359*  ALT 76*  ALKPHOS 64  BILITOT 1.0  PROT 6.8  ALBUMIN 3.9   No results for input(s): LIPASE,  AMYLASE in the last 72 hours. CBC:  Recent Labs  10/03/15 1856  10/05/15 0217 10/06/15 0300  WBC 16.8*  < > 14.2* 11.2*  NEUTROABS 13.0*  --   --   --   HGB 14.8  < > 12.1* 10.5*  HCT 45.2  < > 38.4* 33.4*  MCV 98.9  < > 100.8* 100.0  PLT 170  < > 122* 123*  < > = values in this interval not displayed. Cardiac Enzymes:  Recent Labs  10/04/15 0051 10/04/15 0509 10/04/15 1152  TROPONINI >65.00* >65.00* >65.00*   BNP: Invalid input(s): POCBNP D-Dimer: No results for input(s): DDIMER in the last 72 hours. Hemoglobin A1C:  Recent Labs  10/04/15 0051  HGBA1C 5.6   Fasting Lipid Panel:  Recent Labs  10/04/15 0509  CHOL 151  HDL 39*  LDLCALC 95  TRIG 84  CHOLHDL 3.9   Thyroid Function Tests: No results for input(s): TSH, T4TOTAL, T3FREE, THYROIDAB in the last 72 hours.  Invalid input(s): FREET3 Anemia Panel: No results for input(s): VITAMINB12, FOLATE, FERRITIN,  TIBC, IRON, RETICCTPCT in the last 72 hours.  RADIOLOGY: Dg Chest 2 View  10/03/2015  CLINICAL DATA:  Fall today and generalized weakness EXAM: CHEST  2 VIEW COMPARISON:  11/28/2013 FINDINGS: Cardiac shadow is mildly enlarged but stable. Aortic calcifications are again seen. The lungs are well aerated bilaterally. Mild interstitial changes are noted within both lungs. No focal confluent infiltrate is seen. No acute bony abnormality is noted. IMPRESSION: No acute abnormality noted. Electronically Signed   By: Inez Catalina M.D.   On: 10/03/2015 18:59   Dg Chest Port 1 View  10/05/2015  CLINICAL DATA:  New PICC Line EXAM: PORTABLE CHEST 1 VIEW COMPARISON:  10/05/2015 FINDINGS: Interval placement RIGHT PICC line with tip in distal SVC. Stable cardiac silhouette. Mild interstitial/pulmonary edema similar. No pneumothorax. IMPRESSION: 1. RIGHT PICC line in good position. 2. Mild interstitial / pulmonary edema. Electronically Signed   By: Suzy Bouchard M.D.   On: 10/05/2015 10:01   Dg Chest Port 1  View  10/05/2015  CLINICAL DATA:  CHF, STEMI, cardiogenic shock. EXAM: PORTABLE CHEST 1 VIEW COMPARISON:  Portable chest x-ray dated October 04, 2015 FINDINGS: The lungs are well-expanded. The pulmonary interstitial markings are less conspicuous today. The pulmonary vascularity is more distinct and less engorged. The cardiac silhouette remains enlarged. There is no significant pleural effusion. The retrocardiac region on the left is more dense however. There is calcification in the wall of the ascending aorta and aortic arch. IMPRESSION: Improved appearance of the pulmonary interstitium and pulmonary vascularity consistent with decreased CHF. Developing left lower lobe atelectasis. There is no pleural effusion or pneumothorax. Electronically Signed   By: David  Martinique M.D.   On: 10/05/2015 07:32   Portable Chest X-ray 1 View  10/04/2015  CLINICAL DATA:  Acute onset of shortness of breath. Initial encounter. EXAM: PORTABLE CHEST 1 VIEW COMPARISON:  Chest radiograph performed 10/03/2015 FINDINGS: The lungs are well-aerated. Vascular congestion is noted. Increased interstitial markings raise concern for pulmonary edema. There is no evidence of pleural effusion or pneumothorax. The cardiomediastinal silhouette is mildly enlarged. No acute osseous abnormalities are seen. An intra-aortic balloon pump tip is noted 2 cm below the aortic knob, as expected. IMPRESSION: Vascular congestion and mild cardiomegaly. Increased interstitial markings raise concern for pulmonary edema, new from the prior study. Electronically Signed   By: Garald Balding M.D.   On: 10/04/2015 00:44    PHYSICAL EXAM General: NAD Neck: JVP 10 cm, no thyromegaly or thyroid nodule.  Lungs: Mild bibasilar crackles. CV: Heart regular S1/S2, no S3/S4, no murmur.  No peripheral edema.   Abdomen: Soft, nontender, no hepatosplenomegaly, no distention.  Neurologic: Alert and oriented x 3.  Psych: Normal affect. Extremities: No clubbing or  cyanosis. Right groin cath site stable. Pulses in feet dopplerable.    TELEMETRY: Reviewed telemetry pt in NSR 80s   ASSESSMENT AND PLAN: 82 yo with history of suspected prior silent MI was admitted with lateral STEMI/cardiogenic shock.  He had PCI to LCx/OM and IABP placement.  1. CAD: Lateral STEMI. LHC with culprit 99% LCx lesion, but also chronic occlusion of the LAD and 75% pRCA stenosis.  Now s/p DES to LCx/OM.   - Continue ASA 81 and ticagrelor.  - Atorvastatin 80 daily.  2. Cardiogenic shock: He has IABP in place at 1:2 currently. Norepinephrine at 3 and milrinone at 0.125 with co-ox 78% and CVP 10. - Wean IABP to 1:3 this morning.  Will then stop heparin gtt and aim to remove IABP  today when ACT < 150.  - Continue milrinone for now, wean norepinephrine as able.  BP is higher today.     - Lasix 40 mg IV x 1.  - digoxin added for inotropy and to help with rate control in atrial fibrillation.  - Still waiting for echo.  3. Heme: Hemoglobin and platelets down again this morning, likely due to IABP. Plan to remove today and stop heparin. 4. Atrial fibrillation: He is back in NSR this morning.  Will use amiodarone 200 mg bid while on milrinone/norepinephrine to maintain NSR.  Given need for antiplatelet agents post procedure and age, would avoid long-term anticoagulation.  5. CKD: Creatinine stable so far.   35 minutes critical care time.   Loralie Champagne 10/06/2015 7:29 AM

## 2015-10-06 NOTE — Progress Notes (Signed)
Echocardiogram 2D Echocardiogram with Definity has been performed.  Tresa Res 10/06/2015, 3:59 PM

## 2015-10-06 NOTE — Progress Notes (Signed)
Order for sheath removal verified per post procedural orders. Procedure explained to patient and Rt femoral artery access site assessed: level 0, palpable dorsalis pedis present. Balloon Pump Sheath removed and manual pressure applied for 25 minutes. Pre, peri, & post procedural vitals: HR 90, RR 20, O2 Sat upper 90's, BP 100/75, Pain 0. Distal pulses remained intact after sheath removal. Access site level 0 and dressed with 4X4 gauze and tegaderm.  Festus Holts, RN confirmed condition of site. Post procedural instructions discussed with return demonstration from patient.

## 2015-10-06 NOTE — Progress Notes (Signed)
Noted IABP out and will presume pt will be ready for mobilization tomorrow. Pt would benefit from PT consult as he uses a cane PTA and has been in bed for days. Please order if you agree. Yves Dill CES, ACSM 2:53 PM 10/06/2015

## 2015-10-07 DIAGNOSIS — I5023 Acute on chronic systolic (congestive) heart failure: Secondary | ICD-10-CM

## 2015-10-07 LAB — GLUCOSE, CAPILLARY
GLUCOSE-CAPILLARY: 127 mg/dL — AB (ref 65–99)
Glucose-Capillary: 122 mg/dL — ABNORMAL HIGH (ref 65–99)
Glucose-Capillary: 134 mg/dL — ABNORMAL HIGH (ref 65–99)
Glucose-Capillary: 137 mg/dL — ABNORMAL HIGH (ref 65–99)

## 2015-10-07 LAB — CARBOXYHEMOGLOBIN
CARBOXYHEMOGLOBIN: 1.4 % (ref 0.5–1.5)
CARBOXYHEMOGLOBIN: 1.6 % — AB (ref 0.5–1.5)
Methemoglobin: 0.9 % (ref 0.0–1.5)
Methemoglobin: 1 % (ref 0.0–1.5)
O2 SAT: 60 %
O2 Saturation: 59.8 %
TOTAL HEMOGLOBIN: 14.5 g/dL (ref 13.5–18.0)
TOTAL HEMOGLOBIN: 12 g/dL — AB (ref 13.5–18.0)

## 2015-10-07 LAB — BASIC METABOLIC PANEL
ANION GAP: 11 (ref 5–15)
Anion gap: 7 (ref 5–15)
BUN: 25 mg/dL — ABNORMAL HIGH (ref 6–20)
BUN: 19 mg/dL (ref 6–20)
CALCIUM: 8.4 mg/dL — AB (ref 8.9–10.3)
CHLORIDE: 114 mmol/L — AB (ref 101–111)
CO2: 22 mmol/L (ref 22–32)
CO2: 20 mmol/L — AB (ref 22–32)
CREATININE: 1.17 mg/dL (ref 0.61–1.24)
CREATININE: 0.92 mg/dL (ref 0.61–1.24)
Calcium: 6.7 mg/dL — ABNORMAL LOW (ref 8.9–10.3)
Chloride: 103 mmol/L (ref 101–111)
GFR calc Af Amer: >60 mL/min (ref >60)
GFR calc non Af Amer: 52 mL/min — ABNORMAL LOW (ref >60)
GFR calc non Af Amer: >60 mL/min (ref >60)
GLUCOSE: 113 mg/dL — AB (ref 65–99)
Glucose, Bld: 154 mg/dL — ABNORMAL HIGH (ref 65–99)
POTASSIUM: 3.1 mmol/L — AB (ref 3.5–5.1)
Potassium: 4 mmol/L (ref 3.5–5.1)
SODIUM: 136 mmol/L (ref 135–145)
SODIUM: 141 mmol/L (ref 135–145)

## 2015-10-07 LAB — URINALYSIS, ROUTINE W REFLEX MICROSCOPIC
Bilirubin Urine: NEGATIVE
Glucose, UA: NEGATIVE mg/dL
KETONES UR: NEGATIVE mg/dL
LEUKOCYTES UA: NEGATIVE
NITRITE: NEGATIVE
PROTEIN: NEGATIVE mg/dL
Specific Gravity, Urine: 1.007 (ref 1.005–1.030)
pH: 7 (ref 5.0–8.0)

## 2015-10-07 LAB — CBC
HEMATOCRIT: 35.8 % — AB (ref 39.0–52.0)
HEMOGLOBIN: 11.9 g/dL — AB (ref 13.0–17.0)
MCH: 32.3 pg (ref 26.0–34.0)
MCHC: 33.2 g/dL (ref 30.0–36.0)
MCV: 97.3 fL (ref 78.0–100.0)
Platelets: 139 10*3/uL — ABNORMAL LOW (ref 150–400)
RBC: 3.68 MIL/uL — ABNORMAL LOW (ref 4.22–5.81)
RDW: 12.7 % (ref 11.5–15.5)
WBC: 9.9 10*3/uL (ref 4.0–10.5)

## 2015-10-07 LAB — AMMONIA: Ammonia: 19 umol/L (ref 9–35)

## 2015-10-07 LAB — URINE MICROSCOPIC-ADD ON

## 2015-10-07 LAB — LACTIC ACID, PLASMA
LACTIC ACID, VENOUS: 1.7 mmol/L (ref 0.5–2.0)
LACTIC ACID, VENOUS: 1.5 mmol/L (ref 0.5–2.0)

## 2015-10-07 MED ORDER — FUROSEMIDE 10 MG/ML IJ SOLN
40.0000 mg | Freq: Once | INTRAMUSCULAR | Status: AC
  Administered 2015-10-07: 40 mg via INTRAVENOUS
  Filled 2015-10-07: qty 4

## 2015-10-07 MED ORDER — HEPARIN SODIUM (PORCINE) 5000 UNIT/ML IJ SOLN
5000.0000 [IU] | Freq: Three times a day (TID) | INTRAMUSCULAR | Status: DC
  Administered 2015-10-07 – 2015-10-16 (×26): 5000 [IU] via SUBCUTANEOUS
  Filled 2015-10-07 (×27): qty 1

## 2015-10-07 MED ORDER — AMIODARONE IV BOLUS ONLY 150 MG/100ML
150.0000 mg | Freq: Once | INTRAVENOUS | Status: AC
  Administered 2015-10-07: 150 mg via INTRAVENOUS
  Filled 2015-10-07: qty 100

## 2015-10-07 MED ORDER — POTASSIUM CHLORIDE CRYS ER 20 MEQ PO TBCR
40.0000 meq | EXTENDED_RELEASE_TABLET | Freq: Two times a day (BID) | ORAL | Status: AC
  Administered 2015-10-07 (×2): 40 meq via ORAL
  Filled 2015-10-07 (×3): qty 2

## 2015-10-07 NOTE — Progress Notes (Addendum)
Patient ID: Chad French, male   DOB: [birth year], 82 y.o.   MRN: [id number]   SUBJECTIVE: No chest pain, no dyspnea. IABP out 2/14 and norepinephrine off this morning.  SBP stable around 100.  Co-ox 60%, CVP 7-9.    He remains in NSR.  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   Scheduled Meds: . amiodarone  200 mg Oral BID  . antiseptic oral rinse  7 mL Mouth Rinse BID  . aspirin EC  81 mg Oral Daily  . atorvastatin  80 mg Oral q1800  . digoxin  0.125 mg Oral Daily  . furosemide  40 mg Intravenous Once  . insulin aspart  0-15 Units Subcutaneous TID WC  . sodium chloride flush  10-40 mL Intracatheter Q12H  . sodium chloride flush  3 mL Intravenous Q12H  . sodium chloride flush  3 mL Intravenous Q12H  . ticagrelor  90 mg Oral BID   Continuous Infusions: . milrinone 0.125 mcg/kg/min (10/06/15 2133)   PRN Meds:.sodium chloride, sodium chloride, acetaminophen, nitroGLYCERIN, ondansetron (ZOFRAN) IV, sodium chloride flush, sodium chloride flush, sodium chloride flush    Filed Vitals:   10/07/15 0615 10/07/15 0630 10/07/15 0645 10/07/15 0700  BP: 105/84 93/61 100/68 94/63  Pulse: 100 97 95 97  Temp:      TempSrc:      Resp: 38 27 19 35  Height:      Weight:      SpO2: 94% 95% 93% 95%    Intake/Output Summary (Last 24 hours) at 10/07/15 0733 Last data filed at 10/07/15 0700  Gross per 24 hour  Intake 931.62 ml  Output   2850 ml  Net -1918.38 ml    LABS: Basic Metabolic Panel:  Recent Labs  10/05/15 0217 10/06/15 0300 10/07/15 0340  NA 141 139 136  K 4.0 3.9 4.0  CL 107 106 103  CO2 23 23 22   GLUCOSE 127* 162* 154*  BUN 34* 35* 25*  CREATININE 1.46* 1.38* 1.17  CALCIUM 8.5* 8.4* 8.4*  MG 2.0  --   --    Liver Function Tests: No results for input(s): AST, ALT, ALKPHOS, BILITOT, PROT, ALBUMIN in the last 72 hours. No results for input(s): LIPASE, AMYLASE in the last 72  hours. CBC:  Recent Labs  10/06/15 0300 10/07/15 0340  WBC 11.2* 9.9  HGB 10.5* 11.9*  HCT 33.4* 35.8*  MCV 100.0 97.3  PLT 123* 139*   Cardiac Enzymes:  Recent Labs  10/04/15 1152  TROPONINI >65.00*   BNP: Invalid input(s): POCBNP D-Dimer: No results for input(s): DDIMER in the last 72 hours. Hemoglobin A1C: No results for input(s): HGBA1C in the last 72 hours. Fasting Lipid Panel: No results for input(s): CHOL, HDL, LDLCALC, TRIG, CHOLHDL, LDLDIRECT in the last 72 hours. Thyroid Function Tests: No results for input(s): TSH, T4TOTAL, T3FREE, THYROIDAB in the last 72 hours.  Invalid input(s): FREET3 Anemia Panel: No results for input(s): VITAMINB12, FOLATE, FERRITIN, TIBC, IRON, RETICCTPCT in the last 72 hours.  RADIOLOGY: Dg Chest 2 View  10/03/2015  CLINICAL DATA:  Fall today and generalized weakness EXAM: CHEST  2 VIEW COMPARISON:  11/28/2013 FINDINGS: Cardiac shadow is mildly enlarged but stable. Aortic calcifications are again seen. The lungs are well aerated bilaterally. Mild interstitial changes are noted within both lungs. No focal confluent infiltrate is seen. No acute bony abnormality is noted. IMPRESSION: No acute abnormality noted. Electronically Signed   By: Linus Mako.D.  On: 10/03/2015 18:59   Dg Chest Port 1 View  10/06/2015  CLINICAL DATA:  CHF, STEMI, cardiogenic shock. EXAM: PORTABLE CHEST 1 VIEW COMPARISON:  Portable chest x-ray of October 05, 2015 FINDINGS: The lungs are adequately inflated. The pulmonary interstitial markings remain increased. The pulmonary vascularity remains engorged. The cardiac silhouette remains enlarged. There is no pleural effusion or pneumothorax. The right-sided PICC line tip projects over the distal third of the SVC. IMPRESSION: Persistent pulmonary interstitial edema little changed from yesterday's study. Cardiomegaly with pulmonary vascular congestion, stable. Electronically Signed   By: David  Martinique M.D.   On:  10/06/2015 07:27   Dg Chest Port 1 View  10/05/2015  CLINICAL DATA:  New PICC Line EXAM: PORTABLE CHEST 1 VIEW COMPARISON:  10/05/2015 FINDINGS: Interval placement RIGHT PICC line with tip in distal SVC. Stable cardiac silhouette. Mild interstitial/pulmonary edema similar. No pneumothorax. IMPRESSION: 1. RIGHT PICC line in good position. 2. Mild interstitial / pulmonary edema. Electronically Signed   By: Suzy Bouchard M.D.   On: 10/05/2015 10:01   Dg Chest Port 1 View  10/05/2015  CLINICAL DATA:  CHF, STEMI, cardiogenic shock. EXAM: PORTABLE CHEST 1 VIEW COMPARISON:  Portable chest x-ray dated October 04, 2015 FINDINGS: The lungs are well-expanded. The pulmonary interstitial markings are less conspicuous today. The pulmonary vascularity is more distinct and less engorged. The cardiac silhouette remains enlarged. There is no significant pleural effusion. The retrocardiac region on the left is more dense however. There is calcification in the wall of the ascending aorta and aortic arch. IMPRESSION: Improved appearance of the pulmonary interstitium and pulmonary vascularity consistent with decreased CHF. Developing left lower lobe atelectasis. There is no pleural effusion or pneumothorax. Electronically Signed   By: David  Martinique M.D.   On: 10/05/2015 07:32   Portable Chest X-ray 1 View  10/04/2015  CLINICAL DATA:  Acute onset of shortness of breath. Initial encounter. EXAM: PORTABLE CHEST 1 VIEW COMPARISON:  Chest radiograph performed 10/03/2015 FINDINGS: The lungs are well-aerated. Vascular congestion is noted. Increased interstitial markings raise concern for pulmonary edema. There is no evidence of pleural effusion or pneumothorax. The cardiomediastinal silhouette is mildly enlarged. No acute osseous abnormalities are seen. An intra-aortic balloon pump tip is noted 2 cm below the aortic knob, as expected. IMPRESSION: Vascular congestion and mild cardiomegaly. Increased interstitial markings raise  concern for pulmonary edema, new from the prior study. Electronically Signed   By: Garald Balding M.D.   On: 10/04/2015 00:44    PHYSICAL EXAM General: NAD Neck: JVP 8-9 cm, no thyromegaly or thyroid nodule.  Lungs: Mild bibasilar crackles. CV: Heart regular S1/S2, no S3/S4, no murmur.  No peripheral edema.   Abdomen: Soft, nontender, no hepatosplenomegaly, no distention.  Neurologic: Alert and oriented x 3.  Psych: Normal affect. Extremities: No clubbing or cyanosis. Right groin cath site stable.    TELEMETRY: Reviewed telemetry pt in NSR 90s   ASSESSMENT AND PLAN: 82 yo with history of suspected prior silent MI was admitted with lateral STEMI/cardiogenic shock.  He had PCI to LCx/OM and IABP placement.  1. CAD: Lateral STEMI. LHC with culprit 99% LCx lesion, but also chronic occlusion of the LAD and 75% pRCA stenosis.  Now s/p DES to LCx/OM.   - Continue ASA 81 and ticagrelor.  - Atorvastatin 80 daily.  2. Cardiogenic shock: IABP out 2/14 and norepinephrine off this morning.  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  Remains on milrinone 0.125. Co-ox 60%.  CVP 7-9, JVP mildly elevated.   -  Will give Lasix 40 mg IV x 1 with KCl.  - Continue milrinone today, will try to wean off tomorrow. Continue digoxin.  If BP remains stable, add lisinopril at low dose in am.     3. Heme: Hemoglobin/plts better with IABP out. 4. Atrial fibrillation: He had atrial fibrillation earlier in stay. Given need for antiplatelet agents post procedure and age, would avoid long-term anticoagulation. Now on amiodarone to maintain NSR.  5. CKD: Creatinine stable so far.  6. Out of bed today, probably to step-down tomorrow.   Loralie Champagne 10/07/2015 7:33 AM

## 2015-10-07 NOTE — Progress Notes (Signed)
Paged with "increasing confusion", calling out into hallway.   Placed orders for repeat Lactic acid, ammonia, and UA.   Will follow.     Legrand Como [address]" [place], PA-C 10/07/2015 9:10 AM

## 2015-10-07 NOTE — Progress Notes (Signed)
PT Cancellation Note  Patient Details Name: Chad French MRN: [id number] DOB: 03-24-1922   Cancelled Treatment:    Reason Eval/Treat Not Completed: Medical issues which prohibited therapy. Per nursing, hold PT until later today. Pt's vital signs too unstable for activity and pt has increased confusion. Will follow up at a later time.   Thanks,  Colon Branch, SPT Colon Branch 10/07/2015, 9:10 AM

## 2015-10-07 NOTE — Progress Notes (Signed)
Paged physician and PA. Pt is more confused. HR in 110s Sinus. RR 20-40. Awaiting new orders.

## 2015-10-07 NOTE — Progress Notes (Signed)
Pt continues to be confused. Picking at bed and sheets says, "I dropped my credit card". No credit cards to be noted. Barrington Ellison PA paged. Wife called. Family is not able to come sit with patient at this time. Pt spoke to wife on phone. Blue safety matts placed on ground and bed alarm in place.

## 2015-10-07 NOTE — Progress Notes (Signed)
Pt. From ST into Afib with a rate in 120-130s. Paged Dr. Philbert Riser regarding increased agitation, confusion, and rhythm change. Order to be placed for amio bolus. Will continue to monitor closely.

## 2015-10-08 ENCOUNTER — Inpatient Hospital Stay (HOSPITAL_COMMUNITY): Payer: Medicare Other

## 2015-10-08 DIAGNOSIS — R410 Disorientation, unspecified: Secondary | ICD-10-CM

## 2015-10-08 LAB — BASIC METABOLIC PANEL
ANION GAP: 13 (ref 5–15)
ANION GAP: 12 (ref 5–15)
BUN: 29 mg/dL — ABNORMAL HIGH (ref 6–20)
BUN: 29 mg/dL — ABNORMAL HIGH (ref 6–20)
CALCIUM: 8.5 mg/dL — AB (ref 8.9–10.3)
CALCIUM: 8.4 mg/dL — AB (ref 8.9–10.3)
CHLORIDE: 106 mmol/L (ref 101–111)
CO2: 21 mmol/L — AB (ref 22–32)
CO2: 21 mmol/L — AB (ref 22–32)
CREATININE: 1.32 mg/dL — AB (ref 0.61–1.24)
Chloride: 105 mmol/L (ref 101–111)
Creatinine, Ser: 1.35 mg/dL — ABNORMAL HIGH (ref 0.61–1.24)
GFR calc Af Amer: 52 mL/min — ABNORMAL LOW (ref >60)
GFR calc non Af Amer: 45 mL/min — ABNORMAL LOW (ref >60)
GFR, EST AFRICAN AMERICAN: 51 mL/min — AB (ref >60)
GFR, EST NON AFRICAN AMERICAN: 44 mL/min — AB (ref >60)
GLUCOSE: 164 mg/dL — AB (ref 65–99)
Glucose, Bld: 154 mg/dL — ABNORMAL HIGH (ref 65–99)
Potassium: 5.3 mmol/L — ABNORMAL HIGH (ref 3.5–5.1)
Potassium: 5.2 mmol/L — ABNORMAL HIGH (ref 3.5–5.1)
Sodium: 139 mmol/L (ref 135–145)
Sodium: 139 mmol/L (ref 135–145)

## 2015-10-08 LAB — CBC
HEMATOCRIT: 35.6 % — AB (ref 39.0–52.0)
HEMOGLOBIN: 11.6 g/dL — AB (ref 13.0–17.0)
MCH: 31.4 pg (ref 26.0–34.0)
MCHC: 32.6 g/dL (ref 30.0–36.0)
MCV: 96.5 fL (ref 78.0–100.0)
Platelets: 184 10*3/uL (ref 150–400)
RBC: 3.69 MIL/uL — ABNORMAL LOW (ref 4.22–5.81)
RDW: 12.8 % (ref 11.5–15.5)
WBC: 12.3 10*3/uL — AB (ref 4.0–10.5)

## 2015-10-08 LAB — CARBOXYHEMOGLOBIN
Carboxyhemoglobin: 1.4 % (ref 0.5–1.5)
Methemoglobin: 1 % (ref 0.0–1.5)
O2 Saturation: 59.6 %
Total hemoglobin: 13.1 g/dL — ABNORMAL LOW (ref 13.5–18.0)

## 2015-10-08 LAB — GLUCOSE, CAPILLARY
GLUCOSE-CAPILLARY: 153 mg/dL — AB (ref 65–99)
GLUCOSE-CAPILLARY: 134 mg/dL — AB (ref 65–99)
Glucose-Capillary: 150 mg/dL — ABNORMAL HIGH (ref 65–99)

## 2015-10-08 LAB — DIGOXIN LEVEL: Digoxin Level: <0.2 ng/mL — ABNORMAL LOW (ref 0.8–2.0)

## 2015-10-08 MED ORDER — FUROSEMIDE 40 MG PO TABS
40.0000 mg | ORAL_TABLET | Freq: Every day | ORAL | Status: DC
  Administered 2015-10-08: 40 mg via ORAL
  Filled 2015-10-08: qty 1

## 2015-10-08 MED ORDER — HALOPERIDOL LACTATE 5 MG/ML IJ SOLN
0.2500 mg | Freq: Once | INTRAMUSCULAR | Status: AC
  Administered 2015-10-08: 0.25 mg via INTRAVENOUS
  Filled 2015-10-08: qty 1

## 2015-10-08 NOTE — Progress Notes (Signed)
Pt. Remains agitated despite additional haldol and MD made aware. Will continue to monitor patient closely.

## 2015-10-08 NOTE — Progress Notes (Signed)
Paged Dr. Philbert Riser regarding no change or improvement with haldol. Repeat dose ordered. Will continue to monitor closely.

## 2015-10-08 NOTE — Progress Notes (Signed)
Patient ID: Chad French, male   DOB: [birth year], 82 y.o.   MRN: [id number]   SUBJECTIVE: No chest pain, no dyspnea. IABP out 2/14 and norepinephrine off 2/15.  SBP stable around 90-100.  Co-ox 60%, CVP 7-8.    He remains in NSR with brief atrial fibrillation overnight.   Yesterday, he became confused/delirious.  Required Haldol overnight.  He is oriented to person/place this morning but still confused.  Afebrile, WBCs mildly elevated. UA unremarkable.   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   Scheduled Meds: . amiodarone  200 mg Oral BID  . antiseptic oral rinse  7 mL Mouth Rinse BID  . aspirin EC  81 mg Oral Daily  . atorvastatin  80 mg Oral q1800  . digoxin  0.125 mg Oral Daily  . heparin subcutaneous  5,000 Units Subcutaneous 3 times per day  . insulin aspart  0-15 Units Subcutaneous TID WC  . sodium chloride flush  10-40 mL Intracatheter Q12H  . sodium chloride flush  3 mL Intravenous Q12H  . sodium chloride flush  3 mL Intravenous Q12H  . ticagrelor  90 mg Oral BID   Continuous Infusions: . milrinone 0.125 mcg/kg/min (10/07/15 0800)   PRN Meds:.sodium chloride, sodium chloride, acetaminophen, nitroGLYCERIN, ondansetron (ZOFRAN) IV, sodium chloride flush, sodium chloride flush, sodium chloride flush    Filed Vitals:   10/08/15 0300 10/08/15 0400 10/08/15 0500 10/08/15 0645  BP: 110/99 91/68 94/71  101/58  Pulse: 107 102 96 101  Temp:  98.3 F (36.8 C)    TempSrc:  Oral    Resp: 24 23 44 18  Height:      Weight:   164 lb (74.39 kg)   SpO2: 97% 100% 96% 100%    Intake/Output Summary (Last 24 hours) at 10/08/15 0737 Last data filed at 10/08/15 0600  Gross per 24 hour  Intake  852.4 ml  Output   1525 ml  Net -672.6 ml    LABS: Basic Metabolic Panel:  Recent Labs  10/08/15 0337 10/08/15 0436  NA 139 139  K 5.3* 5.2*  CL 105 106  CO2 21* 21*  GLUCOSE 154* 164*  BUN 29* 29*    CREATININE 1.35* 1.32*  CALCIUM 8.5* 8.4*   Liver Function Tests: No results for input(s): AST, ALT, ALKPHOS, BILITOT, PROT, ALBUMIN in the last 72 hours. No results for input(s): LIPASE, AMYLASE in the last 72 hours. CBC:  Recent Labs  10/07/15 0340 10/08/15 0337  WBC 9.9 12.3*  HGB 11.9* 11.6*  HCT 35.8* 35.6*  MCV 97.3 96.5  PLT 139* 184   Cardiac Enzymes: No results for input(s): CKTOTAL, CKMB, CKMBINDEX, TROPONINI in the last 72 hours. BNP: Invalid input(s): POCBNP D-Dimer: No results for input(s): DDIMER in the last 72 hours. Hemoglobin A1C: No results for input(s): HGBA1C in the last 72 hours. Fasting Lipid Panel: No results for input(s): CHOL, HDL, LDLCALC, TRIG, CHOLHDL, LDLDIRECT in the last 72 hours. Thyroid Function Tests: No results for input(s): TSH, T4TOTAL, T3FREE, THYROIDAB in the last 72 hours.  Invalid input(s): FREET3 Anemia Panel: No results for input(s): VITAMINB12, FOLATE, FERRITIN, TIBC, IRON, RETICCTPCT in the last 72 hours.  RADIOLOGY: Dg Chest 2 View  10/03/2015  CLINICAL DATA:  Fall today and generalized weakness EXAM: CHEST  2 VIEW COMPARISON:  11/28/2013 FINDINGS: Cardiac shadow is mildly enlarged but stable. Aortic calcifications are again seen. The lungs are well aerated bilaterally. Mild interstitial changes are noted within  both lungs. No focal confluent infiltrate is seen. No acute bony abnormality is noted. IMPRESSION: No acute abnormality noted. Electronically Signed   By: Inez Catalina M.D.   On: 10/03/2015 18:59   Dg Chest Port 1 View  10/06/2015  CLINICAL DATA:  CHF, STEMI, cardiogenic shock. EXAM: PORTABLE CHEST 1 VIEW COMPARISON:  Portable chest x-ray of October 05, 2015 FINDINGS: The lungs are adequately inflated. The pulmonary interstitial markings remain increased. The pulmonary vascularity remains engorged. The cardiac silhouette remains enlarged. There is no pleural effusion or pneumothorax. The right-sided PICC line tip  projects over the distal third of the SVC. IMPRESSION: Persistent pulmonary interstitial edema little changed from yesterday's study. Cardiomegaly with pulmonary vascular congestion, stable. Electronically Signed   By: David  Martinique M.D.   On: 10/06/2015 07:27   Dg Chest Port 1 View  10/05/2015  CLINICAL DATA:  New PICC Line EXAM: PORTABLE CHEST 1 VIEW COMPARISON:  10/05/2015 FINDINGS: Interval placement RIGHT PICC line with tip in distal SVC. Stable cardiac silhouette. Mild interstitial/pulmonary edema similar. No pneumothorax. IMPRESSION: 1. RIGHT PICC line in good position. 2. Mild interstitial / pulmonary edema. Electronically Signed   By: Suzy Bouchard M.D.   On: 10/05/2015 10:01   Dg Chest Port 1 View  10/05/2015  CLINICAL DATA:  CHF, STEMI, cardiogenic shock. EXAM: PORTABLE CHEST 1 VIEW COMPARISON:  Portable chest x-ray dated October 04, 2015 FINDINGS: The lungs are well-expanded. The pulmonary interstitial markings are less conspicuous today. The pulmonary vascularity is more distinct and less engorged. The cardiac silhouette remains enlarged. There is no significant pleural effusion. The retrocardiac region on the left is more dense however. There is calcification in the wall of the ascending aorta and aortic arch. IMPRESSION: Improved appearance of the pulmonary interstitium and pulmonary vascularity consistent with decreased CHF. Developing left lower lobe atelectasis. There is no pleural effusion or pneumothorax. Electronically Signed   By: David  Martinique M.D.   On: 10/05/2015 07:32   Portable Chest X-ray 1 View  10/04/2015  CLINICAL DATA:  Acute onset of shortness of breath. Initial encounter. EXAM: PORTABLE CHEST 1 VIEW COMPARISON:  Chest radiograph performed 10/03/2015 FINDINGS: The lungs are well-aerated. Vascular congestion is noted. Increased interstitial markings raise concern for pulmonary edema. There is no evidence of pleural effusion or pneumothorax. The cardiomediastinal  silhouette is mildly enlarged. No acute osseous abnormalities are seen. An intra-aortic balloon pump tip is noted 2 cm below the aortic knob, as expected. IMPRESSION: Vascular congestion and mild cardiomegaly. Increased interstitial markings raise concern for pulmonary edema, new from the prior study. Electronically Signed   By: Garald Balding M.D.   On: 10/04/2015 00:44    PHYSICAL EXAM General: NAD Neck: JVP 8 cm, no thyromegaly or thyroid nodule.  Lungs: Mild bibasilar crackles. CV: Heart regular S1/S2, no S3/S4, no murmur.  No peripheral edema.   Abdomen: Soft, nontender, no hepatosplenomegaly, no distention.  Neurologic: Alert and oriented to person/place but some confusion still present.  Psych: Normal affect. Extremities: No clubbing or cyanosis. Right groin cath site stable.    TELEMETRY: Reviewed telemetry pt in NSR 90s   ASSESSMENT AND PLAN: 82 yo with history of suspected prior silent MI was admitted with lateral STEMI/cardiogenic shock.  He had PCI to LCx/OM and IABP placement.  1. CAD: Lateral STEMI. LHC with culprit 99% LCx lesion, but also chronic occlusion of the LAD and 75% pRCA stenosis.  Now s/p DES to LCx/OM.   - Continue ASA 81 and ticagrelor.  -  Atorvastatin 80 daily.  2. Cardiogenic shock: IABP out 2/14 and norepinephrine off 2/15.  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  Remains on milrinone 0.125. Co-ox 60%.  CVP 7-8, JVP mildly elevated.   - Lasix 40 mg po daily.  - Continue milrinone today with co-ox at 60%, will not stop yet given confusion and potential worsening with fall in cardiac output.  - Continue digoxin, check trough level this morning.     3. Heme: Hemoglobin/plts better with IABP out. 4. Atrial fibrillation: He had atrial fibrillation earlier in stay. Given need for antiplatelet agents post procedure and age, would avoid long-term anticoagulation. Now on amiodarone to maintain NSR.  5. CKD: Creatinine stable so far.  6. Delirium: Ongoing.  Somewhat  better today than last night. UA unremarkable, WBCs slightly up.  NH3 and lactate normal yesterday, afebrile.  May be simply from age and ICU hospitalization.  - CXR. - CT head - Would be helpful to have family present to re-direct, will call.  Loralie Champagne 10/08/2015 7:42 AM

## 2015-10-08 NOTE — NC FL2 (Signed)
[place] LEVEL OF CARE SCREENING TOOL     IDENTIFICATION  Patient Name: Chad French Birthdate: 09-Jun-1922 Sex: male Admission Date (Current Location): 10/03/2015  [facility] and [place] Number:  Herbalist and Address:  [facility], [place] [address] [place], [place] [address]      Provider Number: [id number]  Attending Physician Name and Address:  Peter M Martinique, MD  Relative Name and Phone Number:   [name] [phone])    Current Level of Care: Hospital Recommended Level of Care: [place] Prior Approval Number:    Date Approved/Denied:   PASRR Number:  [id number]  Discharge Plan: SNF    Current Diagnoses: Patient Active Problem List   Diagnosis Date Noted  . Cardiogenic shock ([place])   . STEMI (ST elevation myocardial infarction) ([place]) 10/03/2015  . Disequilibrium 07/24/2015  . Old MI (myocardial infarction) 06/12/2014  . Cardiomyopathy, ischemic 06/12/2014  . Essential hypertension 06/12/2014  . Candidal skin infection 12/04/2013  . Generalized weakness 12/04/2013  . CAP (community acquired pneumonia) 11/27/2013  . Febrile illness 11/27/2013  . GI bleed 01/06/2012  . Anemia 01/06/2012  . Diverticulitis   . HTN (hypertension)   . HLD (hyperlipidemia)   . Degenerative disc disease   . Diabetes mellitus ([place])   . Obesity, Class III, BMI 40-49.9 (morbid obesity) ([place])   . Glaucoma     Orientation RESPIRATION BLADDER Height & Weight     Self  O2 (2 liters) Continent Weight: 164 lb (74.39 kg) Height:  5\' 7"  (170.2 cm)  BEHAVIORAL SYMPTOMS/MOOD NEUROLOGICAL BOWEL NUTRITION STATUS      Continent Diet (heart healthy (carb modified))  AMBULATORY STATUS COMMUNICATION OF NEEDS Skin   Limited Assist Verbally    Normal                     Personal Care Assistance Level of Assistance  Bathing, Feeding, Dressing Bathing Assistance: Limited assistance Feeding assistance: Limited  assistance Dressing Assistance: Limited assistance     Functional Limitations Info  Sight, Hearing, Speech Sight Info: Adequate Hearing Info: Adequate Speech Info: Adequate    SPECIAL CARE FACTORS FREQUENCY   PT    min 3xweek                Contractures      Additional Factors Info  Allergies, Code Status, Insulin Sliding Scale Code Status Info: FULL Allergies Info: NKA   Insulin Sliding Scale Info: aspart injection 0-15 units subcutaneous 3x daily with meals       Current Medications (10/08/2015):  This is the current hospital active medication list Current Facility-Administered Medications  Medication Dose Route Frequency Provider Last Rate Last Dose  . 0.9 %  sodium chloride infusion  250 mL Intravenous PRN Peter M Martinique, MD      . 0.9 %  sodium chloride infusion  250 mL Intravenous PRN Peter M Martinique, MD 10 mL/hr at 10/08/15 0800 250 mL at 10/08/15 0800  . acetaminophen (TYLENOL) tablet 650 mg  650 mg Oral Q4H PRN Nelva Bush, MD   650 mg at 10/08/15 0402  . amiodarone (PACERONE) tablet 200 mg  200 mg Oral BID Larey Dresser, MD   200 mg at 10/08/15 1016  . antiseptic oral rinse (CPC / CETYLPYRIDINIUM CHLORIDE 0.05%) solution 7 mL  7 mL Mouth Rinse BID Peter M Martinique, MD   7 mL at 10/08/15 1000  . aspirin EC tablet 81 mg  81  mg Oral Daily Nelva Bush, MD   81 mg at 10/08/15 1016  . atorvastatin (LIPITOR) tablet 80 mg  80 mg Oral q1800 Larey Dresser, MD   80 mg at 10/07/15 1728  . digoxin (LANOXIN) tablet 0.125 mg  0.125 mg Oral Daily Larey Dresser, MD   0.125 mg at 10/08/15 1425  . furosemide (LASIX) tablet 40 mg  40 mg Oral Daily Larey Dresser, MD   40 mg at 10/08/15 1016  . heparin injection 5,000 Units  5,000 Units Subcutaneous 3 times per day Shirley Friar, PA-C   5,000 Units at 10/08/15 1425  . insulin aspart (novoLOG) injection 0-15 Units  0-15 Units Subcutaneous TID WC Nelva Bush, MD   3 Units at 10/08/15 1300  . milrinone  (PRIMACOR) 20 MG/100ML (0.2 mg/mL) infusion  0.125 mcg/kg/min Intravenous Continuous Larey Dresser, MD 2.8 mL/hr at 10/08/15 1431 0.125 mcg/kg/min at 10/08/15 1431  . nitroGLYCERIN (NITROSTAT) SL tablet 0.4 mg  0.4 mg Sublingual Q5 Min x 3 PRN Nelva Bush, MD      . ondansetron (ZOFRAN) injection 4 mg  4 mg Intravenous Q6H PRN Christopher End, MD      . sodium chloride flush (NS) 0.9 % injection 10-40 mL  10-40 mL Intracatheter Q12H Peter M Martinique, MD   10 mL at 10/08/15 1000  . sodium chloride flush (NS) 0.9 % injection 10-40 mL  10-40 mL Intracatheter PRN Peter M Martinique, MD      . sodium chloride flush (NS) 0.9 % injection 3 mL  3 mL Intravenous Q12H Peter M Martinique, MD   3 mL at 10/08/15 1000  . sodium chloride flush (NS) 0.9 % injection 3 mL  3 mL Intravenous PRN Peter M Martinique, MD      . sodium chloride flush (NS) 0.9 % injection 3 mL  3 mL Intravenous Q12H Peter M Martinique, MD   3 mL at 10/08/15 1000  . sodium chloride flush (NS) 0.9 % injection 3 mL  3 mL Intravenous PRN Peter M Martinique, MD      . ticagrelor [facility]) tablet 90 mg  90 mg Oral BID Peter M Martinique, MD   90 mg at 10/08/15 1020     Discharge Medications: Please see discharge summary for a list of discharge medications.  Relevant Imaging Results:  Relevant Lab Results:   Additional Information [id number]  [place] intern  [phone]

## 2015-10-08 NOTE — Evaluation (Signed)
Physical Therapy Evaluation Patient Details Name: Chad French MRN: [id number] DOB: December 02, 1921 Today's Date: 10/08/2015   History of Present Illness  82 yo with history of suspected prior silent MI was admitted with lateral STEMI/cardiogenic shock. He had PCI to LCx/OM and IABP placement.   Clinical Impression  Pt admitted with above diagnosis. Pt currently with functional limitations due to the deficits listed below (see PT Problem List). Pt very weak and unable to stand due to postural limitations with pt leaning to left and flexed.  Pt needed +2 assist to get to chair.  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  Discussed A living with pt and daughter and pt is apprehensive.  Pt does agree to go to SNF for therapy and this PT called SW to give her a heads up to situation.  Will  Follow acutely.   Pt will benefit from skilled PT to increase their independence and safety with mobility to allow discharge to the venue listed below.      Follow Up Recommendations SNF;Supervision/Assistance - 24 hour    Equipment Recommendations  Other (comment) (TBA)    Recommendations for Other Services       Precautions / Restrictions Precautions Precautions: Fall Restrictions Weight Bearing Restrictions: No      Mobility  Bed Mobility Overal bed mobility: Needs Assistance;+2 for physical assistance Bed Mobility: Supine to Sit     Supine to sit: Max assist;+2 for physical assistance     General bed mobility comments: Pt needed assist for LEs and for elevation of trunk.    Transfers Overall transfer level: Needs assistance Equipment used: Rolling walker (2 wheeled);None Transfers: Sit to/from [employer] Sit to Stand: Max assist;+2 physical assistance;From elevated surface   Squat pivot transfers: Max assist;+2 physical assistance;From elevated surface     General transfer comment: Attempted to stand pt x 2 with RW  however pt leaning heavily to left and unable to achieve full upright posture.  Given that pt could not stand with RW to pivot, PT performed squat pivot transfer of 2 people with pt able to weight bear on feet but could not move feet effectively for transfer thus PT moved feet for pt.    Ambulation/Gait                Stairs            Wheelchair Mobility    Modified Rankin (Stroke Patients Only)       Balance Overall balance assessment: Needs assistance;History of Falls Sitting-balance support: Bilateral upper extremity supported;Feet supported Sitting balance-Leahy Scale: Poor Sitting balance - Comments: posterior lean in sitting needing mod to max assist.  Postural control: Posterior lean   Standing balance-Leahy Scale: Zero Standing balance comment: Pt unable to stand statically without +2 max assist.                              Pertinent Vitals/Pain Pain Assessment: Faces Faces Pain Scale: Hurts even more Pain Location: bil LEs as well as neck and back Pain Descriptors / Indicators: Grimacing;Guarding;Sore Pain Intervention(s): Limited activity within patient's tolerance;Monitored during session;Repositioned  VSS    Home Living Family/patient expects to be discharged to:: Private residence Living Arrangements: Spouse/significant other Available Help at Discharge: Family;Available PRN/intermittently Type of Home: House Home Access: Stairs to enter Entrance Stairs-Rails: Left Entrance Stairs-Number of Steps: 1 +3 Home Layout:  Two level;Bed/bath upstairs;1/2 bath on main level Home Equipment: Toilet riser;Walker - 2 wheels Additional Comments: Pt has fallen in kitchen, bedroom etc 4 x in last year.  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     Prior Function Level of Independence: Independent with assistive device(s)         Comments: 2 walkers upstairs and 2 downstairs; daughter drives and shops     Hand Dominance         Extremity/Trunk Assessment   Upper Extremity Assessment: Defer to OT evaluation           Lower Extremity Assessment: Generalized weakness      Cervical / Trunk Assessment: Kyphotic  Communication   Communication: No difficulties  Cognition Arousal/Alertness: Lethargic Behavior During Therapy: Flat affect Overall Cognitive Status: Impaired/Different from baseline Area of Impairment: Following commands;Safety/judgement;Awareness;Problem solving;Attention   Current Attention Level: Focused   Following Commands: Follows one step commands inconsistently;Follows one step commands with increased time Safety/Judgement: Decreased awareness of safety;Decreased awareness of deficits Awareness: Intellectual Problem Solving: Difficulty sequencing;Requires verbal cues;Slow processing;Requires tactile cues;Decreased initiation General Comments: Confusion intermmittent over last three days.      General Comments      Exercises General Exercises - Lower Extremity Ankle Circles/Pumps: AROM;Both;5 reps;Seated Long Arc Quad: AROM;Both;5 reps;Seated Hip Flexion/Marching: AROM;Both;5 reps;Seated      Assessment/Plan    PT Assessment Patient needs continued PT services  PT Diagnosis Generalized weakness;Acute pain   PT Problem List Decreased activity tolerance;Decreased balance;Decreased mobility;Decreased strength;Decreased knowledge of use of DME;Decreased safety awareness;Decreased knowledge of precautions;Pain  PT Treatment Interventions DME instruction;Gait training;Functional mobility training;Therapeutic activities;Therapeutic exercise;Balance training;Patient/family education   PT Goals (Current goals can be found in the Care Plan section) Acute Rehab PT Goals Patient Stated Goal: to go home PT Goal Formulation: With patient Time For Goal Achievement: 10/22/15 Potential to Achieve Goals: Good    Frequency Min 3X/week   Barriers to discharge        Co-evaluation                End of Session Equipment Utilized During Treatment: Gait belt;Oxygen Activity Tolerance: Patient limited by fatigue;Patient limited by pain Patient left: in chair;with call bell/phone within reach;with chair alarm set;with family/visitor present Nurse Communication: Mobility status;Need for lift equipment         Time: [id number] PT Time Calculation (min) (ACUTE ONLY): 37 min   Charges:   PT Evaluation $PT Eval Moderate Complexity: 1 Procedure PT Treatments $Therapeutic Exercise: 8-22 mins   PT G CodesDenice Paradise October 15, 2015, 2:34 PM Pauletta Pickney,PT Acute Rehabilitation [phone] (pager)

## 2015-10-08 NOTE — Progress Notes (Signed)
Paged Dr. Philbert Riser regarding continuing agitation. Pt. Pulling at foley, reaching at lines, and asking to move things in the room that are not present. Pt. Remains oriented to self only. Order to be placed for 0.25 of haldol.

## 2015-10-08 NOTE — Care Management Important Message (Signed)
Important Message  Patient Details  Name: Chad French MRN: [id number] Date of Birth: 01-Jan-1922   Medicare Important Message Given:  Yes    [name] 10/08/2015, 12:02 PM

## 2015-10-09 DIAGNOSIS — T148 Other injury of unspecified body region: Secondary | ICD-10-CM

## 2015-10-09 DIAGNOSIS — L089 Local infection of the skin and subcutaneous tissue, unspecified: Secondary | ICD-10-CM

## 2015-10-09 LAB — CARBOXYHEMOGLOBIN
CARBOXYHEMOGLOBIN: 0.7 % (ref 0.5–1.5)
Carboxyhemoglobin: 1 % (ref 0.5–1.5)
METHEMOGLOBIN: 1.1 % (ref 0.0–1.5)
METHEMOGLOBIN: 0.7 % (ref 0.0–1.5)
O2 SAT: 65.9 %
O2 Saturation: 54.3 %
TOTAL HEMOGLOBIN: 11.5 g/dL — AB (ref 13.5–18.0)
Total hemoglobin: 16.6 g/dL (ref 13.5–18.0)

## 2015-10-09 LAB — URINALYSIS, ROUTINE W REFLEX MICROSCOPIC
Bilirubin Urine: NEGATIVE
GLUCOSE, UA: NEGATIVE mg/dL
KETONES UR: NEGATIVE mg/dL
Nitrite: NEGATIVE
Protein, ur: NEGATIVE mg/dL
SPECIFIC GRAVITY, URINE: 1.018 (ref 1.005–1.030)
pH: 5 (ref 5.0–8.0)

## 2015-10-09 LAB — GLUCOSE, CAPILLARY
GLUCOSE-CAPILLARY: 159 mg/dL — AB (ref 65–99)
GLUCOSE-CAPILLARY: 47 mg/dL — AB (ref 65–99)
GLUCOSE-CAPILLARY: 144 mg/dL — AB (ref 65–99)
GLUCOSE-CAPILLARY: 215 mg/dL — AB (ref 65–99)
Glucose-Capillary: 151 mg/dL — ABNORMAL HIGH (ref 65–99)
Glucose-Capillary: 124 mg/dL — ABNORMAL HIGH (ref 65–99)
Glucose-Capillary: 77 mg/dL (ref 65–99)

## 2015-10-09 LAB — URINE MICROSCOPIC-ADD ON

## 2015-10-09 LAB — CBC
HEMATOCRIT: 35.7 % — AB (ref 39.0–52.0)
HEMOGLOBIN: 12 g/dL — AB (ref 13.0–17.0)
MCH: 32.7 pg (ref 26.0–34.0)
MCHC: 33.6 g/dL (ref 30.0–36.0)
MCV: 97.3 fL (ref 78.0–100.0)
PLATELETS: 186 10*3/uL (ref 150–400)
RBC: 3.67 MIL/uL — AB (ref 4.22–5.81)
RDW: 13.1 % (ref 11.5–15.5)
WBC: 14.6 10*3/uL — ABNORMAL HIGH (ref 4.0–10.5)

## 2015-10-09 LAB — BASIC METABOLIC PANEL
Anion gap: 11 (ref 5–15)
BUN: 33 mg/dL — AB (ref 6–20)
CO2: 22 mmol/L (ref 22–32)
CREATININE: 1.34 mg/dL — AB (ref 0.61–1.24)
Calcium: 8.3 mg/dL — ABNORMAL LOW (ref 8.9–10.3)
Chloride: 107 mmol/L (ref 101–111)
GFR, EST AFRICAN AMERICAN: 51 mL/min — AB (ref >60)
GFR, EST NON AFRICAN AMERICAN: 44 mL/min — AB (ref >60)
Glucose, Bld: 162 mg/dL — ABNORMAL HIGH (ref 65–99)
POTASSIUM: 3.9 mmol/L (ref 3.5–5.1)
SODIUM: 140 mmol/L (ref 135–145)

## 2015-10-09 LAB — C DIFFICILE QUICK SCREEN W PCR REFLEX
C Diff antigen: NEGATIVE
C Diff interpretation: NEGATIVE
C Diff toxin: NEGATIVE

## 2015-10-09 MED ORDER — PIPERACILLIN-TAZOBACTAM 3.375 G IVPB
3.3750 g | Freq: Three times a day (TID) | INTRAVENOUS | Status: DC
  Administered 2015-10-09 – 2015-10-13 (×12): 3.375 g via INTRAVENOUS
  Filled 2015-10-09 (×18): qty 50

## 2015-10-09 MED ORDER — VANCOMYCIN HCL IN DEXTROSE 750-5 MG/150ML-% IV SOLN
750.0000 mg | INTRAVENOUS | Status: DC
  Administered 2015-10-09 – 2015-10-11 (×3): 750 mg via INTRAVENOUS
  Filled 2015-10-09 (×5): qty 150

## 2015-10-09 MED ORDER — LIDOCAINE HCL (PF) 1 % IJ SOLN
INTRAMUSCULAR | Status: AC
  Administered 2015-10-09: 15:00:00 via SUBCUTANEOUS
  Filled 2015-10-09: qty 5

## 2015-10-09 NOTE — Procedures (Signed)
   Patient with purulent drainage and swelling/erythema at previous right femoral IABP insertion site.  The area was prepped sterilely and anesthetized with 1% lidocaine. The wound was then opened with a scalpel and a small amount of purulent material was expressed. We then performed blunt dissection to open the wound and look for abscess tracts. Once the wound was opened sufficiently we then packed it with iodoform gauze. EBL < 10cc. No apparent complications.  Draken Farrior,MD 4:06 PM

## 2015-10-09 NOTE — Progress Notes (Signed)
Da susan adams in from [place]. She had been taking care of parents for about 6 yrs. They want snf. Da agreeable to pt being faxed to [facility] They prefer [facility] then [facility]. They do not want [facility] or [facility]. sw donna to call da susan later today. Da susan home # [phone] and cell [phone].

## 2015-10-09 NOTE — Progress Notes (Signed)
ANTIBIOTIC CONSULT NOTE - INITIAL  Pharmacy Consult for vancomycin and zosyn Indication: sepsis  No Known Allergies  Patient Measurements: Height: 5\' 7"  (170.2 cm) Weight: 161 lb (73.029 kg) IBW/kg (Calculated) : 66.1  Vital Signs: Temp: 101.4 F (38.6 C) (02/17 0400) Temp Source: Oral (02/17 0400) BP: 87/48 mmHg (02/17 0700) Pulse Rate: 99 (02/17 0700) Intake/Output from previous day: 02/16 0701 - 02/17 0700 In: 1270 [P.O.:950; I.V.:320] Out: 1285 [Urine:1285] Intake/Output from this shift:    Labs:  Recent Labs  10/07/15 0340  10/08/15 0337 10/08/15 0436 10/09/15 0435  WBC 9.9  --  12.3*  --  14.6*  HGB 11.9*  --  11.6*  --  12.0*  PLT 139*  --  184  --  186  CREATININE 1.17  < > 1.35* 1.32* 1.34*  < > = values in this interval not displayed. Estimated Creatinine Clearance: 32.9 mL/min (by C-G formula based on Cr of 1.34). No results for input(s): VANCOTROUGH, VANCOPEAK, VANCORANDOM, GENTTROUGH, GENTPEAK, GENTRANDOM, TOBRATROUGH, TOBRAPEAK, TOBRARND, AMIKACINPEAK, AMIKACINTROU, AMIKACIN in the last 72 hours.   Microbiology: Recent Results (from the past 720 hour(s))  Urine culture     Status: None   Collection Time: 10/03/15  8:10 PM  Result Value Ref Range Status   Specimen Description URINE, CLEAN CATCH  Final   Special Requests NONE  Final   Culture   Final    MULTIPLE SPECIES PRESENT, SUGGEST RECOLLECTION Performed at [facility]    Report Status 10/05/2015 FINAL  Final  MRSA PCR Screening     Status: None   Collection Time: 10/04/15 12:18 AM  Result Value Ref Range Status   MRSA by PCR NEGATIVE NEGATIVE Final    Comment:        The GeneXpert MRSA Assay (FDA approved for NASAL specimens only), is one component of a comprehensive MRSA colonization surveillance program. It is not intended to diagnose MRSA infection nor to guide or monitor treatment for MRSA infections.   C difficile quick scan w PCR reflex     Status: None   Collection  Time: 10/08/15 11:41 PM  Result Value Ref Range Status   C Diff antigen NEGATIVE NEGATIVE Final   C Diff toxin NEGATIVE NEGATIVE Final   C Diff interpretation Negative for toxigenic C. difficile  Final    Medical History: Past Medical History  Diagnosis Date  . Diverticulitis     with colonoscopy in 11/2010-Diverticula and polyp whioch was a non-malignant tubuular adenoma  . HTN (hypertension)   . HLD (hyperlipidemia)   . Degenerative disc disease   . Diabetes mellitus ([place])   . Glaucoma    Assessment: 82 year old male s/p cath/IABP earlier this week, IABP removed 2/15, milrinone weaned off today. Patient had fever of 101.4 overnight, wbc continues to trend up to 14.6 and bp is low. New concern for sepsis, has not received any abx this admission.  Confusion a couple of days ago and u/a checked which was negative. Will pan culture and start broad abx for possible sepsis.  Goal of Therapy:  Vancomycin trough level 15-20 mcg/ml  Plan:  Measure antibiotic drug levels at steady state Follow up culture results Start vancomycin 750mg  q24 hours  Zosyn 3.375g IV q8 hours - infuse each dose over 4h  Erin Hearing PharmD., BCPS Clinical Pharmacist Pager [phone] 10/09/2015 10:20 AM

## 2015-10-09 NOTE — Progress Notes (Deleted)
Patient ID: Chad French, male   DOB: [birth year], 82 y.o.   MRN: [id number]   SUBJECTIVE: No chest pain, no dyspnea. IABP out 2/14 and norepinephrine off 2/15.  SBP stable around 90-100.  Co-ox 60%, CVP 7-8.    He remains in NSR with brief atrial fibrillation overnight.   Became confused/delirious starting 10/06/14. Required Haldol overnight.  UA, lactic acid, CT head negative. CXR with improved fluid.   Came around more last night. Had family present which really seemed to help.  Weight shows down 3 lbs. I/O nearly even overnight. Alert and oriented x 3 this morning.  Spiked a fever this morning (0400) at 101.4. WBC up again slightly 14.6. BP soft in 80-90s.  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   Scheduled Meds: . amiodarone  200 mg Oral BID  . antiseptic oral rinse  7 mL Mouth Rinse BID  . aspirin EC  81 mg Oral Daily  . atorvastatin  80 mg Oral q1800  . digoxin  0.125 mg Oral Daily  . furosemide  40 mg Oral Daily  . heparin subcutaneous  5,000 Units Subcutaneous 3 times per day  . insulin aspart  0-15 Units Subcutaneous TID WC  . sodium chloride flush  10-40 mL Intracatheter Q12H  . sodium chloride flush  3 mL Intravenous Q12H  . sodium chloride flush  3 mL Intravenous Q12H  . ticagrelor  90 mg Oral BID   Continuous Infusions: . milrinone 0.125 mcg/kg/min (10/08/15 1431)   PRN Meds:.sodium chloride, sodium chloride, acetaminophen, nitroGLYCERIN, ondansetron (ZOFRAN) IV, sodium chloride flush, sodium chloride flush, sodium chloride flush    Filed Vitals:   10/09/15 0400 10/09/15 0500 10/09/15 0600 10/09/15 0700  BP: 98/59 89/53 93/53  87/48  Pulse: 109 106 105 99  Temp: 101.4 F (38.6 C)     TempSrc: Oral     Resp: 43 40 41 39  Height:      Weight:      SpO2: 96% 98% 99% 100%    Intake/Output Summary (Last 24 hours) at 10/09/15 0713 Last data filed at 10/09/15 0700  Gross per 24 hour  Intake    1270 ml  Output   1285 ml  Net    -15 ml    LABS: Basic Metabolic Panel:  Recent Labs  10/08/15 0436 10/09/15 0435  NA 139 140  K 5.2* 3.9  CL 106 107  CO2 21* 22  GLUCOSE 164* 162*  BUN 29* 33*  CREATININE 1.32* 1.34*  CALCIUM 8.4* 8.3*   Liver Function Tests: No results for input(s): AST, ALT, ALKPHOS, BILITOT, PROT, ALBUMIN in the last 72 hours. No results for input(s): LIPASE, AMYLASE in the last 72 hours. CBC:  Recent Labs  10/08/15 0337 10/09/15 0435  WBC 12.3* 14.6*  HGB 11.6* 12.0*  HCT 35.6* 35.7*  MCV 96.5 97.3  PLT 184 186   Cardiac Enzymes: No results for input(s): CKTOTAL, CKMB, CKMBINDEX, TROPONINI in the last 72 hours. BNP: Invalid input(s): POCBNP D-Dimer: No results for input(s): DDIMER in the last 72 hours. Hemoglobin A1C: No results for input(s): HGBA1C in the last 72 hours. Fasting Lipid Panel: No results for input(s): CHOL, HDL, LDLCALC, TRIG, CHOLHDL, LDLDIRECT in the last 72 hours. Thyroid Function Tests: No results for input(s): TSH, T4TOTAL, T3FREE, THYROIDAB in the last 72 hours.  Invalid input(s): FREET3 Anemia Panel: No results for input(s): VITAMINB12, FOLATE, FERRITIN, TIBC, IRON, RETICCTPCT in the last 72 hours.  RADIOLOGY: Dg Chest 2 View  10/03/2015  CLINICAL DATA:  Fall today and generalized weakness EXAM: CHEST  2 VIEW COMPARISON:  11/28/2013 FINDINGS: Cardiac shadow is mildly enlarged but stable. Aortic calcifications are again seen. The lungs are well aerated bilaterally. Mild interstitial changes are noted within both lungs. No focal confluent infiltrate is seen. No acute bony abnormality is noted. IMPRESSION: No acute abnormality noted. Electronically Signed   By: Inez Catalina M.D.   On: 10/03/2015 18:59   Ct Head Wo Contrast  10/08/2015  CLINICAL DATA:  Confusion.  History of hypertension and diabetes. EXAM: CT HEAD WITHOUT CONTRAST TECHNIQUE: Contiguous axial images were obtained from the base of the skull through the  vertex without intravenous contrast. COMPARISON:  None. FINDINGS: No evidence for acute infarction, hemorrhage, mass lesion, hydrocephalus, or extra-axial fluid. Advanced cerebral and cerebellar atrophy. Extensive hypoattenuation of white matter, likely chronic microvascular ischemic change. Calvarium is intact. There is been previous ocular banding surgery. There is calcification of the skull base internal carotid arteries and distal BILATERAL vertebral arteries. Minor chronic sinus disease. No mastoid fluid. IMPRESSION: Atrophy and small vessel disease.  No acute intracranial findings. Electronically Signed   By: Staci Righter M.D.   On: 10/08/2015 14:59   Dg Chest Port 1 View  10/08/2015  CLINICAL DATA:  Confusion EXAM: PORTABLE CHEST 1 VIEW COMPARISON:  Portable chest x-ray of 10/06/2015 FINDINGS: The previously noted interstitial opacities primarily throughout the right lung have cleared. There is mild basilar volume loss right greater than left. Cardiomegaly is stable. The right PICC line tip overlies the lower SVC. No pneumothorax is seen. IMPRESSION: 1. Marked improvement in interstitial opacities particularly throughout the right lung. 2. Stable cardiomegaly with mild basilar volume loss. Electronically Signed   By: Ivar Drape M.D.   On: 10/08/2015 08:15   Dg Chest Port 1 View  10/06/2015  CLINICAL DATA:  CHF, STEMI, cardiogenic shock. EXAM: PORTABLE CHEST 1 VIEW COMPARISON:  Portable chest x-ray of October 05, 2015 FINDINGS: The lungs are adequately inflated. The pulmonary interstitial markings remain increased. The pulmonary vascularity remains engorged. The cardiac silhouette remains enlarged. There is no pleural effusion or pneumothorax. The right-sided PICC line tip projects over the distal third of the SVC. IMPRESSION: Persistent pulmonary interstitial edema little changed from yesterday's study. Cardiomegaly with pulmonary vascular congestion, stable. Electronically Signed   By: David  Martinique  M.D.   On: 10/06/2015 07:27   Dg Chest Port 1 View  10/05/2015  CLINICAL DATA:  New PICC Line EXAM: PORTABLE CHEST 1 VIEW COMPARISON:  10/05/2015 FINDINGS: Interval placement RIGHT PICC line with tip in distal SVC. Stable cardiac silhouette. Mild interstitial/pulmonary edema similar. No pneumothorax. IMPRESSION: 1. RIGHT PICC line in good position. 2. Mild interstitial / pulmonary edema. Electronically Signed   By: Suzy Bouchard M.D.   On: 10/05/2015 10:01   Dg Chest Port 1 View  10/05/2015  CLINICAL DATA:  CHF, STEMI, cardiogenic shock. EXAM: PORTABLE CHEST 1 VIEW COMPARISON:  Portable chest x-ray dated October 04, 2015 FINDINGS: The lungs are well-expanded. The pulmonary interstitial markings are less conspicuous today. The pulmonary vascularity is more distinct and less engorged. The cardiac silhouette remains enlarged. There is no significant pleural effusion. The retrocardiac region on the left is more dense however. There is calcification in the wall of the ascending aorta and aortic arch. IMPRESSION: Improved appearance of the pulmonary interstitium and pulmonary vascularity consistent with decreased CHF. Developing left lower lobe atelectasis. There is no pleural effusion or  pneumothorax. Electronically Signed   By: David  Martinique M.D.   On: 10/05/2015 07:32   Portable Chest X-ray 1 View  10/04/2015  CLINICAL DATA:  Acute onset of shortness of breath. Initial encounter. EXAM: PORTABLE CHEST 1 VIEW COMPARISON:  Chest radiograph performed 10/03/2015 FINDINGS: The lungs are well-aerated. Vascular congestion is noted. Increased interstitial markings raise concern for pulmonary edema. There is no evidence of pleural effusion or pneumothorax. The cardiomediastinal silhouette is mildly enlarged. No acute osseous abnormalities are seen. An intra-aortic balloon pump tip is noted 2 cm below the aortic knob, as expected. IMPRESSION: Vascular congestion and mild cardiomegaly. Increased interstitial markings  raise concern for pulmonary edema, new from the prior study. Electronically Signed   By: Garald Balding M.D.   On: 10/04/2015 00:44    PHYSICAL EXAM General: NAD Neck: JVP 5-6 cm, no thyromegaly or thyroid nodule.  Lungs: very slight basilar crackles, otherwise clear.  CV: Heart regular S1/S2, no S3/S4, no murmur.  No peripheral edema.   Abdomen: Soft, NT, ND, no HSM. No bruits or masses. +BS  Neurologic: Alert and oriented this morning.  Psych: Normal affect. Extremities: No clubbing or cyanosis. Right groin cath site stable.  Moving all extremities without difficult.   TELEMETRY: Reviewed personally, NSR/ST 100s.   ASSESSMENT AND PLAN: 82 yo with history of suspected prior silent MI was admitted with lateral STEMI/cardiogenic shock.  He had PCI to LCx/OM and IABP placement.  1. CAD: Lateral STEMI. LHC with culprit 99% LCx lesion, but also chronic occlusion of the LAD and 75% pRCA stenosis.  Now s/p DES to LCx/OM.   - Continue ASA 81 and ticagrelor.  - Atorvastatin 80 daily.  2. Cardiogenic shock: IABP out 2/14 and norepinephrine off 2/15.  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  Remains on milrinone 0.125. Co-ox 65.9%.  - CVP 3-4.    - Continue Lasix 40 mg po daily.  - Continue milrinone. Co-ox at 65.9% - Continue digoxin, level <0.2 10/08/15  3. Heme:  - Hemoglobin/plts stable.  4. Atrial fibrillation: He had atrial fibrillation earlier in stay. Given need for antiplatelet agents post procedure and age, would avoid long-term anticoagulation.  - Had short recurrence overnight 2/15-2/16. Converted with re-bolus of amio.  - Maintaining NSR currently.  5. CKD stage II-III - Stable. Follow closely with diuresis.   6. Delirium:  - Resolved. Thought to be from age and ICU hospitalization.  - UA initially unremarkable, WBCs slightly up.  NH3 and lactate normal 10/07/15, afebrile.   - CXR negative, CT head negative.  7. ID - Spiked a fever overnight. WBC slightly up - Will recheck UA and  get Blood Cx x 2.   Shirley Friar, PA-C 10/09/2015 7:13 AM   Advanced Heart Failure Team Pager [phone] (M-F; 7a - 4p)  Please contact [facility] for night-coverage after hours (4p -7a ) and weekends on [email or web address]

## 2015-10-09 NOTE — Clinical Documentation Improvement (Signed)
Cardiology  Can the diagnosis of CKD be further specified?   CKD Stage I - GFR greater than or equal to 90  CKD Stage II - GFR 60-89  CKD Stage III - GFR 30-59  CKD Stage IV - GFR 15-29  CKD Stage V - GFR < 15  ESRD (End Stage Renal Disease)  Other condition  Unable to clinically determine   Supporting Information: : (risk factors, signs and symptoms, diagnostics, treatment) CKD- creatinine stable so far per 02/14 progress notes.   Please exercise your independent, professional judgment when responding. A specific answer is not anticipated or expected.   Thank You, [place] [phone]

## 2015-10-09 NOTE — Progress Notes (Signed)
Physical Therapy Treatment Patient Details Name: Chad French MRN: [id number] DOB: 03/31/1922 Today's Date: 10/09/2015    History of Present Illness 82 yo with history of suspected prior silent MI was admitted with lateral STEMI/cardiogenic shock. He had PCI to LCx/OM and IABP placement.     PT Comments    Patient demonstrates some improvements and progress towards goals today. Patient appears to have some improved cognition this session as well and confirmed by family present at bedside. Patient was able to initiate movement for OOB activity, tolerated increased time at EOB and required less assist during transfers this session. Will continue to see and progress as tolerated.   Follow Up Recommendations  SNF;Supervision/Assistance - 24 hour     Equipment Recommendations  Other (comment) (TBA)    Recommendations for Other Services       Precautions / Restrictions Precautions Precautions: Fall Restrictions Weight Bearing Restrictions: No    Mobility  Bed Mobility Overal bed mobility: Needs Assistance;+2 for physical assistance Bed Mobility: Supine to Sit     Supine to sit: Max assist;+2 for physical assistance     General bed mobility comments: patient able to initiate LE movement to EOB but had some pain in bilateral LEs, increased assist to come around to EOB using chuck pad and helicopter technique.   Transfers Overall transfer level: Needs assistance Equipment used: Rolling walker (2 wheeled);None Transfers: Sit to/from Stand Sit to Stand: Max assist;+2 physical assistance;From elevated surface   Squat pivot transfers: Mod assist     General transfer comment: wrap around support with gait belt and chuck pad  Ambulation/Gait                 Stairs            Wheelchair Mobility    Modified Rankin (Stroke Patients Only)       Balance Overall balance assessment: Needs assistance Sitting-balance support: Bilateral upper extremity  supported;Feet supported Sitting balance-Leahy Scale: Fair Sitting balance - Comments: able to sit EOB >5 minutes with intermittent cues to lean forward Postural control: Posterior lean Standing balance support: During functional activity Standing balance-Leahy Scale: Poor Standing balance comment: required bilateral UE support                    Cognition Arousal/Alertness: Awake/alert Behavior During Therapy: Flat affect Overall Cognitive Status: Impaired/Different from baseline Area of Impairment: Following commands;Safety/judgement;Awareness;Problem solving;Attention   Current Attention Level: Focused   Following Commands: Follows one step commands inconsistently;Follows one step commands with increased time Safety/Judgement: Decreased awareness of safety;Decreased awareness of deficits Awareness: Intellectual Problem Solving: Difficulty sequencing;Requires verbal cues;Slow processing;Requires tactile cues;Decreased initiation General Comments: improved overall cognition this session compared to previous session.    Exercises General Exercises - Lower Extremity Ankle Circles/Pumps: AROM;Both;5 reps;Seated    General Comments General comments (skin integrity, edema, etc.): dynamic sitting EOB with with trunk control and weight shifts      Pertinent Vitals/Pain Pain Assessment: Faces Faces Pain Scale: Hurts little more Pain Location: LEs R>L Pain Descriptors / Indicators: Aching;Guarding;Sore Pain Intervention(s): Limited activity within patient's tolerance;Monitored during session;Repositioned    Home Living                      Prior Function            PT Goals (current goals can now be found in the care plan section) Acute Rehab PT Goals Patient Stated Goal: to go home PT Goal Formulation: With  patient Time For Goal Achievement: 10/22/15 Potential to Achieve Goals: Good Progress towards PT goals: Progressing toward goals    Frequency  Min  3X/week    PT Plan Current plan remains appropriate    Co-evaluation             End of Session Equipment Utilized During Treatment: Gait belt;Oxygen Activity Tolerance: Patient limited by fatigue;Patient limited by pain Patient left: in chair;with call bell/phone within reach;with chair alarm set;with family/visitor present     Time: [id number] PT Time Calculation (min) (ACUTE ONLY): 21 min  Charges:  $Therapeutic Exercise: 8-22 mins                    G CodesDuncan Dull October 14, 2015, 11:21 AM Alben Deeds, PT DPT  [phone]

## 2015-10-09 NOTE — Progress Notes (Addendum)
Patient ID: Chad French, male   DOB: 1922/03/01, 82 y.o.   MRN: [id number]   SUBJECTIVE: No chest pain, no dyspnea. IABP out 2/14 and norepinephrine off 2/15.  Co-ox 66%, CVP 3-4.  Very weak with getting out of bed yesterday.   He remains in NSR.   More clear this morning.  However, febrile to 101.6 with SBP running lower in the 80s.    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   Scheduled Meds: . amiodarone  200 mg Oral BID  . antiseptic oral rinse  7 mL Mouth Rinse BID  . aspirin EC  81 mg Oral Daily  . atorvastatin  80 mg Oral q1800  . digoxin  0.125 mg Oral Daily  . heparin subcutaneous  5,000 Units Subcutaneous 3 times per day  . insulin aspart  0-15 Units Subcutaneous TID WC  . sodium chloride flush  10-40 mL Intracatheter Q12H  . sodium chloride flush  3 mL Intravenous Q12H  . sodium chloride flush  3 mL Intravenous Q12H  . ticagrelor  90 mg Oral BID   Continuous Infusions:   PRN Meds:.sodium chloride, sodium chloride, acetaminophen, nitroGLYCERIN, ondansetron (ZOFRAN) IV, sodium chloride flush, sodium chloride flush, sodium chloride flush    Filed Vitals:   10/09/15 0400 10/09/15 0500 10/09/15 0600 10/09/15 0700  BP: 98/59 89/53 93/53  87/48  Pulse: 109 106 105 99  Temp: 101.4 F (38.6 C)     TempSrc: Oral     Resp: 43 40 41 39  Height:      Weight:      SpO2: 96% 98% 99% 100%    Intake/Output Summary (Last 24 hours) at 10/09/15 0742 Last data filed at 10/09/15 0700  Gross per 24 hour  Intake   1270 ml  Output   1285 ml  Net    -15 ml    LABS: Basic Metabolic Panel:  Recent Labs  10/08/15 0436 10/09/15 0435  NA 139 140  K 5.2* 3.9  CL 106 107  CO2 21* 22  GLUCOSE 164* 162*  BUN 29* 33*  CREATININE 1.32* 1.34*  CALCIUM 8.4* 8.3*   Liver Function Tests: No results for input(s): AST, ALT, ALKPHOS, BILITOT, PROT, ALBUMIN in the last 72 hours. No results for input(s): LIPASE,  AMYLASE in the last 72 hours. CBC:  Recent Labs  10/08/15 0337 10/09/15 0435  WBC 12.3* 14.6*  HGB 11.6* 12.0*  HCT 35.6* 35.7*  MCV 96.5 97.3  PLT 184 186   Cardiac Enzymes: No results for input(s): CKTOTAL, CKMB, CKMBINDEX, TROPONINI in the last 72 hours. BNP: Invalid input(s): POCBNP D-Dimer: No results for input(s): DDIMER in the last 72 hours. Hemoglobin A1C: No results for input(s): HGBA1C in the last 72 hours. Fasting Lipid Panel: No results for input(s): CHOL, HDL, LDLCALC, TRIG, CHOLHDL, LDLDIRECT in the last 72 hours. Thyroid Function Tests: No results for input(s): TSH, T4TOTAL, T3FREE, THYROIDAB in the last 72 hours.  Invalid input(s): FREET3 Anemia Panel: No results for input(s): VITAMINB12, FOLATE, FERRITIN, TIBC, IRON, RETICCTPCT in the last 72 hours.  RADIOLOGY: Dg Chest 2 View  10/03/2015  CLINICAL DATA:  Fall today and generalized weakness EXAM: CHEST  2 VIEW COMPARISON:  11/28/2013 FINDINGS: Cardiac shadow is mildly enlarged but stable. Aortic calcifications are again seen. The lungs are well aerated bilaterally. Mild interstitial changes are noted within both lungs. No focal confluent infiltrate is seen. No acute bony abnormality is noted. IMPRESSION: No acute  abnormality noted. Electronically Signed   By: Inez Catalina M.D.   On: 10/03/2015 18:59   Ct Head Wo Contrast  10/08/2015  CLINICAL DATA:  Confusion.  History of hypertension and diabetes. EXAM: CT HEAD WITHOUT CONTRAST TECHNIQUE: Contiguous axial images were obtained from the base of the skull through the vertex without intravenous contrast. COMPARISON:  None. FINDINGS: No evidence for acute infarction, hemorrhage, mass lesion, hydrocephalus, or extra-axial fluid. Advanced cerebral and cerebellar atrophy. Extensive hypoattenuation of white matter, likely chronic microvascular ischemic change. Calvarium is intact. There is been previous ocular banding surgery. There is calcification of the skull base  internal carotid arteries and distal BILATERAL vertebral arteries. Minor chronic sinus disease. No mastoid fluid. IMPRESSION: Atrophy and small vessel disease.  No acute intracranial findings. Electronically Signed   By: Staci Righter M.D.   On: 10/08/2015 14:59   Dg Chest Port 1 View  10/08/2015  CLINICAL DATA:  Confusion EXAM: PORTABLE CHEST 1 VIEW COMPARISON:  Portable chest x-ray of 10/06/2015 FINDINGS: The previously noted interstitial opacities primarily throughout the right lung have cleared. There is mild basilar volume loss right greater than left. Cardiomegaly is stable. The right PICC line tip overlies the lower SVC. No pneumothorax is seen. IMPRESSION: 1. Marked improvement in interstitial opacities particularly throughout the right lung. 2. Stable cardiomegaly with mild basilar volume loss. Electronically Signed   By: Ivar Drape M.D.   On: 10/08/2015 08:15   Dg Chest Port 1 View  10/06/2015  CLINICAL DATA:  CHF, STEMI, cardiogenic shock. EXAM: PORTABLE CHEST 1 VIEW COMPARISON:  Portable chest x-ray of October 05, 2015 FINDINGS: The lungs are adequately inflated. The pulmonary interstitial markings remain increased. The pulmonary vascularity remains engorged. The cardiac silhouette remains enlarged. There is no pleural effusion or pneumothorax. The right-sided PICC line tip projects over the distal third of the SVC. IMPRESSION: Persistent pulmonary interstitial edema little changed from yesterday's study. Cardiomegaly with pulmonary vascular congestion, stable. Electronically Signed   By: David  Martinique M.D.   On: 10/06/2015 07:27   Dg Chest Port 1 View  10/05/2015  CLINICAL DATA:  New PICC Line EXAM: PORTABLE CHEST 1 VIEW COMPARISON:  10/05/2015 FINDINGS: Interval placement RIGHT PICC line with tip in distal SVC. Stable cardiac silhouette. Mild interstitial/pulmonary edema similar. No pneumothorax. IMPRESSION: 1. RIGHT PICC line in good position. 2. Mild interstitial / pulmonary edema.  Electronically Signed   By: Suzy Bouchard M.D.   On: 10/05/2015 10:01   Dg Chest Port 1 View  10/05/2015  CLINICAL DATA:  CHF, STEMI, cardiogenic shock. EXAM: PORTABLE CHEST 1 VIEW COMPARISON:  Portable chest x-ray dated October 04, 2015 FINDINGS: The lungs are well-expanded. The pulmonary interstitial markings are less conspicuous today. The pulmonary vascularity is more distinct and less engorged. The cardiac silhouette remains enlarged. There is no significant pleural effusion. The retrocardiac region on the left is more dense however. There is calcification in the wall of the ascending aorta and aortic arch. IMPRESSION: Improved appearance of the pulmonary interstitium and pulmonary vascularity consistent with decreased CHF. Developing left lower lobe atelectasis. There is no pleural effusion or pneumothorax. Electronically Signed   By: David  Martinique M.D.   On: 10/05/2015 07:32   Portable Chest X-ray 1 View  10/04/2015  CLINICAL DATA:  Acute onset of shortness of breath. Initial encounter. EXAM: PORTABLE CHEST 1 VIEW COMPARISON:  Chest radiograph performed 10/03/2015 FINDINGS: The lungs are well-aerated. Vascular congestion is noted. Increased interstitial markings raise concern for pulmonary edema. There is  no evidence of pleural effusion or pneumothorax. The cardiomediastinal silhouette is mildly enlarged. No acute osseous abnormalities are seen. An intra-aortic balloon pump tip is noted 2 cm below the aortic knob, as expected. IMPRESSION: Vascular congestion and mild cardiomegaly. Increased interstitial markings raise concern for pulmonary edema, new from the prior study. Electronically Signed   By: Garald Balding M.D.   On: 10/04/2015 00:44    PHYSICAL EXAM General: NAD Neck: JVP 7 cm, no thyromegaly or thyroid nodule.  Lungs: Mild bibasilar crackles. CV: Heart regular S1/S2, no S3/S4, no murmur.  No peripheral edema.   Abdomen: Soft, nontender, no hepatosplenomegaly, no distention.    Neurologic: Alert and oriented to person/place, no confusion this morning Psych: Normal affect. Extremities: No clubbing or cyanosis.    TELEMETRY: Reviewed telemetry pt in NSR 90s   ASSESSMENT AND PLAN: 81 yo with history of suspected prior silent MI was admitted with lateral STEMI/cardiogenic shock.  He had PCI to LCx/OM and IABP placement.  1. CAD: Lateral STEMI. LHC with culprit 99% LCx lesion, but also chronic occlusion of the LAD and 75% pRCA stenosis.  Now s/p DES to LCx/OM.   - Continue ASA 81 and ticagrelor.  - Atorvastatin 80 daily.  2. Acute systolic CHF/cardiogenic shock: IABP out 2/14 and norepinephrine off 2/15.  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  CVP 3-4, co-ox 66%.  BP lower in setting of fever this morning.  - Can stop milrinone with good co-ox but continue digoxin.  - Hold Lasix with low CVP.   - If BP falls further, may need norepinephrine => with fever, concerned for septic shock component.    3. Heme: Hemoglobin/plts better with IABP out. 4. Atrial fibrillation: He had atrial fibrillation earlier in stay. Given need for antiplatelet agents post procedure and age, would avoid long-term anticoagulation. Now on amiodarone to maintain NSR.  5. CKD: Creatinine stable so far.  6. Delirium: This seems to be resolved.  7. ID: Febrile overnight, WBCs up to 14.  BP soft in 80s.  CXR yesterday did not show PNA. - Send UA, urine culture, blood culture.  - Given concern for developing sepsis, will go ahead and begin empiric coverage with vancomycin/Zosyn. 8. He will need SNF at discharge.    Loralie Champagne 10/09/2015 7:42 AM   Call from nurse, right groin cath site red with some oozing.  Think this is likely source of fever.  Will evaluate. He is on antibiotics already.   Loralie Champagne 10/09/2015 3:00 PM

## 2015-10-10 DIAGNOSIS — R5381 Other malaise: Secondary | ICD-10-CM

## 2015-10-10 DIAGNOSIS — I509 Heart failure, unspecified: Secondary | ICD-10-CM | POA: Insufficient documentation

## 2015-10-10 DIAGNOSIS — I480 Paroxysmal atrial fibrillation: Secondary | ICD-10-CM

## 2015-10-10 DIAGNOSIS — N390 Urinary tract infection, site not specified: Secondary | ICD-10-CM

## 2015-10-10 DIAGNOSIS — L02214 Cutaneous abscess of groin: Secondary | ICD-10-CM

## 2015-10-10 LAB — BASIC METABOLIC PANEL
Anion gap: 10 (ref 5–15)
BUN: 35 mg/dL — AB (ref 6–20)
CALCIUM: 8.5 mg/dL — AB (ref 8.9–10.3)
CO2: 22 mmol/L (ref 22–32)
CREATININE: 1.41 mg/dL — AB (ref 0.61–1.24)
Chloride: 108 mmol/L (ref 101–111)
GFR calc Af Amer: 48 mL/min — ABNORMAL LOW (ref >60)
GFR, EST NON AFRICAN AMERICAN: 42 mL/min — AB (ref >60)
GLUCOSE: 175 mg/dL — AB (ref 65–99)
Potassium: 3.6 mmol/L (ref 3.5–5.1)
Sodium: 140 mmol/L (ref 135–145)

## 2015-10-10 LAB — GLUCOSE, CAPILLARY
GLUCOSE-CAPILLARY: 456 mg/dL — AB (ref 65–99)
GLUCOSE-CAPILLARY: 176 mg/dL — AB (ref 65–99)
GLUCOSE-CAPILLARY: 164 mg/dL — AB (ref 65–99)
Glucose-Capillary: 140 mg/dL — ABNORMAL HIGH (ref 65–99)

## 2015-10-10 LAB — CARBOXYHEMOGLOBIN
Carboxyhemoglobin: 1 % (ref 0.5–1.5)
Methemoglobin: 1 % (ref 0.0–1.5)
O2 Saturation: 59 %
Total hemoglobin: 10.8 g/dL — ABNORMAL LOW (ref 13.5–18.0)

## 2015-10-10 LAB — CBC
HCT: 34.2 % — ABNORMAL LOW (ref 39.0–52.0)
Hemoglobin: 11 g/dL — ABNORMAL LOW (ref 13.0–17.0)
MCH: 31.4 pg (ref 26.0–34.0)
MCHC: 32.2 g/dL (ref 30.0–36.0)
MCV: 97.7 fL (ref 78.0–100.0)
PLATELETS: 240 10*3/uL (ref 150–400)
RBC: 3.5 MIL/uL — ABNORMAL LOW (ref 4.22–5.81)
RDW: 13.3 % (ref 11.5–15.5)
WBC: 14.9 10*3/uL — ABNORMAL HIGH (ref 4.0–10.5)

## 2015-10-10 LAB — URINE CULTURE

## 2015-10-10 MED ORDER — SODIUM CHLORIDE 0.9 % IV SOLN: INTRAVENOUS | Status: DC

## 2015-10-10 NOTE — NC FL2 (Signed)
[place] LEVEL OF CARE SCREENING TOOL     IDENTIFICATION  Patient Name: Chad French Birthdate: May 10, 1922 Sex: male Admission Date (Current Location): 10/03/2015  [facility] and [place] Number:  Herbalist and Address:  [facility], [place] [address], [place], [place] [address]      Provider Number: [id number]  Attending Physician Name and Address:  Peter M Martinique, MD  Relative Name and Phone Number:  [name] (daughter)  [phone]    Current Level of Care: Hospital Recommended Level of Care: [place] Prior Approval Number:    Date Approved/Denied:   PASRR Number:   [id number]  Discharge Plan: SNF    Current Diagnoses: Patient Active Problem List   Diagnosis Date Noted  . Abscess of right groin 10/10/2015  . UTI (urinary tract infection) 10/10/2015  . PAF (paroxysmal atrial fibrillation) ([place]) 10/10/2015  . Physical deconditioning 10/10/2015  . CHF (congestive heart failure) ([place])   . Cardiogenic shock ([place])   . STEMI (ST elevation myocardial infarction) ([place]) 10/03/2015  . Disequilibrium 07/24/2015  . Old MI (myocardial infarction) 06/12/2014  . Cardiomyopathy, ischemic 06/12/2014  . Essential hypertension 06/12/2014  . Candidal skin infection 12/04/2013  . Generalized weakness 12/04/2013  . CAP (community acquired pneumonia) 11/27/2013  . Febrile illness 11/27/2013  . GI bleed 01/06/2012  . Anemia 01/06/2012  . Diverticulitis   . HTN (hypertension)   . HLD (hyperlipidemia)   . Degenerative disc disease   . Diabetes mellitus ([place])   . Obesity, Class III, BMI 40-49.9 (morbid obesity) ([place])   . Glaucoma     Orientation RESPIRATION BLADDER Height & Weight     Self, Time, Situation, Place  O2 (2L Continous) Incontinent Weight: 163 lb (73.936 kg) Height:  5\' 7"  (170.2 cm)  BEHAVIORAL SYMPTOMS/MOOD NEUROLOGICAL BOWEL NUTRITION STATUS  Other (Comment) (very calm and cooperative. No  behaviors observed or concern)   Incontinent, Continent Diet (heart healthy)  AMBULATORY STATUS COMMUNICATION OF NEEDS Skin   Extensive Assist Verbally Surgical wounds, Skin abrasions                       Personal Care Assistance Level of Assistance  Bathing, Feeding, Dressing Bathing Assistance: Limited assistance Feeding assistance: Independent Dressing Assistance: Limited assistance     Functional Limitations Info  Hearing Sight Info: Adequate (wears glasses)) Hearing Info: Impaired Speech Info: Adequate    SPECIAL CARE FACTORS FREQUENCY  PT (By licensed PT), OT (By licensed OT)     PT Frequency: 3x a week OT Frequency: 3x a week            Contractures Contractures Info: Not present    Additional Factors Info  Code Status, Allergies, Insulin Sliding Scale, Isolation Precautions, Psychotropic Code Status Info: Full  Allergies Info: NKA Psychotropic Info: none  Insulin Sliding Scale Info: 3x a day with meals Isolation Precautions Info: Enteric percautions     Current Medications (10/10/2015):  This is the current hospital active medication list Current Facility-Administered Medications  Medication Dose Route Frequency Provider Last Rate Last Dose  . 0.9 %  sodium chloride infusion  250 mL Intravenous PRN Peter M Martinique, MD      . 0.9 %  sodium chloride infusion  250 mL Intravenous PRN Peter M Martinique, MD   Stopped at 10/09/15 0800  . acetaminophen (TYLENOL) tablet 650 mg  650 mg Oral Q4H PRN Nelva Bush, MD   650 mg at 10/10/15 1215  .  amiodarone (PACERONE) tablet 200 mg  200 mg Oral BID Larey Dresser, MD   200 mg at 10/10/15 1211  . antiseptic oral rinse (CPC / CETYLPYRIDINIUM CHLORIDE 0.05%) solution 7 mL  7 mL Mouth Rinse BID Peter M Martinique, MD   7 mL at 10/10/15 1000  . aspirin EC tablet 81 mg  81 mg Oral Daily Christopher End, MD   81 mg at 10/10/15 1210  . atorvastatin (LIPITOR) tablet 80 mg  80 mg Oral q1800 Larey Dresser, MD   80 mg at 10/09/15  1904  . digoxin (LANOXIN) tablet 0.125 mg  0.125 mg Oral Daily Larey Dresser, MD   0.125 mg at 10/10/15 1211  . heparin injection 5,000 Units  5,000 Units Subcutaneous 3 times per day Shirley Friar, PA-C   5,000 Units at 10/10/15 [phone]  . insulin aspart (novoLOG) injection 0-15 Units  0-15 Units Subcutaneous TID WC Nelva Bush, MD   5 Units at 10/09/15 1732  . nitroGLYCERIN (NITROSTAT) SL tablet 0.4 mg  0.4 mg Sublingual Q5 Min x 3 PRN Nelva Bush, MD      . ondansetron (ZOFRAN) injection 4 mg  4 mg Intravenous Q6H PRN Christopher End, MD      . piperacillin-tazobactam (ZOSYN) IVPB 3.375 g  3.375 g Intravenous 3 times per day Lyndee Leo, RPH   3.375 g at 10/10/15 0513  . sodium chloride flush (NS) 0.9 % injection 10-40 mL  10-40 mL Intracatheter Q12H Peter M Martinique, MD   10 mL at 10/10/15 1000  . sodium chloride flush (NS) 0.9 % injection 10-40 mL  10-40 mL Intracatheter PRN Peter M Martinique, MD      . sodium chloride flush (NS) 0.9 % injection 3 mL  3 mL Intravenous Q12H Peter M Martinique, MD   3 mL at 10/10/15 1000  . sodium chloride flush (NS) 0.9 % injection 3 mL  3 mL Intravenous PRN Peter M Martinique, MD      . sodium chloride flush (NS) 0.9 % injection 3 mL  3 mL Intravenous Q12H Peter M Martinique, MD   3 mL at 10/10/15 1000  . sodium chloride flush (NS) 0.9 % injection 3 mL  3 mL Intravenous PRN Peter M Martinique, MD   3 mL at 10/09/15 1120  . ticagrelor (BRILINTA) tablet 90 mg  90 mg Oral BID Peter M Martinique, MD   90 mg at 10/10/15 1210  . vancomycin (VANCOCIN) IVPB 750 mg/150 ml premix  750 mg Intravenous Q24H Lyndee Leo, RPH   750 mg at 10/10/15 1210     Discharge Medications: Please see discharge summary for a list of discharge medications.  Relevant Imaging Results:  Relevant Lab Results:   Additional Information SS#  [id number]  Lilly [place], [place]

## 2015-10-10 NOTE — Clinical Social Work Note (Signed)
Clinical Social Work Assessment  Patient Details  Name: Chad French MRN: [id number] Date of Birth: [birth year]  Date of referral:  10/10/15               Reason for consult:  Facility Placement, Discharge Planning                Permission sought to share information with:  Case Manager, Facility Sport and exercise psychologist, Family Supports Permission granted to share information::  Yes, Verbal Permission Granted  Name::        Agency::  SNF Search for [facility]  Relationship::  Chad French [phone]  Contact Information:     Housing/Transportation Living arrangements for the past 2 months:  [place] of Information:  Patient, Medical Team, Friend/Neighbor, Adult Children Patient Interpreter Needed:  None Criminal Activity/Legal Involvement Pertinent to Current Situation/Hospitalization:  No - Comment as needed Significant Relationships:  Adult Children, Other Family Members, Friend Lives with:  Spouse Do you feel safe going back to the place where you live?  No (due to medical condition and needs, requiring SNF at DC) Need for family participation in patient care:  Yes (Comment) (pt request and family)  Care giving concerns:  Patient and adult children report patient is too weak for him to return home and needs continued care.  Agreeable that pt wife at home is also unable to manage patient. Agreeable to SNF placement when medically stable at [place] assessment / plan:  LCSW has spoken with patient and family friend at bedside. Obtained permission and explained role in hospital. Patient aware of situation and reports to call his daughter Chad French or Chad French. Contact made with Chad French who was very pleasant and appreciative of work and call.  Agreeable to SNF when medically ready in [facility].  LCSW explained process and workup.  Family requesting bed offers and any additional information to support patient. LCSW completed assessment, passar, FL2 and  faxed pt out. SW for weekday will follow up with family and patient with bed offers.   Employment status:  Retired Forensic scientist:  [employer] PT Recommendations:  Research officer, trade union, [place] / Referral to community resources:  [place]  Patient/Family's Response to care:  Agreeable to plan   Patient/Family's Understanding of and Emotional Response to Diagnosis, Current Treatment, and Prognosis:  Family very realistic and agreeable to SNF recommendation and SW intervention. Aware of new infection patient as acquired and aggressive treatment.  Aware pt wife needs involvement, but until plan at DC is solidified all contact will be with adult children who communicate daily with pt wife.    Emotional Assessment Appearance:  Appears stated age Attitude/Demeanor/Rapport:  Other (pleasant and cooperative) Affect (typically observed):  Accepting, Hopeful, Pleasant Orientation:  Oriented to Self, Oriented to Place, Oriented to Situation Alcohol / Substance use:  Not Applicable Psych involvement (Current and /or in the community):  No (Comment)  Discharge Needs  Concerns to be addressed:  Denies Needs/Concerns at this time Readmission within the last 30 days:  No Current discharge risk:  None Barriers to Discharge:  No Barriers Identified, Continued Medical Work up   [place], LCSW 10/10/2015, 12:16 PM

## 2015-10-10 NOTE — Clinical Social Work Placement (Signed)
   CLINICAL SOCIAL WORK PLACEMENT  NOTE  Date:  10/10/2015  Patient Details  Name: Chad French MRN: [id number] Date of Birth: 1922-06-07  Clinical Social Work is seeking post-discharge placement for this patient at the [place] level of care (*CSW will initial, date and re-position this form in  chart as items are completed):  Yes   Patient/family provided with [facility] Work Department's list of facilities offering this level of care within the geographic area requested by the patient (or if unable, by the patient's family).  Yes   Patient/family informed of their freedom to choose among providers that offer the needed level of care, that participate in Medicare, Medicaid or managed care program needed by the patient, have an available bed and are willing to accept the patient.  Yes   Patient/family informed of [facility]'s ownership interest in [facility] and [facility], as well as of the fact that they are under no obligation to receive care at these facilities.  PASRR submitted to EDS on 10/10/15     PASRR number received on 10/10/15     Existing PASRR number confirmed on 10/10/15     FL2 transmitted to all facilities in geographic area requested by pt/family on 10/10/15     FL2 transmitted to all facilities within larger geographic area on       Patient informed that his/her managed care company has contracts with or will negotiate with certain facilities, including the following:            Patient/family informed of bed offers received.  Patient chooses bed at       Physician recommends and patient chooses bed at      Patient to be transferred to   on  .  Patient to be transferred to facility by       Patient family notified on   of transfer.  Name of family member notified:        PHYSICIAN Please sign FL2     Additional Comment:    _______________________________________________ Lilly C[place],  LCSW 10/10/2015, 12:21 PM

## 2015-10-10 NOTE — Progress Notes (Signed)
Patient ID: Chad French, male   DOB: [birth year], 82 y.o.   MRN: [id number]   SUBJECTIVE: No chest pain, no dyspnea. IABP out 2/14 and norepinephrine off 2/15. Milrinone stopped 2/17.   Yesterday concern for impending sepsis with fever to 101.6. UA +  R groin abscess I&D. Vanc & zosyn started   Bcx, UCX and wound cx pending   Co-ox 59%, CVP 3-4. Much more alert this am. Denies SOB. SBP 95-100. Afebrile He remains in NSR.     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   Scheduled Meds: . amiodarone  200 mg Oral BID  . antiseptic oral rinse  7 mL Mouth Rinse BID  . aspirin EC  81 mg Oral Daily  . atorvastatin  80 mg Oral q1800  . digoxin  0.125 mg Oral Daily  . heparin subcutaneous  5,000 Units Subcutaneous 3 times per day  . insulin aspart  0-15 Units Subcutaneous TID WC  . piperacillin-tazobactam (ZOSYN)  IV  3.375 g Intravenous 3 times per day  . sodium chloride flush  10-40 mL Intracatheter Q12H  . sodium chloride flush  3 mL Intravenous Q12H  . sodium chloride flush  3 mL Intravenous Q12H  . ticagrelor  90 mg Oral BID  . vancomycin  750 mg Intravenous Q24H   Continuous Infusions:   PRN Meds:.sodium chloride, sodium chloride, acetaminophen, nitroGLYCERIN, ondansetron (ZOFRAN) IV, sodium chloride flush, sodium chloride flush, sodium chloride flush    Filed Vitals:   10/10/15 0400 10/10/15 0500 10/10/15 0600 10/10/15 0800  BP: 100/59  105/64 88/58  Pulse: 89  85 87  Temp:      TempSrc:      Resp: 26  21 20   Height:      Weight:  73.936 kg (163 lb)    SpO2: 100%  100% 99%    Intake/Output Summary (Last 24 hours) at 10/10/15 0916 Last data filed at 10/10/15 0513  Gross per 24 hour  Intake    820 ml  Output    100 ml  Net    720 ml    LABS: Basic Metabolic Panel:  Recent Labs  10/09/15 0435 10/10/15 0520  NA 140 140  K 3.9 3.6  CL 107 108  CO2 22 22  GLUCOSE 162* 175*  BUN 33* 35*    CREATININE 1.34* 1.41*  CALCIUM 8.3* 8.5*   Liver Function Tests: No results for input(s): AST, ALT, ALKPHOS, BILITOT, PROT, ALBUMIN in the last 72 hours. No results for input(s): LIPASE, AMYLASE in the last 72 hours. CBC:  Recent Labs  10/09/15 0435 10/10/15 0521  WBC 14.6* 14.9*  HGB 12.0* 11.0*  HCT 35.7* 34.2*  MCV 97.3 97.7  PLT 186 240   Cardiac Enzymes: No results for input(s): CKTOTAL, CKMB, CKMBINDEX, TROPONINI in the last 72 hours. BNP: Invalid input(s): POCBNP D-Dimer: No results for input(s): DDIMER in the last 72 hours. Hemoglobin A1C: No results for input(s): HGBA1C in the last 72 hours. Fasting Lipid Panel: No results for input(s): CHOL, HDL, LDLCALC, TRIG, CHOLHDL, LDLDIRECT in the last 72 hours. Thyroid Function Tests: No results for input(s): TSH, T4TOTAL, T3FREE, THYROIDAB in the last 72 hours.  Invalid input(s): FREET3 Anemia Panel: No results for input(s): VITAMINB12, FOLATE, FERRITIN, TIBC, IRON, RETICCTPCT in the last 72 hours.  RADIOLOGY: Dg Chest 2 View  10/03/2015  CLINICAL DATA:  Fall today and generalized weakness EXAM: CHEST  2 VIEW COMPARISON:  11/28/2013 FINDINGS:  Cardiac shadow is mildly enlarged but stable. Aortic calcifications are again seen. The lungs are well aerated bilaterally. Mild interstitial changes are noted within both lungs. No focal confluent infiltrate is seen. No acute bony abnormality is noted. IMPRESSION: No acute abnormality noted. Electronically Signed   By: Inez Catalina M.D.   On: 10/03/2015 18:59   Ct Head Wo Contrast  10/08/2015  CLINICAL DATA:  Confusion.  History of hypertension and diabetes. EXAM: CT HEAD WITHOUT CONTRAST TECHNIQUE: Contiguous axial images were obtained from the base of the skull through the vertex without intravenous contrast. COMPARISON:  None. FINDINGS: No evidence for acute infarction, hemorrhage, mass lesion, hydrocephalus, or extra-axial fluid. Advanced cerebral and cerebellar atrophy.  Extensive hypoattenuation of white matter, likely chronic microvascular ischemic change. Calvarium is intact. There is been previous ocular banding surgery. There is calcification of the skull base internal carotid arteries and distal BILATERAL vertebral arteries. Minor chronic sinus disease. No mastoid fluid. IMPRESSION: Atrophy and small vessel disease.  No acute intracranial findings. Electronically Signed   By: Staci Righter M.D.   On: 10/08/2015 14:59   Dg Chest Port 1 View  10/08/2015  CLINICAL DATA:  Confusion EXAM: PORTABLE CHEST 1 VIEW COMPARISON:  Portable chest x-ray of 10/06/2015 FINDINGS: The previously noted interstitial opacities primarily throughout the right lung have cleared. There is mild basilar volume loss right greater than left. Cardiomegaly is stable. The right PICC line tip overlies the lower SVC. No pneumothorax is seen. IMPRESSION: 1. Marked improvement in interstitial opacities particularly throughout the right lung. 2. Stable cardiomegaly with mild basilar volume loss. Electronically Signed   By: Ivar Drape M.D.   On: 10/08/2015 08:15   Dg Chest Port 1 View  10/06/2015  CLINICAL DATA:  CHF, STEMI, cardiogenic shock. EXAM: PORTABLE CHEST 1 VIEW COMPARISON:  Portable chest x-ray of October 05, 2015 FINDINGS: The lungs are adequately inflated. The pulmonary interstitial markings remain increased. The pulmonary vascularity remains engorged. The cardiac silhouette remains enlarged. There is no pleural effusion or pneumothorax. The right-sided PICC line tip projects over the distal third of the SVC. IMPRESSION: Persistent pulmonary interstitial edema little changed from yesterday's study. Cardiomegaly with pulmonary vascular congestion, stable. Electronically Signed   By: David  Martinique M.D.   On: 10/06/2015 07:27   Dg Chest Port 1 View  10/05/2015  CLINICAL DATA:  New PICC Line EXAM: PORTABLE CHEST 1 VIEW COMPARISON:  10/05/2015 FINDINGS: Interval placement RIGHT PICC line with tip  in distal SVC. Stable cardiac silhouette. Mild interstitial/pulmonary edema similar. No pneumothorax. IMPRESSION: 1. RIGHT PICC line in good position. 2. Mild interstitial / pulmonary edema. Electronically Signed   By: Suzy Bouchard M.D.   On: 10/05/2015 10:01   Dg Chest Port 1 View  10/05/2015  CLINICAL DATA:  CHF, STEMI, cardiogenic shock. EXAM: PORTABLE CHEST 1 VIEW COMPARISON:  Portable chest x-ray dated October 04, 2015 FINDINGS: The lungs are well-expanded. The pulmonary interstitial markings are less conspicuous today. The pulmonary vascularity is more distinct and less engorged. The cardiac silhouette remains enlarged. There is no significant pleural effusion. The retrocardiac region on the left is more dense however. There is calcification in the wall of the ascending aorta and aortic arch. IMPRESSION: Improved appearance of the pulmonary interstitium and pulmonary vascularity consistent with decreased CHF. Developing left lower lobe atelectasis. There is no pleural effusion or pneumothorax. Electronically Signed   By: David  Martinique M.D.   On: 10/05/2015 07:32   Portable Chest X-ray 1 View  10/04/2015  CLINICAL DATA:  Acute onset of shortness of breath. Initial encounter. EXAM: PORTABLE CHEST 1 VIEW COMPARISON:  Chest radiograph performed 10/03/2015 FINDINGS: The lungs are well-aerated. Vascular congestion is noted. Increased interstitial markings raise concern for pulmonary edema. There is no evidence of pleural effusion or pneumothorax. The cardiomediastinal silhouette is mildly enlarged. No acute osseous abnormalities are seen. An intra-aortic balloon pump tip is noted 2 cm below the aortic knob, as expected. IMPRESSION: Vascular congestion and mild cardiomegaly. Increased interstitial markings raise concern for pulmonary edema, new from the prior study. Electronically Signed   By: Garald Balding M.D.   On: 10/04/2015 00:44    PHYSICAL EXAM General: NAD Neck: JVP flat, no thyromegaly or  thyroid nodule.  Lungs:clear CV: Heart regular S1/S2, no S3/S4, no murmur.  No peripheral edema.   Abdomen: Soft, nontender, no hepatosplenomegaly, no distention.  Neurologic: Alert and oriented to person/place, no confusion this morning Psych: Normal affect. Extremities: No clubbing or cyanosis.  R groin site with improving cellulitis. Site is packed  TELEMETRY: Reviewed telemetry pt in NSR 90s   ASSESSMENT AND PLAN: 82 yo with history of suspected prior silent MI was admitted with lateral STEMI/cardiogenic shock.  He had PCI to LCx/OM and IABP placement.  1. CAD: Lateral STEMI. LHC with culprit 99% LCx lesion, but also chronic occlusion of the LAD and 75% pRCA stenosis.  Now s/p DES to LCx/OM.   - Continue ASA 81 and ticagrelor.  - Atorvastatin 80 daily.  2. Acute systolic CHF/cardiogenic shock: IABP out 2/14 and norepinephrine off 2/15.  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  CVP 3-4, co-ox 59%.  - Off milrinone  co-ox ok. Still on digoxin. Will check level - Hold Lasix with low CVP.   - BP too soft for b-blocker or ACE 3. Heme: Hemoglobin/plts better with IABP out. 4. Atrial fibrillation: He had atrial fibrillation earlier in stay. Given need for antiplatelet agents post procedure and age, would avoid long-term anticoagulation. Now on amiodarone to maintain NSR.  5. CKD: Creatinine stable so far.  6. Delirium: This seems to be resolved.  7. ID: UTI and R groin abscess (s/p I&D 2/17) - Found to have UTI and R groin abscess. Cultures pending. On vanc and zosyn. CXR ok.  - Now afebrile. Can narrow abx once culture data back 8. Deconditioning - Continue PT/OT. He will need SNF at discharge.    Glori Bickers MD 10/10/2015 =

## 2015-10-11 DIAGNOSIS — I5021 Acute systolic (congestive) heart failure: Secondary | ICD-10-CM | POA: Insufficient documentation

## 2015-10-11 LAB — CARBOXYHEMOGLOBIN
Carboxyhemoglobin: 2 % — ABNORMAL HIGH (ref 0.5–1.5)
Methemoglobin: 1 % (ref 0.0–1.5)
O2 Saturation: 62.9 %
TOTAL HEMOGLOBIN: 11.1 g/dL — AB (ref 13.5–18.0)

## 2015-10-11 LAB — CBC
HCT: 33.9 % — ABNORMAL LOW (ref 39.0–52.0)
Hemoglobin: 11.1 g/dL — ABNORMAL LOW (ref 13.0–17.0)
MCH: 32.5 pg (ref 26.0–34.0)
MCHC: 32.7 g/dL (ref 30.0–36.0)
MCV: 99.1 fL (ref 78.0–100.0)
PLATELETS: 289 10*3/uL (ref 150–400)
RBC: 3.42 MIL/uL — ABNORMAL LOW (ref 4.22–5.81)
RDW: 13.4 % (ref 11.5–15.5)
WBC: 11.9 10*3/uL — ABNORMAL HIGH (ref 4.0–10.5)

## 2015-10-11 LAB — GLUCOSE, CAPILLARY
GLUCOSE-CAPILLARY: 117 mg/dL — AB (ref 65–99)
GLUCOSE-CAPILLARY: 178 mg/dL — AB (ref 65–99)
GLUCOSE-CAPILLARY: 203 mg/dL — AB (ref 65–99)
Glucose-Capillary: 141 mg/dL — ABNORMAL HIGH (ref 65–99)
Glucose-Capillary: 139 mg/dL — ABNORMAL HIGH (ref 65–99)

## 2015-10-11 LAB — BASIC METABOLIC PANEL
Anion gap: 12 (ref 5–15)
BUN: 35 mg/dL — AB (ref 6–20)
CALCIUM: 8.7 mg/dL — AB (ref 8.9–10.3)
CO2: 23 mmol/L (ref 22–32)
CREATININE: 1.39 mg/dL — AB (ref 0.61–1.24)
Chloride: 106 mmol/L (ref 101–111)
GFR, EST AFRICAN AMERICAN: 49 mL/min — AB (ref >60)
GFR, EST NON AFRICAN AMERICAN: 42 mL/min — AB (ref >60)
Glucose, Bld: 120 mg/dL — ABNORMAL HIGH (ref 65–99)
Potassium: 3.7 mmol/L (ref 3.5–5.1)
SODIUM: 141 mmol/L (ref 135–145)

## 2015-10-11 MED ORDER — FUROSEMIDE 40 MG PO TABS
40.0000 mg | ORAL_TABLET | Freq: Every day | ORAL | Status: DC
  Administered 2015-10-11 – 2015-10-14 (×4): 40 mg via ORAL
  Filled 2015-10-11 (×4): qty 1

## 2015-10-11 NOTE — Progress Notes (Signed)
Patient ID: COLLAN FACCHINI, male   DOB: 18-Apr-1922, 82 y.o.   MRN: [id number]   SUBJECTIVE: No chest pain, no dyspnea. IABP out 2/14 and norepinephrine off 2/15. Milrinone stopped 2/17.   On Friday concern for impending sepsis with fever to 101.6. UA +  R groin abscess I&D. Vanc & zosyn started   Bcx - NGTD Ucx - multiple organisms. Likely contaminant Wound - abundant Group A strep   Doing better. Afebrile BP improved. Co-ox 62%, CVP 3-4.  Denies SOB but CVP climbing. Now 9-10  He remains in NSR.     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   Scheduled Meds: . amiodarone  200 mg Oral BID  . antiseptic oral rinse  7 mL Mouth Rinse BID  . aspirin EC  81 mg Oral Daily  . atorvastatin  80 mg Oral q1800  . digoxin  0.125 mg Oral Daily  . heparin subcutaneous  5,000 Units Subcutaneous 3 times per day  . insulin aspart  0-15 Units Subcutaneous TID WC  . piperacillin-tazobactam (ZOSYN)  IV  3.375 g Intravenous 3 times per day  . sodium chloride flush  10-40 mL Intracatheter Q12H  . sodium chloride flush  3 mL Intravenous Q12H  . sodium chloride flush  3 mL Intravenous Q12H  . ticagrelor  90 mg Oral BID  . vancomycin  750 mg Intravenous Q24H   Continuous Infusions: . sodium chloride 10 mL/hr at 10/10/15 0800   PRN Meds:.sodium chloride, sodium chloride, acetaminophen, nitroGLYCERIN, ondansetron (ZOFRAN) IV, sodium chloride flush, sodium chloride flush, sodium chloride flush    Filed Vitals:   10/11/15 0600 10/11/15 0800 10/11/15 1000 10/11/15 1200  BP: 96/55 102/61 100/58   Pulse: 83 87 88   Temp:  98 F (36.7 C)  97.8 F (36.6 C)  TempSrc:  Oral    Resp: 25 34 26   Height:      Weight:      SpO2: 99% 100% 96%     Intake/Output Summary (Last 24 hours) at 10/11/15 1333 Last data filed at 10/11/15 [id number]  Gross per 24 hour  Intake    330 ml  Output    150 ml  Net    180 ml    LABS: Basic Metabolic  Panel:  Recent Labs  10/10/15 0520 10/11/15 0420  NA 140 141  K 3.6 3.7  CL 108 106  CO2 22 23  GLUCOSE 175* 120*  BUN 35* 35*  CREATININE 1.41* 1.39*  CALCIUM 8.5* 8.7*   Liver Function Tests: No results for input(s): AST, ALT, ALKPHOS, BILITOT, PROT, ALBUMIN in the last 72 hours. No results for input(s): LIPASE, AMYLASE in the last 72 hours. CBC:  Recent Labs  10/10/15 0521 10/11/15 0420  WBC 14.9* 11.9*  HGB 11.0* 11.1*  HCT 34.2* 33.9*  MCV 97.7 99.1  PLT 240 289   Cardiac Enzymes: No results for input(s): CKTOTAL, CKMB, CKMBINDEX, TROPONINI in the last 72 hours. BNP: Invalid input(s): POCBNP D-Dimer: No results for input(s): DDIMER in the last 72 hours. Hemoglobin A1C: No results for input(s): HGBA1C in the last 72 hours. Fasting Lipid Panel: No results for input(s): CHOL, HDL, LDLCALC, TRIG, CHOLHDL, LDLDIRECT in the last 72 hours. Thyroid Function Tests: No results for input(s): TSH, T4TOTAL, T3FREE, THYROIDAB in the last 72 hours.  Invalid input(s): FREET3 Anemia Panel: No results for input(s): VITAMINB12, FOLATE, FERRITIN, TIBC, IRON, RETICCTPCT in the last 72 hours.  RADIOLOGY:  Dg Chest 2 View  10/03/2015  CLINICAL DATA:  Fall today and generalized weakness EXAM: CHEST  2 VIEW COMPARISON:  11/28/2013 FINDINGS: Cardiac shadow is mildly enlarged but stable. Aortic calcifications are again seen. The lungs are well aerated bilaterally. Mild interstitial changes are noted within both lungs. No focal confluent infiltrate is seen. No acute bony abnormality is noted. IMPRESSION: No acute abnormality noted. Electronically Signed   By: Inez Catalina M.D.   On: 10/03/2015 18:59   Ct Head Wo Contrast  10/08/2015  CLINICAL DATA:  Confusion.  History of hypertension and diabetes. EXAM: CT HEAD WITHOUT CONTRAST TECHNIQUE: Contiguous axial images were obtained from the base of the skull through the vertex without intravenous contrast. COMPARISON:  None. FINDINGS: No  evidence for acute infarction, hemorrhage, mass lesion, hydrocephalus, or extra-axial fluid. Advanced cerebral and cerebellar atrophy. Extensive hypoattenuation of white matter, likely chronic microvascular ischemic change. Calvarium is intact. There is been previous ocular banding surgery. There is calcification of the skull base internal carotid arteries and distal BILATERAL vertebral arteries. Minor chronic sinus disease. No mastoid fluid. IMPRESSION: Atrophy and small vessel disease.  No acute intracranial findings. Electronically Signed   By: Staci Righter M.D.   On: 10/08/2015 14:59   Dg Chest Port 1 View  10/08/2015  CLINICAL DATA:  Confusion EXAM: PORTABLE CHEST 1 VIEW COMPARISON:  Portable chest x-ray of 10/06/2015 FINDINGS: The previously noted interstitial opacities primarily throughout the right lung have cleared. There is mild basilar volume loss right greater than left. Cardiomegaly is stable. The right PICC line tip overlies the lower SVC. No pneumothorax is seen. IMPRESSION: 1. Marked improvement in interstitial opacities particularly throughout the right lung. 2. Stable cardiomegaly with mild basilar volume loss. Electronically Signed   By: Ivar Drape M.D.   On: 10/08/2015 08:15   Dg Chest Port 1 View  10/06/2015  CLINICAL DATA:  CHF, STEMI, cardiogenic shock. EXAM: PORTABLE CHEST 1 VIEW COMPARISON:  Portable chest x-ray of October 05, 2015 FINDINGS: The lungs are adequately inflated. The pulmonary interstitial markings remain increased. The pulmonary vascularity remains engorged. The cardiac silhouette remains enlarged. There is no pleural effusion or pneumothorax. The right-sided PICC line tip projects over the distal third of the SVC. IMPRESSION: Persistent pulmonary interstitial edema little changed from yesterday's study. Cardiomegaly with pulmonary vascular congestion, stable. Electronically Signed   By: David  Martinique M.D.   On: 10/06/2015 07:27   Dg Chest Port 1 View  10/05/2015   CLINICAL DATA:  New PICC Line EXAM: PORTABLE CHEST 1 VIEW COMPARISON:  10/05/2015 FINDINGS: Interval placement RIGHT PICC line with tip in distal SVC. Stable cardiac silhouette. Mild interstitial/pulmonary edema similar. No pneumothorax. IMPRESSION: 1. RIGHT PICC line in good position. 2. Mild interstitial / pulmonary edema. Electronically Signed   By: Suzy Bouchard M.D.   On: 10/05/2015 10:01   Dg Chest Port 1 View  10/05/2015  CLINICAL DATA:  CHF, STEMI, cardiogenic shock. EXAM: PORTABLE CHEST 1 VIEW COMPARISON:  Portable chest x-ray dated October 04, 2015 FINDINGS: The lungs are well-expanded. The pulmonary interstitial markings are less conspicuous today. The pulmonary vascularity is more distinct and less engorged. The cardiac silhouette remains enlarged. There is no significant pleural effusion. The retrocardiac region on the left is more dense however. There is calcification in the wall of the ascending aorta and aortic arch. IMPRESSION: Improved appearance of the pulmonary interstitium and pulmonary vascularity consistent with decreased CHF. Developing left lower lobe atelectasis. There is no pleural effusion or pneumothorax.  Electronically Signed   By: David  Martinique M.D.   On: 10/05/2015 07:32   Portable Chest X-ray 1 View  10/04/2015  CLINICAL DATA:  Acute onset of shortness of breath. Initial encounter. EXAM: PORTABLE CHEST 1 VIEW COMPARISON:  Chest radiograph performed 10/03/2015 FINDINGS: The lungs are well-aerated. Vascular congestion is noted. Increased interstitial markings raise concern for pulmonary edema. There is no evidence of pleural effusion or pneumothorax. The cardiomediastinal silhouette is mildly enlarged. No acute osseous abnormalities are seen. An intra-aortic balloon pump tip is noted 2 cm below the aortic knob, as expected. IMPRESSION: Vascular congestion and mild cardiomegaly. Increased interstitial markings raise concern for pulmonary edema, new from the prior study.  Electronically Signed   By: Garald Balding M.D.   On: 10/04/2015 00:44    PHYSICAL EXAM General: NAD Neck: JVP 9, no thyromegaly or thyroid nodule.  Lungs:clear CV: Heart regular S1/S2, no S3/S4, no murmur.  No peripheral edema.   Abdomen: Soft, nontender, no hepatosplenomegaly, no distention.  Neurologic: Alert and oriented to person/place, no confusion this morning Psych: Normal affect. Extremities: No clubbing or cyanosis.  R groin site with improving cellulitis. Site is packed  TELEMETRY: Reviewed telemetry pt in NSR 90-100   ASSESSMENT AND PLAN: 82 yo with history of suspected prior silent MI was admitted with lateral STEMI/cardiogenic shock.  He had PCI to LCx/OM and IABP placement.  1. CAD: Lateral STEMI. LHC with culprit 99% LCx lesion, but also chronic occlusion of the LAD and 75% pRCA stenosis.  Now s/p DES to LCx/OM.   - Continue ASA 81 and ticagrelor.  - Atorvastatin 80 daily.  2. Acute systolic CHF/cardiogenic shock: IABP out 2/14 and norepinephrine off 2/15.  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  CVP 3-4, co-ox 59%.  - Off milrinone  co-ox ok. Still on digoxin. (level < 0.2 on 2/16) - CVP 9-10 today. Restart lasix 40mg  daily.  - BP too soft for b-blocker or ACE 3. Atrial fibrillation: He had atrial fibrillation earlier in stay. Given need for antiplatelet agents post procedure and age, would avoid long-term anticoagulation. Now on amiodarone to maintain NSR.  4. CKD stage 3: Creatinine stable so far.  5. Delirium: This seems to be resolved.  6. ID: Sepsis due to UTI and R groin abscess (s/p I&D 2/17) - Found to have sepsis in setting UTI and R groin abscess. Now improved - Wound cx with Group A strep. On vanc and zosyn - can narrow tomorrow (48 hours). CXR ok.  - Change wound packing with iodoform daily 7. Deconditioning - Continue PT/OT. He will need SNF at discharge. Doubt he will qualify for CIR.   Transfer to SDU.    Glori Bickers MD 10/11/2015

## 2015-10-11 NOTE — Clinical Social Work Note (Addendum)
CSW spoke with patient's daughter Chad French and presented bed offers.  Patient's daughter will research offers and discuss with patient's wife and let CSW know on Monday which SNF they have chosen.  Unit CSW to continue following patient's progress throughout discharge planning.  CSW also explained to patient's daughter about how insurance will pay for stay and what they would have to do if they are looking at long term care placement after rehab.  CSW explained to patient's daughter they would have to contact Department of Social Services to apply for long term care Medicaid.  Patient's daughter requested a list of SNFs so they can research other facilities if they are considering long term care.  Jones Broom. [place], MSW, [place] 10/11/2015 5:19 PM

## 2015-10-12 ENCOUNTER — Other Ambulatory Visit (HOSPITAL_COMMUNITY): Payer: Medicare Other

## 2015-10-12 DIAGNOSIS — I5021 Acute systolic (congestive) heart failure: Secondary | ICD-10-CM

## 2015-10-12 DIAGNOSIS — L02214 Cutaneous abscess of groin: Secondary | ICD-10-CM

## 2015-10-12 LAB — GLUCOSE, CAPILLARY
GLUCOSE-CAPILLARY: 166 mg/dL — AB (ref 65–99)
GLUCOSE-CAPILLARY: 186 mg/dL — AB (ref 65–99)
GLUCOSE-CAPILLARY: 136 mg/dL — AB (ref 65–99)
Glucose-Capillary: 112 mg/dL — ABNORMAL HIGH (ref 65–99)

## 2015-10-12 LAB — WOUND CULTURE

## 2015-10-12 LAB — BASIC METABOLIC PANEL
ANION GAP: 13 (ref 5–15)
BUN: 35 mg/dL — ABNORMAL HIGH (ref 6–20)
CHLORIDE: 106 mmol/L (ref 101–111)
CO2: 22 mmol/L (ref 22–32)
Calcium: 8.5 mg/dL — ABNORMAL LOW (ref 8.9–10.3)
Creatinine, Ser: 1.46 mg/dL — ABNORMAL HIGH (ref 0.61–1.24)
GFR calc non Af Amer: 40 mL/min — ABNORMAL LOW (ref >60)
GFR, EST AFRICAN AMERICAN: 46 mL/min — AB (ref >60)
Glucose, Bld: 155 mg/dL — ABNORMAL HIGH (ref 65–99)
POTASSIUM: 3.5 mmol/L (ref 3.5–5.1)
Sodium: 141 mmol/L (ref 135–145)

## 2015-10-12 LAB — CBC
HCT: 33.1 % — ABNORMAL LOW (ref 39.0–52.0)
HEMOGLOBIN: 10.5 g/dL — AB (ref 13.0–17.0)
MCH: 31 pg (ref 26.0–34.0)
MCHC: 31.7 g/dL (ref 30.0–36.0)
MCV: 97.6 fL (ref 78.0–100.0)
Platelets: 358 10*3/uL (ref 150–400)
RBC: 3.39 MIL/uL — AB (ref 4.22–5.81)
RDW: 13.3 % (ref 11.5–15.5)
WBC: 10.9 10*3/uL — ABNORMAL HIGH (ref 4.0–10.5)

## 2015-10-12 LAB — CARBOXYHEMOGLOBIN
CARBOXYHEMOGLOBIN: 1 % (ref 0.5–1.5)
METHEMOGLOBIN: 0.9 % (ref 0.0–1.5)
O2 SAT: 62.8 %
Total hemoglobin: 14.2 g/dL (ref 13.5–18.0)

## 2015-10-12 MED ORDER — IPRATROPIUM-ALBUTEROL 0.5-2.5 (3) MG/3ML IN SOLN
3.0000 mL | Freq: Four times a day (QID) | RESPIRATORY_TRACT | Status: DC
  Administered 2015-10-12 – 2015-10-13 (×4): 3 mL via RESPIRATORY_TRACT
  Filled 2015-10-12 (×5): qty 3

## 2015-10-12 MED ORDER — POTASSIUM CHLORIDE CRYS ER 20 MEQ PO TBCR
40.0000 meq | EXTENDED_RELEASE_TABLET | Freq: Once | ORAL | Status: AC
  Administered 2015-10-12: 40 meq via ORAL
  Filled 2015-10-12: qty 2

## 2015-10-12 MED ORDER — LISINOPRIL 2.5 MG PO TABS
2.5000 mg | ORAL_TABLET | Freq: Every day | ORAL | Status: DC
  Administered 2015-10-12 – 2015-10-20 (×8): 2.5 mg via ORAL
  Filled 2015-10-12 (×9): qty 1

## 2015-10-12 NOTE — Progress Notes (Signed)
[id number] Did appropriate ed with daughter as pt confused. Gave stent card and stressed importance of brilinta with stent. Has brilinta card. Gave low sodium handouts and stressed importance of with low EF. Daughter stated that her mother in law has CHF and she is knowledgeable re sodium and daily weights. Ex ed not appropriate as pt not walking with PT yet. Did not discuss CRP 2 as daughter stated she does not feel that her father will ever leave SNF and he is not able to walk. Will sign off as pt not apprpriate for our services. Graylon Good RN BSN 10/12/2015 11:45 AM

## 2015-10-12 NOTE — Progress Notes (Signed)
fam req to speak w sw. sw in meeting so cm spoke w da and pt. They now would like [facility] only 1 mile from their home. They also req private room. Have text sw w this inform. Will cont to moniter.

## 2015-10-12 NOTE — Progress Notes (Signed)
CSW informed of family desire for pt to go to [facility] sent referral to facility  They anticipate being able to accept patient when stable (tomorrow vs Wednesday) and are hopeful that they will have a private room available  CSW will continue to follow  Domenica Reamer, [place] Worker [phone]

## 2015-10-12 NOTE — Progress Notes (Signed)
Admission note:  Arrival Method: arrived on a hospital bed Mental Orientation: alert and oriented  Telemetry: monitor placed ccmd notifieded Assessment: see flowsheet Skin: warm and dry IV: PICC on rt upper arm Pain: none Fall Prevention Safety Plan: low bed, non skid socks call light within reach Admission Screening:  6700 Orientation: Patient has been oriented to the unit, staff and to the room. Will continue to monitor Oren Beckmann, RN

## 2015-10-12 NOTE — Progress Notes (Signed)
Report called to 2W

## 2015-10-12 NOTE — Progress Notes (Signed)
Patient ID: KNIGHTLY MARKELL, male   DOB: 1921/09/23, 82 y.o.   MRN: [id number]   SUBJECTIVE: No chest pain, no dyspnea. IABP out 2/14 and norepinephrine off 2/15. Milrinone stopped 2/17.   On Friday concern for impending sepsis with fever to 101.6.  R groin abscess I&D. Vanc & zosyn started   Bcx - NGTD Ucx - multiple organisms. Likely contaminant Wound - abundant Group A strep   Yesterday started on po lasix. Continued on vanc and zosyn for groin abscess. CO-OX 63%. CVP 5.  Mild dyspnea at rest.   He remains in NSR.   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   Scheduled Meds: . amiodarone  200 mg Oral BID  . antiseptic oral rinse  7 mL Mouth Rinse BID  . aspirin EC  81 mg Oral Daily  . atorvastatin  80 mg Oral q1800  . digoxin  0.125 mg Oral Daily  . furosemide  40 mg Oral Daily  . heparin subcutaneous  5,000 Units Subcutaneous 3 times per day  . insulin aspart  0-15 Units Subcutaneous TID WC  . piperacillin-tazobactam (ZOSYN)  IV  3.375 g Intravenous 3 times per day  . sodium chloride flush  10-40 mL Intracatheter Q12H  . sodium chloride flush  3 mL Intravenous Q12H  . sodium chloride flush  3 mL Intravenous Q12H  . ticagrelor  90 mg Oral BID  . vancomycin  750 mg Intravenous Q24H   Continuous Infusions: . sodium chloride 10 mL/hr at 10/11/15 0800   PRN Meds:.sodium chloride, sodium chloride, acetaminophen, nitroGLYCERIN, ondansetron (ZOFRAN) IV, sodium chloride flush, sodium chloride flush, sodium chloride flush    Filed Vitals:   10/12/15 0200 10/12/15 0400 10/12/15 0500 10/12/15 0600  BP: 94/56 93/59  92/60  Pulse: 85 77  79  Temp:  98 F (36.7 C)    TempSrc:  Axillary    Resp: 20 35  17  Height:      Weight:   164 lb (74.39 kg)   SpO2: 96% 100%  100%    Intake/Output Summary (Last 24 hours) at 10/12/15 0735 Last data filed at 10/12/15 [id number]  Gross per 24 hour  Intake   1350 ml  Output       0 ml  Net   1350 ml    LABS: Basic Metabolic Panel:  Recent Labs  10/11/15 0420 10/12/15 0145  NA 141 141  K 3.7 3.5  CL 106 106  CO2 23 22  GLUCOSE 120* 155*  BUN 35* 35*  CREATININE 1.39* 1.46*  CALCIUM 8.7* 8.5*   Liver Function Tests: No results for input(s): AST, ALT, ALKPHOS, BILITOT, PROT, ALBUMIN in the last 72 hours. No results for input(s): LIPASE, AMYLASE in the last 72 hours. CBC:  Recent Labs  10/11/15 0420 10/12/15 0145  WBC 11.9* 10.9*  HGB 11.1* 10.5*  HCT 33.9* 33.1*  MCV 99.1 97.6  PLT 289 358   Cardiac Enzymes: No results for input(s): CKTOTAL, CKMB, CKMBINDEX, TROPONINI in the last 72 hours. BNP: Invalid input(s): POCBNP D-Dimer: No results for input(s): DDIMER in the last 72 hours. Hemoglobin A1C: No results for input(s): HGBA1C in the last 72 hours. Fasting Lipid Panel: No results for input(s): CHOL, HDL, LDLCALC, TRIG, CHOLHDL, LDLDIRECT in the last 72 hours. Thyroid Function Tests: No results for input(s): TSH, T4TOTAL, T3FREE, THYROIDAB in the last 72 hours.  Invalid input(s): FREET3 Anemia Panel: No results for input(s): VITAMINB12, FOLATE, FERRITIN,  TIBC, IRON, RETICCTPCT in the last 72 hours.  RADIOLOGY: Dg Chest 2 View  10/03/2015  CLINICAL DATA:  Fall today and generalized weakness EXAM: CHEST  2 VIEW COMPARISON:  11/28/2013 FINDINGS: Cardiac shadow is mildly enlarged but stable. Aortic calcifications are again seen. The lungs are well aerated bilaterally. Mild interstitial changes are noted within both lungs. No focal confluent infiltrate is seen. No acute bony abnormality is noted. IMPRESSION: No acute abnormality noted. Electronically Signed   By: Inez Catalina M.D.   On: 10/03/2015 18:59   Ct Head Wo Contrast  10/08/2015  CLINICAL DATA:  Confusion.  History of hypertension and diabetes. EXAM: CT HEAD WITHOUT CONTRAST TECHNIQUE: Contiguous axial images were obtained from the base of the skull through the vertex without  intravenous contrast. COMPARISON:  None. FINDINGS: No evidence for acute infarction, hemorrhage, mass lesion, hydrocephalus, or extra-axial fluid. Advanced cerebral and cerebellar atrophy. Extensive hypoattenuation of white matter, likely chronic microvascular ischemic change. Calvarium is intact. There is been previous ocular banding surgery. There is calcification of the skull base internal carotid arteries and distal BILATERAL vertebral arteries. Minor chronic sinus disease. No mastoid fluid. IMPRESSION: Atrophy and small vessel disease.  No acute intracranial findings. Electronically Signed   By: Staci Righter M.D.   On: 10/08/2015 14:59   Dg Chest Port 1 View  10/08/2015  CLINICAL DATA:  Confusion EXAM: PORTABLE CHEST 1 VIEW COMPARISON:  Portable chest x-ray of 10/06/2015 FINDINGS: The previously noted interstitial opacities primarily throughout the right lung have cleared. There is mild basilar volume loss right greater than left. Cardiomegaly is stable. The right PICC line tip overlies the lower SVC. No pneumothorax is seen. IMPRESSION: 1. Marked improvement in interstitial opacities particularly throughout the right lung. 2. Stable cardiomegaly with mild basilar volume loss. Electronically Signed   By: Ivar Drape M.D.   On: 10/08/2015 08:15   Dg Chest Port 1 View  10/06/2015  CLINICAL DATA:  CHF, STEMI, cardiogenic shock. EXAM: PORTABLE CHEST 1 VIEW COMPARISON:  Portable chest x-ray of October 05, 2015 FINDINGS: The lungs are adequately inflated. The pulmonary interstitial markings remain increased. The pulmonary vascularity remains engorged. The cardiac silhouette remains enlarged. There is no pleural effusion or pneumothorax. The right-sided PICC line tip projects over the distal third of the SVC. IMPRESSION: Persistent pulmonary interstitial edema little changed from yesterday's study. Cardiomegaly with pulmonary vascular congestion, stable. Electronically Signed   By: David  Martinique M.D.   On:  10/06/2015 07:27   Dg Chest Port 1 View  10/05/2015  CLINICAL DATA:  New PICC Line EXAM: PORTABLE CHEST 1 VIEW COMPARISON:  10/05/2015 FINDINGS: Interval placement RIGHT PICC line with tip in distal SVC. Stable cardiac silhouette. Mild interstitial/pulmonary edema similar. No pneumothorax. IMPRESSION: 1. RIGHT PICC line in good position. 2. Mild interstitial / pulmonary edema. Electronically Signed   By: Suzy Bouchard M.D.   On: 10/05/2015 10:01   Dg Chest Port 1 View  10/05/2015  CLINICAL DATA:  CHF, STEMI, cardiogenic shock. EXAM: PORTABLE CHEST 1 VIEW COMPARISON:  Portable chest x-ray dated October 04, 2015 FINDINGS: The lungs are well-expanded. The pulmonary interstitial markings are less conspicuous today. The pulmonary vascularity is more distinct and less engorged. The cardiac silhouette remains enlarged. There is no significant pleural effusion. The retrocardiac region on the left is more dense however. There is calcification in the wall of the ascending aorta and aortic arch. IMPRESSION: Improved appearance of the pulmonary interstitium and pulmonary vascularity consistent with decreased CHF. Developing left  lower lobe atelectasis. There is no pleural effusion or pneumothorax. Electronically Signed   By: David  Martinique M.D.   On: 10/05/2015 07:32   Portable Chest X-ray 1 View  10/04/2015  CLINICAL DATA:  Acute onset of shortness of breath. Initial encounter. EXAM: PORTABLE CHEST 1 VIEW COMPARISON:  Chest radiograph performed 10/03/2015 FINDINGS: The lungs are well-aerated. Vascular congestion is noted. Increased interstitial markings raise concern for pulmonary edema. There is no evidence of pleural effusion or pneumothorax. The cardiomediastinal silhouette is mildly enlarged. No acute osseous abnormalities are seen. An intra-aortic balloon pump tip is noted 2 cm below the aortic knob, as expected. IMPRESSION: Vascular congestion and mild cardiomegaly. Increased interstitial markings raise  concern for pulmonary edema, new from the prior study. Electronically Signed   By: Garald Balding M.D.   On: 10/04/2015 00:44    PHYSICAL EXAM CVP 5 General: NAD. In bed  Neck: JVP 7-8 cm. No thyromegaly or thyroid nodule.  Lungs:EW on 4 liters.  CV: Heart regular S1/S2, no S3/S4, no murmur.  No peripheral edema.   Abdomen: Soft, nontender, no hepatosplenomegaly, no distention.  Neurologic: Alert and oriented to person/place, no confusion this morning Psych: Normal affect. Extremities: No clubbing or cyanosis.  R groin site ok. Dressing intact   TELEMETRY: Reviewed telemetry pt in NSR 90-100   ASSESSMENT AND PLAN: 82 yo with history of suspected prior silent MI was admitted with lateral STEMI/cardiogenic shock.  He had PCI to LCx/OM and IABP placement.  1. CAD: Lateral STEMI. LHC with culprit 99% LCx lesion, but also chronic occlusion of the LAD and 75% pRCA stenosis.  Now s/p DES to LCx/OM.   - Continue ASA 81 and ticagrelor.  - Atorvastatin 80 daily.  2. Acute systolic CHF/cardiogenic shock: IABP out 2/14 and norepinephrine off 2/15.  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  CVP 5.  - Off milrinone  co-ox stable 63%.  -Continue lasix 40 mg daily.  - BP too soft for b-blocker or ACE 3. Atrial fibrillation: He had atrial fibrillation earlier in stay. Given need for antiplatelet agents post procedure and age, would avoid long-term anticoagulation. Now on amiodarone to maintain NSR.   4. CKD stage 3: Creatinine stable so far. Creatinine 1.46. 5. Delirium: This seems to be resolved.  6. ID: Sepsis due to UTI and R groin abscess (s/p I&D 2/17). - Found to have sepsis in setting UTI and R groin abscess. Now improved - Wound cx with Group A strep. Stop vanc. Continue zosyn. Waiting on sensitivities.   - Change wound packing with iodoform daily 7. Deconditioning - Continue PT/OT. He will need SNF at discharge. Doubt he will qualify for CIR.  8. Dyspnea: Expiratory wheezes. Not related to  volume overload. Add duonebs q6.   Transfer to SDU.    Amy Clegg NP-C  10/12/2015   Patient seen with NP, agree with the above note.  Improving overall.  Continue po Lasix, add Duonebs.  Continue Zosyn for now pending sensitivities of group A Strep.    SBP 90s-100s.  Will try low dose lisinopril 2.5 daily.  Continue digoxin.   Needs work with PT, may go to floor.  To SNF perhaps Tuesday or Wednesday.   Loralie Champagne 10/12/2015 8:03 AM

## 2015-10-12 NOTE — Care Management Important Message (Signed)
Important Message  Patient Details  Name: Chad French MRN: [id number] Date of Birth: February 19, 1922   Medicare Important Message Given:  Yes    Loann Quill 10/12/2015, 10:49 AM

## 2015-10-13 LAB — GLUCOSE, CAPILLARY
GLUCOSE-CAPILLARY: 139 mg/dL — AB (ref 65–99)
GLUCOSE-CAPILLARY: 143 mg/dL — AB (ref 65–99)
Glucose-Capillary: 121 mg/dL — ABNORMAL HIGH (ref 65–99)
Glucose-Capillary: 130 mg/dL — ABNORMAL HIGH (ref 65–99)

## 2015-10-13 LAB — BASIC METABOLIC PANEL
Anion gap: 9 (ref 5–15)
Anion gap: 15 (ref 5–15)
BUN: 36 mg/dL — AB (ref 6–20)
BUN: 36 mg/dL — AB (ref 6–20)
CALCIUM: 8.5 mg/dL — AB (ref 8.9–10.3)
CHLORIDE: 108 mmol/L (ref 101–111)
CO2: 24 mmol/L (ref 22–32)
CO2: 23 mmol/L (ref 22–32)
CREATININE: 1.59 mg/dL — AB (ref 0.61–1.24)
CREATININE: 1.67 mg/dL — AB (ref 0.61–1.24)
Calcium: 8.8 mg/dL — ABNORMAL LOW (ref 8.9–10.3)
Chloride: 110 mmol/L (ref 101–111)
GFR calc Af Amer: 39 mL/min — ABNORMAL LOW (ref >60)
GFR calc non Af Amer: 36 mL/min — ABNORMAL LOW (ref >60)
GFR calc non Af Amer: 34 mL/min — ABNORMAL LOW (ref >60)
GFR, EST AFRICAN AMERICAN: 42 mL/min — AB (ref >60)
Glucose, Bld: 173 mg/dL — ABNORMAL HIGH (ref 65–99)
Glucose, Bld: 151 mg/dL — ABNORMAL HIGH (ref 65–99)
POTASSIUM: 3.4 mmol/L — AB (ref 3.5–5.1)
Potassium: 3.5 mmol/L (ref 3.5–5.1)
SODIUM: 143 mmol/L (ref 135–145)
Sodium: 146 mmol/L — ABNORMAL HIGH (ref 135–145)

## 2015-10-13 LAB — CBC
HEMATOCRIT: 32.6 % — AB (ref 39.0–52.0)
Hemoglobin: 10.4 g/dL — ABNORMAL LOW (ref 13.0–17.0)
MCH: 31 pg (ref 26.0–34.0)
MCHC: 31.9 g/dL (ref 30.0–36.0)
MCV: 97.3 fL (ref 78.0–100.0)
PLATELETS: 385 10*3/uL (ref 150–400)
RBC: 3.35 MIL/uL — ABNORMAL LOW (ref 4.22–5.81)
RDW: 13.3 % (ref 11.5–15.5)
WBC: 10.6 10*3/uL — AB (ref 4.0–10.5)

## 2015-10-13 LAB — AMMONIA: Ammonia: 21 umol/L (ref 9–35)

## 2015-10-13 LAB — DIGOXIN LEVEL: DIGOXIN LVL: 0.3 ng/mL — AB (ref 0.8–2.0)

## 2015-10-13 MED ORDER — CEPHALEXIN 500 MG PO CAPS
500.0000 mg | ORAL_CAPSULE | Freq: Three times a day (TID) | ORAL | Status: DC
  Administered 2015-10-13 – 2015-10-16 (×10): 500 mg via ORAL
  Filled 2015-10-13 (×11): qty 1

## 2015-10-13 MED ORDER — IPRATROPIUM-ALBUTEROL 0.5-2.5 (3) MG/3ML IN SOLN
3.0000 mL | Freq: Four times a day (QID) | RESPIRATORY_TRACT | Status: DC | PRN
  Administered 2015-10-13: 3 mL via RESPIRATORY_TRACT
  Filled 2015-10-13: qty 3

## 2015-10-13 NOTE — Progress Notes (Addendum)
Pt was confused this morning, he called for help and when this RN got to the room, pt reported seeing cats on the window. Bed alarm is on, bed in lowest position, call light within reach, Loralie Champagne, MD notified. will continue to monitor. Oren Beckmann, RN

## 2015-10-13 NOTE — Progress Notes (Signed)
Patient ID: Chad French, male   DOB: 1922-02-05, 82 y.o.   MRN: [id number]   SUBJECTIVE:  IABP out 2/14 and norepinephrine off 2/15. Milrinone stopped 2/17.   On Friday concern for impending sepsis with fever to 101.6.  R groin abscess I&D. Vanc & zosyn started   Bcx - NGTD Ucx - multiple organisms. Likely contaminant Wound - abundant Group A strep   Yesterday vanc stopped and lisinopril added. Duonebs added for wheezing.    He remains in NSR.   Pleasantly confused. Denies SOB.   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   Scheduled Meds: . amiodarone  200 mg Oral BID  . antiseptic oral rinse  7 mL Mouth Rinse BID  . aspirin EC  81 mg Oral Daily  . atorvastatin  80 mg Oral q1800  . digoxin  0.125 mg Oral Daily  . furosemide  40 mg Oral Daily  . heparin subcutaneous  5,000 Units Subcutaneous 3 times per day  . insulin aspart  0-15 Units Subcutaneous TID WC  . lisinopril  2.5 mg Oral Daily  . piperacillin-tazobactam (ZOSYN)  IV  3.375 g Intravenous 3 times per day  . sodium chloride flush  10-40 mL Intracatheter Q12H  . sodium chloride flush  3 mL Intravenous Q12H  . sodium chloride flush  3 mL Intravenous Q12H  . ticagrelor  90 mg Oral BID   Continuous Infusions: . sodium chloride 10 mL/hr at 10/12/15 0800   PRN Meds:.sodium chloride, sodium chloride, acetaminophen, ipratropium-albuterol, nitroGLYCERIN, ondansetron (ZOFRAN) IV, sodium chloride flush, sodium chloride flush, sodium chloride flush    Filed Vitals:   10/12/15 2037 10/13/15 0044 10/13/15 0400 10/13/15 0836  BP: 85/47 111/52 96/50   Pulse: 88 87 79   Temp: 97.4 F (36.3 C)  97.8 F (36.6 C)   TempSrc: Oral  Axillary   Resp: 20  20   Height:      Weight:   156 lb (70.761 kg)   SpO2: 95%  100% 99%    Intake/Output Summary (Last 24 hours) at 10/13/15 0929 Last data filed at 10/13/15 [id number]  Gross per 24 hour  Intake    500 ml  Output       0 ml  Net    500 ml    LABS: Basic Metabolic Panel:  Recent Labs  10/12/15 0145 10/13/15 0455  NA 141 143  K 3.5 3.5  CL 106 110  CO2 22 24  GLUCOSE 155* 173*  BUN 35* 36*  CREATININE 1.46* 1.59*  CALCIUM 8.5* 8.5*   Liver Function Tests: No results for input(s): AST, ALT, ALKPHOS, BILITOT, PROT, ALBUMIN in the last 72 hours. No results for input(s): LIPASE, AMYLASE in the last 72 hours. CBC:  Recent Labs  10/12/15 0145 10/13/15 0455  WBC 10.9* 10.6*  HGB 10.5* 10.4*  HCT 33.1* 32.6*  MCV 97.6 97.3  PLT 358 385   Cardiac Enzymes: No results for input(s): CKTOTAL, CKMB, CKMBINDEX, TROPONINI in the last 72 hours. BNP: Invalid input(s): POCBNP D-Dimer: No results for input(s): DDIMER in the last 72 hours. Hemoglobin A1C: No results for input(s): HGBA1C in the last 72 hours. Fasting Lipid Panel: No results for input(s): CHOL, HDL, LDLCALC, TRIG, CHOLHDL, LDLDIRECT in the last 72 hours. Thyroid Function Tests: No results for input(s): TSH, T4TOTAL, T3FREE, THYROIDAB in the last 72 hours.  Invalid input(s): FREET3 Anemia Panel: No results for input(s): VITAMINB12, FOLATE, FERRITIN, TIBC, IRON, RETICCTPCT  in the last 72 hours.  RADIOLOGY: Dg Chest 2 View  10/03/2015  CLINICAL DATA:  Fall today and generalized weakness EXAM: CHEST  2 VIEW COMPARISON:  11/28/2013 FINDINGS: Cardiac shadow is mildly enlarged but stable. Aortic calcifications are again seen. The lungs are well aerated bilaterally. Mild interstitial changes are noted within both lungs. No focal confluent infiltrate is seen. No acute bony abnormality is noted. IMPRESSION: No acute abnormality noted. Electronically Signed   By: Inez Catalina M.D.   On: 10/03/2015 18:59   Ct Head Wo Contrast  10/08/2015  CLINICAL DATA:  Confusion.  History of hypertension and diabetes. EXAM: CT HEAD WITHOUT CONTRAST TECHNIQUE: Contiguous axial images were obtained from the base of the skull through the vertex without  intravenous contrast. COMPARISON:  None. FINDINGS: No evidence for acute infarction, hemorrhage, mass lesion, hydrocephalus, or extra-axial fluid. Advanced cerebral and cerebellar atrophy. Extensive hypoattenuation of white matter, likely chronic microvascular ischemic change. Calvarium is intact. There is been previous ocular banding surgery. There is calcification of the skull base internal carotid arteries and distal BILATERAL vertebral arteries. Minor chronic sinus disease. No mastoid fluid. IMPRESSION: Atrophy and small vessel disease.  No acute intracranial findings. Electronically Signed   By: Staci Righter M.D.   On: 10/08/2015 14:59   Dg Chest Port 1 View  10/08/2015  CLINICAL DATA:  Confusion EXAM: PORTABLE CHEST 1 VIEW COMPARISON:  Portable chest x-ray of 10/06/2015 FINDINGS: The previously noted interstitial opacities primarily throughout the right lung have cleared. There is mild basilar volume loss right greater than left. Cardiomegaly is stable. The right PICC line tip overlies the lower SVC. No pneumothorax is seen. IMPRESSION: 1. Marked improvement in interstitial opacities particularly throughout the right lung. 2. Stable cardiomegaly with mild basilar volume loss. Electronically Signed   By: Ivar Drape M.D.   On: 10/08/2015 08:15   Dg Chest Port 1 View  10/06/2015  CLINICAL DATA:  CHF, STEMI, cardiogenic shock. EXAM: PORTABLE CHEST 1 VIEW COMPARISON:  Portable chest x-ray of October 05, 2015 FINDINGS: The lungs are adequately inflated. The pulmonary interstitial markings remain increased. The pulmonary vascularity remains engorged. The cardiac silhouette remains enlarged. There is no pleural effusion or pneumothorax. The right-sided PICC line tip projects over the distal third of the SVC. IMPRESSION: Persistent pulmonary interstitial edema little changed from yesterday's study. Cardiomegaly with pulmonary vascular congestion, stable. Electronically Signed   By: David  Martinique M.D.   On:  10/06/2015 07:27   Dg Chest Port 1 View  10/05/2015  CLINICAL DATA:  New PICC Line EXAM: PORTABLE CHEST 1 VIEW COMPARISON:  10/05/2015 FINDINGS: Interval placement RIGHT PICC line with tip in distal SVC. Stable cardiac silhouette. Mild interstitial/pulmonary edema similar. No pneumothorax. IMPRESSION: 1. RIGHT PICC line in good position. 2. Mild interstitial / pulmonary edema. Electronically Signed   By: Suzy Bouchard M.D.   On: 10/05/2015 10:01   Dg Chest Port 1 View  10/05/2015  CLINICAL DATA:  CHF, STEMI, cardiogenic shock. EXAM: PORTABLE CHEST 1 VIEW COMPARISON:  Portable chest x-ray dated October 04, 2015 FINDINGS: The lungs are well-expanded. The pulmonary interstitial markings are less conspicuous today. The pulmonary vascularity is more distinct and less engorged. The cardiac silhouette remains enlarged. There is no significant pleural effusion. The retrocardiac region on the left is more dense however. There is calcification in the wall of the ascending aorta and aortic arch. IMPRESSION: Improved appearance of the pulmonary interstitium and pulmonary vascularity consistent with decreased CHF. Developing left lower lobe atelectasis.  There is no pleural effusion or pneumothorax. Electronically Signed   By: David  Martinique M.D.   On: 10/05/2015 07:32   Portable Chest X-ray 1 View  10/04/2015  CLINICAL DATA:  Acute onset of shortness of breath. Initial encounter. EXAM: PORTABLE CHEST 1 VIEW COMPARISON:  Chest radiograph performed 10/03/2015 FINDINGS: The lungs are well-aerated. Vascular congestion is noted. Increased interstitial markings raise concern for pulmonary edema. There is no evidence of pleural effusion or pneumothorax. The cardiomediastinal silhouette is mildly enlarged. No acute osseous abnormalities are seen. An intra-aortic balloon pump tip is noted 2 cm below the aortic knob, as expected. IMPRESSION: Vascular congestion and mild cardiomegaly. Increased interstitial markings raise  concern for pulmonary edema, new from the prior study. Electronically Signed   By: Garald Balding M.D.   On: 10/04/2015 00:44    PHYSICAL EXAM General: NAD. In bed  Neck: JVP 7-8 cm. No thyromegaly or thyroid nodule.  Lungs: Lungs clear. .  CV: Heart regular S1/S2, no S3/S4, no murmur.  No peripheral edema.   Abdomen: Soft, nontender, no hepatosplenomegaly, no distention.  Neurologic: Alert and oriented to person/place, no confusion this morning Psych: Confused. Oriented to self. Normal affect. Extremities: No clubbing or cyanosis.  R groin site ok. Dressing intact   TELEMETRY: NSR 60s.   ASSESSMENT AND PLAN: 82 yo with history of suspected prior silent MI was admitted with lateral STEMI/cardiogenic shock.  He had PCI to LCx/OM and IABP placement.  1. CAD: Lateral STEMI. LHC with culprit 99% LCx lesion, but also chronic occlusion of the LAD and 75% pRCA stenosis.  Now s/p DES to LCx/OM.   - Continue ASA 81 and ticagrelor.  - Atorvastatin 80 daily.  2. Acute systolic CHF/cardiogenic shock: IABP out 2/14 and norepinephrine off 2/15.  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 Off milrinone .  -Continue lasix 40 mg daily. -Continue lisinopril 2.5 mg daily.   - BP too soft for b-blocker  -Dig level 0.3 . Continue dig 0.125 mg daily.  3. Atrial fibrillation: He had atrial fibrillation earlier in stay. Given need for antiplatelet agents post procedure and age, would avoid long-term anticoagulation. Now on amiodarone to maintain NSR.   4. CKD stage 3: Creatinine 1.59. 5. Delirium: Confused. Oriented to self. Check ammonia.   6. ID: Sepsis due to UTI and R groin abscess (s/p I&D 2/17). - Found to have sepsis in setting UTI and R groin abscess. Now improved - Wound cx with Group A strep. Off vanc. Stop zosyn. Start keflex 500 mg every 8 hours x  6 days.   - Change wound packing with iodoform daily.  7. Deconditioning - Continue PT/OT. He will need SNF at discharge.  8. Dyspnea: Continue duonebs  q6.    Possible d/c to 24-48 hours .  Amy Clegg NP-C  10/13/2015   9:29 AM  Patient seen with NP, agree with the above note.  He is oriented to place but still has some confusion.  He can be re-directed.  Awake and alert.  Vitals stable.  Remains in NSR.  Hopefully will be ready for d/c to SNF tomorrow.   Transition abx to Keflex x 6 days.   Loralie Champagne 10/13/2015 3:30 PM

## 2015-10-13 NOTE — Progress Notes (Signed)
Per physician note anticipate DC 24-48 hours- CSW updated facility ([facility]) who will contact pt daughter to complete paperwork  CSW will continue to follow  Domenica Reamer, [place] Social Worker [phone]

## 2015-10-13 NOTE — Progress Notes (Signed)
Physical Therapy Treatment Patient Details Name: Chad French MRN: [id number] DOB: [birth year] Today's Date: 2015-11-02    History of Present Illness 82 yo with history of suspected prior silent MI was admitted with lateral STEMI/cardiogenic shock. He had PCI to LCx/OM and IABP placement.     PT Comments    Pt limited by impaired cognition today, requiring max cues to perform therapy and continuing to lay down during treatment. Pt continues to require max A for bed mobility.  Pt will benefit from continued PT services to decrease burden of care for d/c to SNF.  Follow Up Recommendations  SNF;Supervision/Assistance - 24 hour     Equipment Recommendations   (SNF)    Recommendations for Other Services       Precautions / Restrictions Precautions Precautions: Fall Restrictions Weight Bearing Restrictions: No    Mobility  Bed Mobility   Bed Mobility: Supine to Sit     Supine to sit: Max assist     General bed mobility comments: performed supine to sit x 4 due to pt continuing to lay down during session, pt requires max A for trunk and bilat LEs.  Pt requires total A for scooting up in bed at end of session  Transfers                    Ambulation/Gait                 Stairs            Wheelchair Mobility    Modified Rankin (Stroke Patients Only)       Balance   Sitting-balance support: Bilateral upper extremity supported Sitting balance-Leahy Scale: Poor Sitting balance - Comments: pt requires mod/max A to sit edge of bed to perform therex                            Cognition Arousal/Alertness: Awake/alert Behavior During Therapy: Restless Overall Cognitive Status: Impaired/Different from baseline Area of Impairment: Following commands;Safety/judgement;Awareness;Problem solving;Attention               General Comments: pt with difficulty following commands to participate in PT session, requires max cuing for  attention to task, motor planning    Exercises Total Joint Exercises Long Arc Quad: AROM;Both;5 reps General Exercises - Lower Extremity Hip Flexion/Marching: AROM;Both;5 reps;Seated    General Comments        Pertinent Vitals/Pain Faces Pain Scale: No hurt    Home Living                      Prior Function            PT Goals (current goals can now be found in the care plan section) Progress towards PT goals: Progressing toward goals    Frequency  Min 3X/week    PT Plan Current plan remains appropriate    Co-evaluation             End of Session Equipment Utilized During Treatment: Oxygen Activity Tolerance:  (pt limited by decreased cognition this session) Patient left: in bed;with call bell/phone within reach;with bed alarm set     Time: 1052-1106 PT Time Calculation (min) (ACUTE ONLY): 14 min  Charges:  $Therapeutic Activity: 8-22 mins                    G Codes:      Gerald Kuehl 2015-11-02, 11:07 AM

## 2015-10-13 NOTE — Progress Notes (Signed)
PRN treatment, pt mildly SOB. No other complications noted.

## 2015-10-14 ENCOUNTER — Inpatient Hospital Stay (HOSPITAL_COMMUNITY): Payer: Medicare Other

## 2015-10-14 LAB — BASIC METABOLIC PANEL
Anion gap: 9 (ref 5–15)
BUN: 34 mg/dL — ABNORMAL HIGH (ref 6–20)
CALCIUM: 8.7 mg/dL — AB (ref 8.9–10.3)
CO2: 26 mmol/L (ref 22–32)
CREATININE: 1.36 mg/dL — AB (ref 0.61–1.24)
Chloride: 109 mmol/L (ref 101–111)
GFR, EST AFRICAN AMERICAN: 50 mL/min — AB (ref >60)
GFR, EST NON AFRICAN AMERICAN: 44 mL/min — AB (ref >60)
Glucose, Bld: 193 mg/dL — ABNORMAL HIGH (ref 65–99)
Potassium: 3.7 mmol/L (ref 3.5–5.1)
SODIUM: 144 mmol/L (ref 135–145)

## 2015-10-14 LAB — CULTURE, BLOOD (ROUTINE X 2)
CULTURE: NO GROWTH
CULTURE: NO GROWTH

## 2015-10-14 LAB — CBC
HEMATOCRIT: 34.9 % — AB (ref 39.0–52.0)
Hemoglobin: 11.1 g/dL — ABNORMAL LOW (ref 13.0–17.0)
MCH: 31.4 pg (ref 26.0–34.0)
MCHC: 31.8 g/dL (ref 30.0–36.0)
MCV: 98.6 fL (ref 78.0–100.0)
PLATELETS: 500 10*3/uL — AB (ref 150–400)
RBC: 3.54 MIL/uL — ABNORMAL LOW (ref 4.22–5.81)
RDW: 13.5 % (ref 11.5–15.5)
WBC: 12.6 10*3/uL — ABNORMAL HIGH (ref 4.0–10.5)

## 2015-10-14 LAB — URINALYSIS, ROUTINE W REFLEX MICROSCOPIC
BILIRUBIN URINE: NEGATIVE
Glucose, UA: NEGATIVE mg/dL
KETONES UR: NEGATIVE mg/dL
Leukocytes, UA: NEGATIVE
NITRITE: NEGATIVE
Protein, ur: NEGATIVE mg/dL
Specific Gravity, Urine: 1.014 (ref 1.005–1.030)
pH: 5 (ref 5.0–8.0)

## 2015-10-14 LAB — GLUCOSE, CAPILLARY
GLUCOSE-CAPILLARY: 154 mg/dL — AB (ref 65–99)
GLUCOSE-CAPILLARY: 139 mg/dL — AB (ref 65–99)
Glucose-Capillary: 124 mg/dL — ABNORMAL HIGH (ref 65–99)
Glucose-Capillary: 123 mg/dL — ABNORMAL HIGH (ref 65–99)

## 2015-10-14 LAB — URINE MICROSCOPIC-ADD ON

## 2015-10-14 MED ORDER — FUROSEMIDE 20 MG PO TABS
20.0000 mg | ORAL_TABLET | Freq: Every day | ORAL | Status: DC
  Administered 2015-10-15 – 2015-10-20 (×6): 20 mg via ORAL
  Filled 2015-10-14 (×6): qty 1

## 2015-10-14 MED ORDER — AMIODARONE HCL 200 MG PO TABS
200.0000 mg | ORAL_TABLET | Freq: Every day | ORAL | Status: DC
  Administered 2015-10-15 – 2015-10-16 (×2): 200 mg via ORAL
  Filled 2015-10-14 (×2): qty 1

## 2015-10-14 NOTE — Progress Notes (Signed)
Paged Dr Claiborne Billings and made aware of sleep apnea and short runs of  [place] Type 1 with H.r. That goes down in the 30's then back to S.R. 80's R.N,. Aware and no orders given.

## 2015-10-14 NOTE — Consult Note (Signed)
   [facility] CM Inpatient Consult   10/14/2015  ALISTAIR SOLIZ 04/24/1922 [id number]   Patient screened for [facility] services. Noted discharge plan is for SNF. Has had x1 admit in past 6 months. Will not attempt to engage for [facility] at this time.   Marthenia Rolling, MSN-Ed, RN,BSN [facility] Liaison [phone]

## 2015-10-14 NOTE — Progress Notes (Signed)
Patient has short runs of sleep apnea.

## 2015-10-14 NOTE — Progress Notes (Signed)
Patient ID: Chad French, male   DOB: October 01, 1921, 82 y.o.   MRN: [id number]   SUBJECTIVE:  IABP out 2/14 and norepinephrine off 2/15. Milrinone stopped 2/17.   On 2/17 concern for impending sepsis with fever to 101.6.  R groin abscess I&D. Vanc & zosyn started   Bcx - NGTD Ucx - multiple organisms. Likely contaminant Wound - abundant Group A strep   Yesterday zosyn stopped and keflex started.  Bradycardic at night. Remains confused at night.      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   Scheduled Meds: . amiodarone  200 mg Oral BID  . antiseptic oral rinse  7 mL Mouth Rinse BID  . aspirin EC  81 mg Oral Daily  . atorvastatin  80 mg Oral q1800  . cephALEXin  500 mg Oral 3 times per day  . digoxin  0.125 mg Oral Daily  . furosemide  40 mg Oral Daily  . heparin subcutaneous  5,000 Units Subcutaneous 3 times per day  . insulin aspart  0-15 Units Subcutaneous TID WC  . lisinopril  2.5 mg Oral Daily  . sodium chloride flush  10-40 mL Intracatheter Q12H  . sodium chloride flush  3 mL Intravenous Q12H  . sodium chloride flush  3 mL Intravenous Q12H  . ticagrelor  90 mg Oral BID   Continuous Infusions: . sodium chloride 10 mL/hr at 10/12/15 0800   PRN Meds:.sodium chloride, sodium chloride, acetaminophen, ipratropium-albuterol, nitroGLYCERIN, ondansetron (ZOFRAN) IV, sodium chloride flush, sodium chloride flush, sodium chloride flush    Filed Vitals:   10/13/15 1945 10/13/15 2039 10/14/15 0326 10/14/15 0330  BP: 111/56  101/53   Pulse: 80  96   Temp: 98 F (36.7 C)  98 F (36.7 C)   TempSrc: Oral  Oral   Resp: 24  22   Height:      Weight:      SpO2: 99% 98%  100%    Intake/Output Summary (Last 24 hours) at 10/14/15 1049 Last data filed at 10/14/15 0913  Gross per 24 hour  Intake    980 ml  Output      0 ml  Net    980 ml    LABS: Basic Metabolic Panel:  Recent Labs  10/13/15 0455  10/13/15 1537  NA 143 146*  K 3.5 3.4*  CL 110 108  CO2 24 23  GLUCOSE 173* 151*  BUN 36* 36*  CREATININE 1.59* 1.67*  CALCIUM 8.5* 8.8*   Liver Function Tests: No results for input(s): AST, ALT, ALKPHOS, BILITOT, PROT, ALBUMIN in the last 72 hours. No results for input(s): LIPASE, AMYLASE in the last 72 hours. CBC:  Recent Labs  10/13/15 0455 10/14/15 0400  WBC 10.6* 12.6*  HGB 10.4* 11.1*  HCT 32.6* 34.9*  MCV 97.3 98.6  PLT 385 500*   Cardiac Enzymes: No results for input(s): CKTOTAL, CKMB, CKMBINDEX, TROPONINI in the last 72 hours. BNP: Invalid input(s): POCBNP D-Dimer: No results for input(s): DDIMER in the last 72 hours. Hemoglobin A1C: No results for input(s): HGBA1C in the last 72 hours. Fasting Lipid Panel: No results for input(s): CHOL, HDL, LDLCALC, TRIG, CHOLHDL, LDLDIRECT in the last 72 hours. Thyroid Function Tests: No results for input(s): TSH, T4TOTAL, T3FREE, THYROIDAB in the last 72 hours.  Invalid input(s): FREET3 Anemia Panel: No results for input(s): VITAMINB12, FOLATE, FERRITIN, TIBC, IRON, RETICCTPCT in the last 72 hours.  RADIOLOGY: Dg Chest 2 View  10/03/2015  CLINICAL DATA:  Fall today and generalized weakness EXAM: CHEST  2 VIEW COMPARISON:  11/28/2013 FINDINGS: Cardiac shadow is mildly enlarged but stable. Aortic calcifications are again seen. The lungs are well aerated bilaterally. Mild interstitial changes are noted within both lungs. No focal confluent infiltrate is seen. No acute bony abnormality is noted. IMPRESSION: No acute abnormality noted. Electronically Signed   By: Inez Catalina M.D.   On: 10/03/2015 18:59   Ct Head Wo Contrast  10/08/2015  CLINICAL DATA:  Confusion.  History of hypertension and diabetes. EXAM: CT HEAD WITHOUT CONTRAST TECHNIQUE: Contiguous axial images were obtained from the base of the skull through the vertex without intravenous contrast. COMPARISON:  None. FINDINGS: No evidence for acute infarction,  hemorrhage, mass lesion, hydrocephalus, or extra-axial fluid. Advanced cerebral and cerebellar atrophy. Extensive hypoattenuation of white matter, likely chronic microvascular ischemic change. Calvarium is intact. There is been previous ocular banding surgery. There is calcification of the skull base internal carotid arteries and distal BILATERAL vertebral arteries. Minor chronic sinus disease. No mastoid fluid. IMPRESSION: Atrophy and small vessel disease.  No acute intracranial findings. Electronically Signed   By: Staci Righter M.D.   On: 10/08/2015 14:59   Dg Chest Port 1 View  10/08/2015  CLINICAL DATA:  Confusion EXAM: PORTABLE CHEST 1 VIEW COMPARISON:  Portable chest x-ray of 10/06/2015 FINDINGS: The previously noted interstitial opacities primarily throughout the right lung have cleared. There is mild basilar volume loss right greater than left. Cardiomegaly is stable. The right PICC line tip overlies the lower SVC. No pneumothorax is seen. IMPRESSION: 1. Marked improvement in interstitial opacities particularly throughout the right lung. 2. Stable cardiomegaly with mild basilar volume loss. Electronically Signed   By: Ivar Drape M.D.   On: 10/08/2015 08:15   Dg Chest Port 1 View  10/06/2015  CLINICAL DATA:  CHF, STEMI, cardiogenic shock. EXAM: PORTABLE CHEST 1 VIEW COMPARISON:  Portable chest x-ray of October 05, 2015 FINDINGS: The lungs are adequately inflated. The pulmonary interstitial markings remain increased. The pulmonary vascularity remains engorged. The cardiac silhouette remains enlarged. There is no pleural effusion or pneumothorax. The right-sided PICC line tip projects over the distal third of the SVC. IMPRESSION: Persistent pulmonary interstitial edema little changed from yesterday's study. Cardiomegaly with pulmonary vascular congestion, stable. Electronically Signed   By: David  Martinique M.D.   On: 10/06/2015 07:27   Dg Chest Port 1 View  10/05/2015  CLINICAL DATA:  New PICC Line  EXAM: PORTABLE CHEST 1 VIEW COMPARISON:  10/05/2015 FINDINGS: Interval placement RIGHT PICC line with tip in distal SVC. Stable cardiac silhouette. Mild interstitial/pulmonary edema similar. No pneumothorax. IMPRESSION: 1. RIGHT PICC line in good position. 2. Mild interstitial / pulmonary edema. Electronically Signed   By: Suzy Bouchard M.D.   On: 10/05/2015 10:01   Dg Chest Port 1 View  10/05/2015  CLINICAL DATA:  CHF, STEMI, cardiogenic shock. EXAM: PORTABLE CHEST 1 VIEW COMPARISON:  Portable chest x-ray dated October 04, 2015 FINDINGS: The lungs are well-expanded. The pulmonary interstitial markings are less conspicuous today. The pulmonary vascularity is more distinct and less engorged. The cardiac silhouette remains enlarged. There is no significant pleural effusion. The retrocardiac region on the left is more dense however. There is calcification in the wall of the ascending aorta and aortic arch. IMPRESSION: Improved appearance of the pulmonary interstitium and pulmonary vascularity consistent with decreased CHF. Developing left lower lobe atelectasis. There is no pleural effusion or pneumothorax. Electronically Signed   By:  David  Martinique M.D.   On: 10/05/2015 07:32   Portable Chest X-ray 1 View  10/04/2015  CLINICAL DATA:  Acute onset of shortness of breath. Initial encounter. EXAM: PORTABLE CHEST 1 VIEW COMPARISON:  Chest radiograph performed 10/03/2015 FINDINGS: The lungs are well-aerated. Vascular congestion is noted. Increased interstitial markings raise concern for pulmonary edema. There is no evidence of pleural effusion or pneumothorax. The cardiomediastinal silhouette is mildly enlarged. No acute osseous abnormalities are seen. An intra-aortic balloon pump tip is noted 2 cm below the aortic knob, as expected. IMPRESSION: Vascular congestion and mild cardiomegaly. Increased interstitial markings raise concern for pulmonary edema, new from the prior study. Electronically Signed   By: Garald Balding M.D.   On: 10/04/2015 00:44    PHYSICAL EXAM General: NAD. In bed  Neck: JVP 7-8 cm. No thyromegaly or thyroid nodule.  Lungs: Lungs diminished.  CV: Heart regular S1/S2, no S3/S4, no murmur.  No peripheral edema.   Abdomen: Soft, nontender, no hepatosplenomegaly, no distention.  Neurologic: Alert and oriented to person/place, no confusion this morning Psych: Confused. Oriented to self. Normal affect. Extremities: No clubbing or cyanosis.  R groin site ok. Dressing intact   TELEMETRY: NSR 60s.   ASSESSMENT AND PLAN: 82 yo with history of suspected prior silent MI was admitted with lateral STEMI/cardiogenic shock.  He had PCI to LCx/OM and IABP placement.  1. CAD: Lateral STEMI. LHC with culprit 99% LCx lesion, but also chronic occlusion of the LAD and 75% pRCA stenosis.  Now s/p DES to LCx/OM.   - Continue ASA 81 and ticagrelor.  - Atorvastatin 80 daily.  2. Acute systolic CHF/cardiogenic shock: IABP out 2/14 and norepinephrine off 2/15.  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 Off milrinone - Cut back to lasix 20 mg daily. Volume status seems ok.  - Continue lisinopril 2.5 mg daily, may have to hold if creatinine goes up again.   - BP too soft for b-blocker  - Dig level 0.3. Continue dig 0.125 mg daily.  3. Atrial fibrillation: He had atrial fibrillation earlier in stay. Given need for antiplatelet agents post procedure and age, would avoid long-term anticoagulation. Now on amiodarone to maintain NSR.  Bradycardic over night.  - Cut back amio to 200 mg daily.    4. CKD stage 3: Creatinine 1.67. Cut back lasix to 20 mg daily.  5. Delirium: Waxing and waning confusion. Oriented to self and place.  Ammonia level 21.  Check UA and CXR.   6. ID: Sepsis due to UTI and R groin abscess (s/p I&D 2/17). - Found to have sepsis in setting UTI and R groin abscess.  - Wound cx with Group A strep. Off vanc. Stop zosyn. Continue keflex 500 mg every 8 hours x  6 days.   - Change wound packing  with iodoform daily.  - Today WBC trending up a little 10>12. CXR today. Check UA 7. Deconditioning - Continue PT/OT. He will need SNF at discharge.  8. Dyspnea: Continue duonebs q6. CXR now    Amy Clegg NP-C  10/14/2015  10:49 AM   Patient seen with NP, agree with the above note.   Still with delirium, waxing and waning.  Oriented to person and place but some confusion.  Afebrile, mild rise in WBCs.  Remains on cephalexin for groin wound infection, this looks ok.  Will send UA.  CXR today looks ok.   Creatinine mildly higher, cut back on Lasix to 20 mg daily.  Weight down, does  not appear volume overloaded.   Bradycardia overnight, may have been related to apnea.  Decreasing amiodarone to 200 mg daily.   Discussed with daughter.  Will need SNF.  Will hold until Friday due to delirium and due to issues with his elderly wife that daughter is dealing with.   Loralie Champagne 10/14/2015. 12:37 PM

## 2015-10-15 LAB — GLUCOSE, CAPILLARY
GLUCOSE-CAPILLARY: 161 mg/dL — AB (ref 65–99)
GLUCOSE-CAPILLARY: 165 mg/dL — AB (ref 65–99)
Glucose-Capillary: 101 mg/dL — ABNORMAL HIGH (ref 65–99)
Glucose-Capillary: 112 mg/dL — ABNORMAL HIGH (ref 65–99)

## 2015-10-15 LAB — CBC
HEMATOCRIT: 31.5 % — AB (ref 39.0–52.0)
Hemoglobin: 10 g/dL — ABNORMAL LOW (ref 13.0–17.0)
MCH: 31.2 pg (ref 26.0–34.0)
MCHC: 31.7 g/dL (ref 30.0–36.0)
MCV: 98.1 fL (ref 78.0–100.0)
PLATELETS: 465 10*3/uL — AB (ref 150–400)
RBC: 3.21 MIL/uL — ABNORMAL LOW (ref 4.22–5.81)
RDW: 13.5 % (ref 11.5–15.5)
WBC: 13.3 10*3/uL — AB (ref 4.0–10.5)

## 2015-10-15 LAB — URINE CULTURE
Culture: NO GROWTH
SPECIAL REQUESTS: NORMAL

## 2015-10-15 LAB — BASIC METABOLIC PANEL
Anion gap: 11 (ref 5–15)
BUN: 34 mg/dL — ABNORMAL HIGH (ref 6–20)
CALCIUM: 8.6 mg/dL — AB (ref 8.9–10.3)
CO2: 25 mmol/L (ref 22–32)
CREATININE: 1.53 mg/dL — AB (ref 0.61–1.24)
Chloride: 109 mmol/L (ref 101–111)
GFR, EST AFRICAN AMERICAN: 44 mL/min — AB (ref >60)
GFR, EST NON AFRICAN AMERICAN: 38 mL/min — AB (ref >60)
GLUCOSE: 131 mg/dL — AB (ref 65–99)
Potassium: 3.6 mmol/L (ref 3.5–5.1)
Sodium: 145 mmol/L (ref 135–145)

## 2015-10-15 MED ORDER — GLUCERNA SHAKE PO LIQD
237.0000 mL | Freq: Three times a day (TID) | ORAL | Status: DC
  Administered 2015-10-15 – 2015-10-19 (×7): 237 mL via ORAL

## 2015-10-15 NOTE — Progress Notes (Signed)
Nursing monitored pt having periods of apnea and heart rate dropped in the 30-40's. May benefit from cpap. Monitoring will continue.

## 2015-10-15 NOTE — Progress Notes (Addendum)
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  Patient with purulent drainage and swelling/erythema at previous right femoral IABP insertion site.  Patient s/p procedure 2/17: ABSCESS AND CELLULITIS   Patient not very responsive to RD interview. He was somewhat confused. PO intake variable at 30-50% per flowsheet records. Low braden score places patient at risk for skin breakdown. Will add oral nutrition supplements to help promote skin integrity.   RD unable to complete Nutrition Focused Physical Exam at this time.  Diet Order:  Diet heart healthy/carb modified Room service appropriate?: Yes; Fluid consistency:: Thin  Skin:  Reviewed, no issues   CBG (last 3)   Recent Labs  10/14/15 2051 10/15/15 0715 10/15/15 1136  GLUCAP 123* 101* 161*    Last BM:  2/22  Height:   Ht Readings from Last 1 Encounters:  10/04/15 5\' 7"  (1.702 m)    Weight:   Wt Readings from Last 1 Encounters:  10/15/15 148 lb (67.132 kg)    Ideal Body Weight:  67.2 kg  BMI:  Body mass index is 23.17 kg/(m^2).  Estimated Nutritional Needs:   Kcal:  1400-1600  Protein:  70-80 gm  Fluid:  >/= 1.5 L  EDUCATION NEEDS:   No education needs identified at this time  Arthur Holms, RD, LDN Pager #: [phone] After-Hours Pager #: [phone]

## 2015-10-15 NOTE — Progress Notes (Signed)
Wife called to state that the [facility]) had called her to say that her husband was on two powerful antibiotics and that he had a communicable disease (per wife's words) She is concerned this will delay him going to [place].

## 2015-10-15 NOTE — Progress Notes (Signed)
Patient ID: Chad French, male   DOB: [birth year], 82 y.o.   MRN: [id number]   SUBJECTIVE:  IABP out 2/14 and norepinephrine off 2/15. Milrinone stopped 2/17.   On 2/17 concern for impending sepsis with fever to 101.6.  R groin abscess I&D. Vanc & zosyn started   Bcx - NGTD Ucx - multiple organisms. Likely contaminant Wound - abundant Group A strep   Last night bradycardic at night. Oriented. Feeling better.       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   Scheduled Meds: . amiodarone  200 mg Oral Daily  . antiseptic oral rinse  7 mL Mouth Rinse BID  . aspirin EC  81 mg Oral Daily  . atorvastatin  80 mg Oral q1800  . cephALEXin  500 mg Oral 3 times per day  . digoxin  0.125 mg Oral Daily  . furosemide  20 mg Oral Daily  . heparin subcutaneous  5,000 Units Subcutaneous 3 times per day  . insulin aspart  0-15 Units Subcutaneous TID WC  . lisinopril  2.5 mg Oral Daily  . sodium chloride flush  10-40 mL Intracatheter Q12H  . sodium chloride flush  3 mL Intravenous Q12H  . ticagrelor  90 mg Oral BID   Continuous Infusions: . sodium chloride 10 mL/hr at 10/12/15 0800   PRN Meds:.sodium chloride, acetaminophen, ipratropium-albuterol, nitroGLYCERIN, ondansetron (ZOFRAN) IV, sodium chloride flush, sodium chloride flush    Filed Vitals:   10/14/15 2100 10/14/15 2101 10/15/15 0019 10/15/15 0630  BP: 103/47 108/37 92/63 98/54   Pulse:    46  Temp:    97.3 F (36.3 C)  TempSrc:    Oral  Resp:    16  Height:      Weight:    148 lb (67.132 kg)  SpO2:    100%    Intake/Output Summary (Last 24 hours) at 10/15/15 1001 Last data filed at 10/15/15 0900  Gross per 24 hour  Intake    580 ml  Output      0 ml  Net    580 ml    LABS: Basic Metabolic Panel:  Recent Labs  10/14/15 1220 10/15/15 0500  NA 144 145  K 3.7 3.6  CL 109 109  CO2 26 25  GLUCOSE 193* 131*  BUN 34* 34*  CREATININE 1.36* 1.53*    CALCIUM 8.7* 8.6*   Liver Function Tests: No results for input(s): AST, ALT, ALKPHOS, BILITOT, PROT, ALBUMIN in the last 72 hours. No results for input(s): LIPASE, AMYLASE in the last 72 hours. CBC:  Recent Labs  10/14/15 0400 10/15/15 0500  WBC 12.6* 13.3*  HGB 11.1* 10.0*  HCT 34.9* 31.5*  MCV 98.6 98.1  PLT 500* 465*   Cardiac Enzymes: No results for input(s): CKTOTAL, CKMB, CKMBINDEX, TROPONINI in the last 72 hours. BNP: Invalid input(s): POCBNP D-Dimer: No results for input(s): DDIMER in the last 72 hours. Hemoglobin A1C: No results for input(s): HGBA1C in the last 72 hours. Fasting Lipid Panel: No results for input(s): CHOL, HDL, LDLCALC, TRIG, CHOLHDL, LDLDIRECT in the last 72 hours. Thyroid Function Tests: No results for input(s): TSH, T4TOTAL, T3FREE, THYROIDAB in the last 72 hours.  Invalid input(s): FREET3 Anemia Panel: No results for input(s): VITAMINB12, FOLATE, FERRITIN, TIBC, IRON, RETICCTPCT in the last 72 hours.  RADIOLOGY: Dg Chest 2 View  10/03/2015  CLINICAL DATA:  Fall today and generalized weakness EXAM: CHEST  2 VIEW COMPARISON:  11/28/2013 FINDINGS:  Cardiac shadow is mildly enlarged but stable. Aortic calcifications are again seen. The lungs are well aerated bilaterally. Mild interstitial changes are noted within both lungs. No focal confluent infiltrate is seen. No acute bony abnormality is noted. IMPRESSION: No acute abnormality noted. Electronically Signed   By: Inez Catalina M.D.   On: 10/03/2015 18:59   Ct Head Wo Contrast  10/08/2015  CLINICAL DATA:  Confusion.  History of hypertension and diabetes. EXAM: CT HEAD WITHOUT CONTRAST TECHNIQUE: Contiguous axial images were obtained from the base of the skull through the vertex without intravenous contrast. COMPARISON:  None. FINDINGS: No evidence for acute infarction, hemorrhage, mass lesion, hydrocephalus, or extra-axial fluid. Advanced cerebral and cerebellar atrophy. Extensive hypoattenuation of  white matter, likely chronic microvascular ischemic change. Calvarium is intact. There is been previous ocular banding surgery. There is calcification of the skull base internal carotid arteries and distal BILATERAL vertebral arteries. Minor chronic sinus disease. No mastoid fluid. IMPRESSION: Atrophy and small vessel disease.  No acute intracranial findings. Electronically Signed   By: Staci Righter M.D.   On: 10/08/2015 14:59   Dg Chest Port 1 View  10/14/2015  CLINICAL DATA:  Multiple episodes of productive cough and dyspnea EXAM: PORTABLE CHEST 1 VIEW COMPARISON:  10/08/2015 FINDINGS: Cardiomegaly again noted. Atherosclerotic calcifications of thoracic aorta. Right arm PICC line is unchanged in position. Persistent mild perihilar and mild infrahilar interstitial prominence without convincing pulmonary edema. No segmental infiltrates. No pleural effusion. IMPRESSION: Right arm PICC line is unchanged in position. Persistent mild perihilar and mild infrahilar interstitial prominence without convincing pulmonary edema. No segmental infiltrates. No pleural effusion. Electronically Signed   By: Lahoma Crocker M.D.   On: 10/14/2015 12:36   Dg Chest Port 1 View  10/08/2015  CLINICAL DATA:  Confusion EXAM: PORTABLE CHEST 1 VIEW COMPARISON:  Portable chest x-ray of 10/06/2015 FINDINGS: The previously noted interstitial opacities primarily throughout the right lung have cleared. There is mild basilar volume loss right greater than left. Cardiomegaly is stable. The right PICC line tip overlies the lower SVC. No pneumothorax is seen. IMPRESSION: 1. Marked improvement in interstitial opacities particularly throughout the right lung. 2. Stable cardiomegaly with mild basilar volume loss. Electronically Signed   By: Ivar Drape M.D.   On: 10/08/2015 08:15   Dg Chest Port 1 View  10/06/2015  CLINICAL DATA:  CHF, STEMI, cardiogenic shock. EXAM: PORTABLE CHEST 1 VIEW COMPARISON:  Portable chest x-ray of October 05, 2015  FINDINGS: The lungs are adequately inflated. The pulmonary interstitial markings remain increased. The pulmonary vascularity remains engorged. The cardiac silhouette remains enlarged. There is no pleural effusion or pneumothorax. The right-sided PICC line tip projects over the distal third of the SVC. IMPRESSION: Persistent pulmonary interstitial edema little changed from yesterday's study. Cardiomegaly with pulmonary vascular congestion, stable. Electronically Signed   By: David  Martinique M.D.   On: 10/06/2015 07:27   Dg Chest Port 1 View  10/05/2015  CLINICAL DATA:  New PICC Line EXAM: PORTABLE CHEST 1 VIEW COMPARISON:  10/05/2015 FINDINGS: Interval placement RIGHT PICC line with tip in distal SVC. Stable cardiac silhouette. Mild interstitial/pulmonary edema similar. No pneumothorax. IMPRESSION: 1. RIGHT PICC line in good position. 2. Mild interstitial / pulmonary edema. Electronically Signed   By: Suzy Bouchard M.D.   On: 10/05/2015 10:01   Dg Chest Port 1 View  10/05/2015  CLINICAL DATA:  CHF, STEMI, cardiogenic shock. EXAM: PORTABLE CHEST 1 VIEW COMPARISON:  Portable chest x-ray dated October 04, 2015 FINDINGS: The  lungs are well-expanded. The pulmonary interstitial markings are less conspicuous today. The pulmonary vascularity is more distinct and less engorged. The cardiac silhouette remains enlarged. There is no significant pleural effusion. The retrocardiac region on the left is more dense however. There is calcification in the wall of the ascending aorta and aortic arch. IMPRESSION: Improved appearance of the pulmonary interstitium and pulmonary vascularity consistent with decreased CHF. Developing left lower lobe atelectasis. There is no pleural effusion or pneumothorax. Electronically Signed   By: David  Martinique M.D.   On: 10/05/2015 07:32   Portable Chest X-ray 1 View  10/04/2015  CLINICAL DATA:  Acute onset of shortness of breath. Initial encounter. EXAM: PORTABLE CHEST 1 VIEW COMPARISON:   Chest radiograph performed 10/03/2015 FINDINGS: The lungs are well-aerated. Vascular congestion is noted. Increased interstitial markings raise concern for pulmonary edema. There is no evidence of pleural effusion or pneumothorax. The cardiomediastinal silhouette is mildly enlarged. No acute osseous abnormalities are seen. An intra-aortic balloon pump tip is noted 2 cm below the aortic knob, as expected. IMPRESSION: Vascular congestion and mild cardiomegaly. Increased interstitial markings raise concern for pulmonary edema, new from the prior study. Electronically Signed   By: Garald Balding M.D.   On: 10/04/2015 00:44    PHYSICAL EXAM General: NAD. In bed  Neck: JVP 7-8 cm. No thyromegaly or thyroid nodule.  Lungs: Lungs diminished.  CV: Heart regular S1/S2, no S3/S4, no murmur.  No peripheral edema.   Abdomen: Soft, nontender, no hepatosplenomegaly, no distention.  Neurologic: Alert and oriented x3  no confusion this morning Psych: Confused. Oriented to self. Normal affect. Extremities: No clubbing or cyanosis.  R groin site ok. Dressing intact   TELEMETRY: NSR 60s.   ASSESSMENT AND PLAN: 82 yo with history of suspected prior silent MI was admitted with lateral STEMI/cardiogenic shock.  He had PCI to LCx/OM and IABP placement.  1. CAD: Lateral STEMI. LHC with culprit 99% LCx lesion, but also chronic occlusion of the LAD and 75% pRCA stenosis.  Now s/p DES to LCx/OM.   - Continue ASA 81 and ticagrelor.  - Atorvastatin 80 daily.  2. Acute systolic CHF/cardiogenic shock: IABP out 2/14 and norepinephrine off 2/15.  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 Off milrinone - Continue lasix 20 mg daily. Volume status seems ok.  - Continue lisinopril 2.5 mg daily, may have to hold if creatinine goes up again.   - BP too soft for b-blocker  - Dig level 0.3. Continue dig 0.125 mg daily.  3. Atrial fibrillation: He had atrial fibrillation earlier in stay. Given need for antiplatelet agents post procedure  and age, would avoid long-term anticoagulation. Now on amiodarone to maintain NSR.  Bradycardic over night.  Continue amio to 200 mg daily.    4. CKD stage 3: Creatinine 1.53.   5. Delirium: Waxing and waning confusion. Today he is oriented.  UA ok. CXR ok.    6. ID: Sepsis due to UTI and R groin abscess (s/p I&D 2/17). - Found to have sepsis in setting UTI and R groin abscess.  - Wound cx with Group A strep. Off vanc. Stop zosyn. Continue keflex 500 mg every 8 hours x  6 days.   - Change wound packing with iodoform daily.  - Today WBC trending up a little 10>12>13. Afebrile. CXR today. UA ok.  7. Deconditioning - Continue PT/OT. He will need SNF at discharge.  8. Dyspnea: Continue duonebs q6. Add mucinex.  9. Suspected OSA.?   Anticipate D/C to  SNF in am.    Amy Clegg NP-C  10/15/2015  10:01 AM   Patient seen with NP, agree with the above note.  Much more clear today.  Needs PT.  No changes to current therapy, plan discharge to SNF tomorrow.   Bradycardia overnight associated with apnea.  Will need to have formal sleep study as may benefit from CPAP.   Loralie Champagne 10/15/2015 10:50 AM

## 2015-10-15 NOTE — Progress Notes (Signed)
Pt heart rate dropped in the 20's unsustained with periods of apea. Vs obtained see flowsheet. Paged Dr. Clayborne Artist made him aware. No new orders. Monitoring will continue.

## 2015-10-16 ENCOUNTER — Encounter (HOSPITAL_COMMUNITY)
Admission: EM | Disposition: A | Discharge status: Skilled Nursing Facility | Payer: Self-pay | Source: Home / Self Care | Attending: Cardiology

## 2015-10-16 DIAGNOSIS — I441 Atrioventricular block, second degree: Secondary | ICD-10-CM

## 2015-10-16 DIAGNOSIS — I442 Atrioventricular block, complete: Secondary | ICD-10-CM

## 2015-10-16 HISTORY — PX: EP IMPLANTABLE DEVICE: SHX172B

## 2015-10-16 LAB — BASIC METABOLIC PANEL
ANION GAP: 7 (ref 5–15)
BUN: 33 mg/dL — AB (ref 4–21)
BUN: 33 mg/dL — AB (ref 6–20)
CALCIUM: 8.5 mg/dL — AB (ref 8.9–10.3)
CO2: 26 mmol/L (ref 22–32)
Chloride: 109 mmol/L (ref 101–111)
Creatinine, Ser: 1.38 mg/dL — ABNORMAL HIGH (ref 0.61–1.24)
Creatinine: 1.4 mg/dL — AB (ref 0.6–1.3)
GFR calc Af Amer: 50 mL/min — ABNORMAL LOW (ref >60)
GFR, EST NON AFRICAN AMERICAN: 43 mL/min — AB (ref >60)
GLUCOSE: 131 mg/dL
GLUCOSE: 131 mg/dL — AB (ref 65–99)
Potassium: 3.4 mmol/L — ABNORMAL LOW (ref 3.5–5.1)
SODIUM: 142 mmol/L (ref 137–147)
SODIUM: 142 mmol/L (ref 135–145)

## 2015-10-16 LAB — GLUCOSE, CAPILLARY
GLUCOSE-CAPILLARY: 125 mg/dL — AB (ref 65–99)
GLUCOSE-CAPILLARY: 178 mg/dL — AB (ref 65–99)
GLUCOSE-CAPILLARY: 108 mg/dL — AB (ref 65–99)
Glucose-Capillary: 128 mg/dL — ABNORMAL HIGH (ref 65–99)

## 2015-10-16 LAB — CBC
HCT: 32.6 % — ABNORMAL LOW (ref 39.0–52.0)
Hemoglobin: 10.5 g/dL — ABNORMAL LOW (ref 13.0–17.0)
MCH: 32.4 pg (ref 26.0–34.0)
MCHC: 32.2 g/dL (ref 30.0–36.0)
MCV: 100.6 fL — ABNORMAL HIGH (ref 78.0–100.0)
Platelets: 479 10*3/uL — ABNORMAL HIGH (ref 150–400)
RBC: 3.24 MIL/uL — ABNORMAL LOW (ref 4.22–5.81)
RDW: 13.7 % (ref 11.5–15.5)
WBC: 11.1 10*3/uL — ABNORMAL HIGH (ref 4.0–10.5)

## 2015-10-16 LAB — CBC AND DIFFERENTIAL: WBC: 11.1 10^3/mL

## 2015-10-16 SURGERY — PACEMAKER IMPLANT
Anesthesia: LOCAL

## 2015-10-16 MED ORDER — POTASSIUM CHLORIDE CRYS ER 20 MEQ PO TBCR: 20.0000 meq | EXTENDED_RELEASE_TABLET | Freq: Every day | ORAL | Status: DC

## 2015-10-16 MED ORDER — IPRATROPIUM-ALBUTEROL 0.5-2.5 (3) MG/3ML IN SOLN: 3.0000 mL | Freq: Four times a day (QID) | RESPIRATORY_TRACT | Status: DC | PRN

## 2015-10-16 MED ORDER — CEPHALEXIN 500 MG PO CAPS: 500.0000 mg | ORAL_CAPSULE | Freq: Three times a day (TID) | ORAL | Status: AC

## 2015-10-16 MED ORDER — SODIUM CHLORIDE 0.9 % IV SOLN: 250.0000 mL | INTRAVENOUS | Status: DC | PRN

## 2015-10-16 MED ORDER — CEPHALEXIN 500 MG PO CAPS
500.0000 mg | ORAL_CAPSULE | Freq: Three times a day (TID) | ORAL | Status: AC
  Administered 2015-10-17 – 2015-10-19 (×8): 500 mg via ORAL
  Filled 2015-10-16 (×8): qty 1

## 2015-10-16 MED ORDER — ACETAMINOPHEN 325 MG PO TABS: 325.0000 mg | ORAL_TABLET | ORAL | Status: DC | PRN

## 2015-10-16 MED ORDER — POTASSIUM CHLORIDE CRYS ER 20 MEQ PO TBCR
20.0000 meq | EXTENDED_RELEASE_TABLET | Freq: Every day | ORAL | Status: DC
  Administered 2015-10-16 – 2015-10-19 (×4): 20 meq via ORAL
  Filled 2015-10-16 (×5): qty 1

## 2015-10-16 MED ORDER — BACITRACIN-NEOMYCIN-POLYMYXIN OINTMENT TUBE
TOPICAL_OINTMENT | CUTANEOUS | Status: DC | PRN
  Filled 2015-10-16: qty 15

## 2015-10-16 MED ORDER — SODIUM CHLORIDE 0.9 % IR SOLN
80.0000 mg | Status: AC
  Administered 2015-10-16: 80 mg
  Filled 2015-10-16: qty 2

## 2015-10-16 MED ORDER — CEFAZOLIN SODIUM 1-5 GM-% IV SOLN
1.0000 g | Freq: Four times a day (QID) | INTRAVENOUS | Status: AC
Start: 2015-10-16 — End: 2015-10-17
  Administered 2015-10-16 – 2015-10-17 (×3): 1 g via INTRAVENOUS
  Filled 2015-10-16 (×3): qty 50

## 2015-10-16 MED ORDER — LIDOCAINE HCL (PF) 1 % IJ SOLN
INTRAMUSCULAR | Status: AC
  Filled 2015-10-16: qty 60

## 2015-10-16 MED ORDER — NITROGLYCERIN 0.4 MG SL SUBL: 0.4000 mg | SUBLINGUAL_TABLET | SUBLINGUAL | Status: AC | PRN

## 2015-10-16 MED ORDER — SODIUM CHLORIDE 0.9% FLUSH
3.0000 mL | Freq: Two times a day (BID) | INTRAVENOUS | Status: DC
  Administered 2015-10-16 – 2015-10-19 (×6): 3 mL via INTRAVENOUS

## 2015-10-16 MED ORDER — HYDROCODONE-ACETAMINOPHEN 5-325 MG PO TABS: 1.0000 | ORAL_TABLET | ORAL | Status: DC | PRN

## 2015-10-16 MED ORDER — ONDANSETRON HCL 4 MG/2ML IJ SOLN: 4.0000 mg | Freq: Four times a day (QID) | INTRAMUSCULAR | Status: DC | PRN

## 2015-10-16 MED ORDER — CEFAZOLIN SODIUM-DEXTROSE 2-3 GM-% IV SOLR
2.0000 g | INTRAVENOUS | Status: AC
  Administered 2015-10-16: 2 g via INTRAVENOUS

## 2015-10-16 MED ORDER — CEFAZOLIN SODIUM-DEXTROSE 2-3 GM-% IV SOLR
INTRAVENOUS | Status: AC
  Filled 2015-10-16: qty 50

## 2015-10-16 MED ORDER — ACETAMINOPHEN 325 MG PO TABS: 650.0000 mg | ORAL_TABLET | Freq: Four times a day (QID) | ORAL | Status: AC | PRN

## 2015-10-16 MED ORDER — ASPIRIN 81 MG PO TBEC: 81.0000 mg | DELAYED_RELEASE_TABLET | Freq: Every day | ORAL | Status: AC

## 2015-10-16 MED ORDER — AMIODARONE HCL 200 MG PO TABS: 200.0000 mg | ORAL_TABLET | Freq: Every day | ORAL | Status: DC

## 2015-10-16 MED ORDER — SODIUM CHLORIDE 0.9 % IR SOLN
Status: AC
  Filled 2015-10-16: qty 2

## 2015-10-16 MED ORDER — SODIUM CHLORIDE 0.9 % IV SOLN: INTRAVENOUS | Status: DC

## 2015-10-16 MED ORDER — POTASSIUM CHLORIDE CRYS ER 20 MEQ PO TBCR
40.0000 meq | EXTENDED_RELEASE_TABLET | Freq: Once | ORAL | Status: AC
  Administered 2015-10-16: 40 meq via ORAL
  Filled 2015-10-16: qty 2

## 2015-10-16 MED ORDER — DIGOXIN 125 MCG PO TABS: 0.1250 mg | ORAL_TABLET | Freq: Every day | ORAL | Status: DC

## 2015-10-16 MED ORDER — IOHEXOL 350 MG/ML SOLN
INTRAVENOUS | Status: DC | PRN
  Administered 2015-10-16: 15 mL via INTRAVENOUS

## 2015-10-16 MED ORDER — HEPARIN (PORCINE) IN NACL 2-0.9 UNIT/ML-% IJ SOLN
INTRAMUSCULAR | Status: DC | PRN
  Administered 2015-10-16: 15:00:00

## 2015-10-16 MED ORDER — FUROSEMIDE 20 MG PO TABS: 20.0000 mg | ORAL_TABLET | Freq: Every day | ORAL | Status: DC

## 2015-10-16 MED ORDER — LIDOCAINE HCL (PF) 1 % IJ SOLN
INTRAMUSCULAR | Status: DC | PRN
  Administered 2015-10-16: 43 mL via INTRADERMAL

## 2015-10-16 MED ORDER — TICAGRELOR 90 MG PO TABS: 90.0000 mg | ORAL_TABLET | Freq: Two times a day (BID) | ORAL | Status: AC

## 2015-10-16 MED ORDER — LISINOPRIL 2.5 MG PO TABS: 2.5000 mg | ORAL_TABLET | Freq: Every day | ORAL | Status: DC

## 2015-10-16 MED ORDER — SODIUM CHLORIDE 0.9% FLUSH
3.0000 mL | INTRAVENOUS | Status: DC | PRN
  Administered 2015-10-19: 3 mL via INTRAVENOUS
  Filled 2015-10-16: qty 3

## 2015-10-16 MED ORDER — ATORVASTATIN CALCIUM 80 MG PO TABS: 80.0000 mg | ORAL_TABLET | Freq: Every day | ORAL | Status: AC

## 2015-10-16 MED ORDER — SODIUM CHLORIDE 0.45 % IV SOLN: INTRAVENOUS | Status: DC

## 2015-10-16 SURGICAL SUPPLY — 7 items
CABLE SURGICAL S-101-97-12 (CABLE) ×1 IMPLANT
LEAD TENDRIL SDX 2088TC-52CM (Lead) ×1 IMPLANT
LEAD TENDRIL SDX 2088TC-58CM (Lead) ×1 IMPLANT
PAD DEFIB LIFELINK (PAD) ×1 IMPLANT
PPM ASSURITY DR PM2240 (Pacemaker) ×1 IMPLANT
SHEATH CLASSIC 7F (SHEATH) ×2 IMPLANT
TRAY PACEMAKER INSERTION (PACKS) ×1 IMPLANT

## 2015-10-16 NOTE — Interval H&P Note (Signed)
History and Physical Interval Note:  10/16/2015 2:45 PM  Chad French  has presented today for surgery, with the diagnosis of asystole  The various methods of treatment have been discussed with the patient and family. After consideration of risks, benefits and other options for treatment, the patient has consented to  Procedure(s): Pacemaker Implant (N/A) as a surgical intervention .  The patient's history has been reviewed, patient examined, no change in status, stable for surgery.  I have reviewed the patient's chart and labs.  Questions were answered to the patient's satisfaction.     Chad French

## 2015-10-16 NOTE — Discharge Summary (Deleted)
Advanced Heart Failure Discharge Note   Discharge Summary   Patient ID: Chad French MRN: [id number], DOB/AGE: Dec 10, 1921 82 y.o. Admit date: 10/03/2015 D/C date:     10/16/2015   Primary Discharge Diagnoses:  1. CAD: Lateral STEMI. LHC with culprit 99% LCx lesion, but also chronic occlusion of the LAD and 75% pRCA stenosis. s/p DES to LCX/OM 2. Acute systolic CHF/cardiogenic shock: IABP out 2/14 and norepinephrine off 2/15. 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 Off milrinone 3. Atrial fibrillation 4. CKD stage 3 5. Delirium 6. ID: Sepsis due to UTI and R groin abscess (s/p I&D 2/17). - Wound cx with Group A strep. Continue keflex 500 mg every 8 hours x 6 days.  - Change wound packing with iodoform daily.  7. Deconditioning - To SNF 8. Dyspnea - Stable. Prn nebs.  9. Suspected OSA   Hospital Course:  Chad French is a 82 y.o. male with history of suspected prior silent MI admitted 10/03/15 with lateral STEMI and cardiogenic shock. He had PCI with DES LCx/OM and decompensated during catheterization prompting placement of IABP. LVEDP 30 on cath, also treated for volume overload with IV lasix.   Pt did briefly go into afib 10/05/15, thought to be 2/2 milrinone/norepi. Spontaneously converted.  Placed on 200 mg amiodoraone BID to maintain NSR while on pressors.   Pt gradually improved and drips gradually titrated off. IABP removed 2/14 and norepinephrine stopped 2/15. Milrinone stopped 2/17 with stable mixed venous sats.  Hospital course complicated by suspected sepsis with fever to 101.6 and increased white count on 2/17. Noted to have developed R groin abscess at IABP site.  Placed on vanc/zosyn empirically. Narrowed to Cephalexin with sensitivities. S/p I/D 2/17 with wound packing with iodoform guaze dressing. Wound culture was positive for Group A Strep.   Additional complications include intermittent delirium.  Did not seem to be sundowning or directly related to  his sepsis above.  He was more stable on days his family visited. Thought to be mostly due to hospital/ICU delirium.   He was noted to have episodes of apnea overnight on telemetry. Suspect sleep apnea and will make arrangements for a sleep study once we see as outpatient.   To continue cephalexin 500 mg TID until 10/19/15. Will need daily dressing changes/wound checks of right groin with daily replacement of iodoform guaze until healed.  I/Os inaccurate. Weight down 18 lbs from highest weight this admission.   Pt will be discharged to SNF in stable condition.  Spoke with Nursing Supervisor and scheduled him for BMET 10/20/15 with results to be faxed to [phone] c/o Dr. Aundra Dubin. He has close follow up in the HF clinic as below.   Discharge Weight: 149 lbs Discharge Vitals: Blood pressure 118/49, pulse 59, temperature 97.8 F (36.6 C), temperature source Oral, resp. rate 20, height 5\' 7"  (1.702 m), weight 149 lb (67.586 kg), SpO2 97 %.  Labs: Lab Results  Component Value Date   WBC 11.1* 10/16/2015   HGB 10.5* 10/16/2015   HCT 32.6* 10/16/2015   MCV 100.6* 10/16/2015   PLT 479* 10/16/2015     Recent Labs Lab 10/16/15 0415  NA 142  K 3.4*  CL 109  CO2 26  BUN 33*  CREATININE 1.38*  CALCIUM 8.5*  GLUCOSE 131*   Lab Results  Component Value Date   CHOL 151 10/04/2015   HDL 39* 10/04/2015   LDLCALC 95 10/04/2015   TRIG 84 10/04/2015   BNP (last 3 results) No results for  input(s): BNP in the last 8760 hours.  ProBNP (last 3 results) No results for input(s): PROBNP in the last 8760 hours.   Diagnostic Studies/Procedures   No results found.  Discharge Medications     Medication List    TAKE these medications        acetaminophen 325 MG tablet  Commonly known as:  TYLENOL  Take 2 tablets (650 mg total) by mouth every 6 (six) hours as needed for mild pain or headache.     amiodarone 200 MG tablet  Commonly known as:  PACERONE  Take 1 tablet (200 mg total) by  mouth daily.     aspirin 81 MG EC tablet  Take 1 tablet (81 mg total) by mouth daily.     atorvastatin 80 MG tablet  Commonly known as:  LIPITOR  Take 1 tablet (80 mg total) by mouth daily at 6 PM.     cephALEXin 500 MG capsule  Commonly known as:  KEFLEX  Take 1 capsule (500 mg total) by mouth 3 (three) times daily.     cholecalciferol 400 units Tabs tablet  Commonly known as:  VITAMIN D  Take 400 Units by mouth daily.     digoxin 0.125 MG tablet  Commonly known as:  LANOXIN  Take 1 tablet (0.125 mg total) by mouth daily.     furosemide 20 MG tablet  Commonly known as:  LASIX  Take 1 tablet (20 mg total) by mouth daily.     ipratropium-albuterol 0.5-2.5 (3) MG/3ML Soln  Commonly known as:  DUONEB  Take 3 mLs by nebulization every 6 (six) hours as needed.     lisinopril 2.5 MG tablet  Commonly known as:  PRINIVIL,ZESTRIL  Take 1 tablet (2.5 mg total) by mouth daily.     nitroGLYCERIN 0.4 MG SL tablet  Commonly known as:  NITROSTAT  Place 1 tablet (0.4 mg total) under the tongue every 5 (five) minutes x 3 doses as needed for chest pain.     potassium chloride SA 20 MEQ tablet  Commonly known as:  K-DUR,KLOR-CON  Take 1 tablet (20 mEq total) by mouth daily.     ticagrelor 90 MG Tabs tablet  Commonly known as:  BRILINTA  Take 1 tablet (90 mg total) by mouth 2 (two) times daily.        Disposition   The patient will be discharged in stable condition to SNF.  Discharge Instructions    Diet - low sodium heart healthy    Complete by:  As directed      Heart Failure patients record your daily weight using the same scale at the same time of day    Complete by:  As directed      Increase activity slowly    Complete by:  As directed           Follow-up Information    Follow up with Loralie Champagne, MD On 10/26/2015.   Specialty:  Cardiology   Why:  at 1030 for post hospital follow up. Please bring all of your medications to your visit. The code for patient parking is  0010.   Contact information:   [address] [place] [address] [phone]         Duration of Discharge Encounter: Greater than 35 minutes   Signed, Shirley Friar PA-C 10/16/2015, 12:10 PM

## 2015-10-16 NOTE — Progress Notes (Signed)
Patient ID: Chad French, male   DOB: [birth year], 82 y.o.   MRN: [id number]   SUBJECTIVE:  IABP out 2/14 and norepinephrine off 2/15. Milrinone stopped 2/17. On 2/17 concern for impending sepsis with fever to 101.6.  R groin abscess I&D. Vanc & zosyn started.   Bcx - NGTD Ucx - multiple organisms. Likely contaminant Wound - abundant Group A strep   Creatinine stable. Continues to have episodes of bradycardia Asymptomatic. Breathing "steady". Denies CP, lightheadedness, or dizziness. Ready to go.    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   Scheduled Meds: . amiodarone  200 mg Oral Daily  . antiseptic oral rinse  7 mL Mouth Rinse BID  . aspirin EC  81 mg Oral Daily  . atorvastatin  80 mg Oral q1800  . cephALEXin  500 mg Oral 3 times per day  . digoxin  0.125 mg Oral Daily  . feeding supplement (GLUCERNA SHAKE)  237 mL Oral TID BM  . furosemide  20 mg Oral Daily  . heparin subcutaneous  5,000 Units Subcutaneous 3 times per day  . insulin aspart  0-15 Units Subcutaneous TID WC  . lisinopril  2.5 mg Oral Daily  . sodium chloride flush  10-40 mL Intracatheter Q12H  . sodium chloride flush  3 mL Intravenous Q12H  . ticagrelor  90 mg Oral BID   Continuous Infusions: . sodium chloride 10 mL/hr at 10/12/15 0800   PRN Meds:.sodium chloride, acetaminophen, ipratropium-albuterol, nitroGLYCERIN, ondansetron (ZOFRAN) IV, sodium chloride flush, sodium chloride flush    Filed Vitals:   10/15/15 1027 10/15/15 1313 10/15/15 2124 10/16/15 0534  BP: 96/46 100/57 102/51 110/61  Pulse:  97 57 80  Temp:  99.5 F (37.5 C) 97.6 F (36.4 C) 97.7 F (36.5 C)  TempSrc:  Oral Oral Oral  Resp:  18 18 18   Height:      Weight:    149 lb (67.586 kg)  SpO2:  98% 98% 96%    Intake/Output Summary (Last 24 hours) at 10/16/15 0758 Last data filed at 10/15/15 1858  Gross per 24 hour  Intake    600 ml  Output      0 ml  Net    600  ml    LABS: Basic Metabolic Panel:  Recent Labs  10/15/15 0500 10/16/15 0415  NA 145 142  K 3.6 3.4*  CL 109 109  CO2 25 26  GLUCOSE 131* 131*  BUN 34* 33*  CREATININE 1.53* 1.38*  CALCIUM 8.6* 8.5*   Liver Function Tests: No results for input(s): AST, ALT, ALKPHOS, BILITOT, PROT, ALBUMIN in the last 72 hours. No results for input(s): LIPASE, AMYLASE in the last 72 hours. CBC:  Recent Labs  10/15/15 0500 10/16/15 0415  WBC 13.3* 11.1*  HGB 10.0* 10.5*  HCT 31.5* 32.6*  MCV 98.1 100.6*  PLT 465* 479*   Cardiac Enzymes: No results for input(s): CKTOTAL, CKMB, CKMBINDEX, TROPONINI in the last 72 hours. BNP: Invalid input(s): POCBNP D-Dimer: No results for input(s): DDIMER in the last 72 hours. Hemoglobin A1C: No results for input(s): HGBA1C in the last 72 hours. Fasting Lipid Panel: No results for input(s): CHOL, HDL, LDLCALC, TRIG, CHOLHDL, LDLDIRECT in the last 72 hours. Thyroid Function Tests: No results for input(s): TSH, T4TOTAL, T3FREE, THYROIDAB in the last 72 hours.  Invalid input(s): FREET3 Anemia Panel: No results for input(s): VITAMINB12, FOLATE, FERRITIN, TIBC, IRON, RETICCTPCT in the last 72 hours.  RADIOLOGY: Dg  Chest 2 View  10/03/2015  CLINICAL DATA:  Fall today and generalized weakness EXAM: CHEST  2 VIEW COMPARISON:  11/28/2013 FINDINGS: Cardiac shadow is mildly enlarged but stable. Aortic calcifications are again seen. The lungs are well aerated bilaterally. Mild interstitial changes are noted within both lungs. No focal confluent infiltrate is seen. No acute bony abnormality is noted. IMPRESSION: No acute abnormality noted. Electronically Signed   By: Inez Catalina M.D.   On: 10/03/2015 18:59   Ct Head Wo Contrast  10/08/2015  CLINICAL DATA:  Confusion.  History of hypertension and diabetes. EXAM: CT HEAD WITHOUT CONTRAST TECHNIQUE: Contiguous axial images were obtained from the base of the skull through the vertex without intravenous contrast.  COMPARISON:  None. FINDINGS: No evidence for acute infarction, hemorrhage, mass lesion, hydrocephalus, or extra-axial fluid. Advanced cerebral and cerebellar atrophy. Extensive hypoattenuation of white matter, likely chronic microvascular ischemic change. Calvarium is intact. There is been previous ocular banding surgery. There is calcification of the skull base internal carotid arteries and distal BILATERAL vertebral arteries. Minor chronic sinus disease. No mastoid fluid. IMPRESSION: Atrophy and small vessel disease.  No acute intracranial findings. Electronically Signed   By: Staci Righter M.D.   On: 10/08/2015 14:59   Dg Chest Port 1 View  10/14/2015  CLINICAL DATA:  Multiple episodes of productive cough and dyspnea EXAM: PORTABLE CHEST 1 VIEW COMPARISON:  10/08/2015 FINDINGS: Cardiomegaly again noted. Atherosclerotic calcifications of thoracic aorta. Right arm PICC line is unchanged in position. Persistent mild perihilar and mild infrahilar interstitial prominence without convincing pulmonary edema. No segmental infiltrates. No pleural effusion. IMPRESSION: Right arm PICC line is unchanged in position. Persistent mild perihilar and mild infrahilar interstitial prominence without convincing pulmonary edema. No segmental infiltrates. No pleural effusion. Electronically Signed   By: Lahoma Crocker M.D.   On: 10/14/2015 12:36   Dg Chest Port 1 View  10/08/2015  CLINICAL DATA:  Confusion EXAM: PORTABLE CHEST 1 VIEW COMPARISON:  Portable chest x-ray of 10/06/2015 FINDINGS: The previously noted interstitial opacities primarily throughout the right lung have cleared. There is mild basilar volume loss right greater than left. Cardiomegaly is stable. The right PICC line tip overlies the lower SVC. No pneumothorax is seen. IMPRESSION: 1. Marked improvement in interstitial opacities particularly throughout the right lung. 2. Stable cardiomegaly with mild basilar volume loss. Electronically Signed   By: Ivar Drape M.D.    On: 10/08/2015 08:15   Dg Chest Port 1 View  10/06/2015  CLINICAL DATA:  CHF, STEMI, cardiogenic shock. EXAM: PORTABLE CHEST 1 VIEW COMPARISON:  Portable chest x-ray of October 05, 2015 FINDINGS: The lungs are adequately inflated. The pulmonary interstitial markings remain increased. The pulmonary vascularity remains engorged. The cardiac silhouette remains enlarged. There is no pleural effusion or pneumothorax. The right-sided PICC line tip projects over the distal third of the SVC. IMPRESSION: Persistent pulmonary interstitial edema little changed from yesterday's study. Cardiomegaly with pulmonary vascular congestion, stable. Electronically Signed   By: David  Martinique M.D.   On: 10/06/2015 07:27   Dg Chest Port 1 View  10/05/2015  CLINICAL DATA:  New PICC Line EXAM: PORTABLE CHEST 1 VIEW COMPARISON:  10/05/2015 FINDINGS: Interval placement RIGHT PICC line with tip in distal SVC. Stable cardiac silhouette. Mild interstitial/pulmonary edema similar. No pneumothorax. IMPRESSION: 1. RIGHT PICC line in good position. 2. Mild interstitial / pulmonary edema. Electronically Signed   By: Suzy Bouchard M.D.   On: 10/05/2015 10:01   Dg Chest Port 1 View  10/05/2015  CLINICAL DATA:  CHF, STEMI, cardiogenic shock. EXAM: PORTABLE CHEST 1 VIEW COMPARISON:  Portable chest x-ray dated October 04, 2015 FINDINGS: The lungs are well-expanded. The pulmonary interstitial markings are less conspicuous today. The pulmonary vascularity is more distinct and less engorged. The cardiac silhouette remains enlarged. There is no significant pleural effusion. The retrocardiac region on the left is more dense however. There is calcification in the wall of the ascending aorta and aortic arch. IMPRESSION: Improved appearance of the pulmonary interstitium and pulmonary vascularity consistent with decreased CHF. Developing left lower lobe atelectasis. There is no pleural effusion or pneumothorax. Electronically Signed   By: David   Martinique M.D.   On: 10/05/2015 07:32   Portable Chest X-ray 1 View  10/04/2015  CLINICAL DATA:  Acute onset of shortness of breath. Initial encounter. EXAM: PORTABLE CHEST 1 VIEW COMPARISON:  Chest radiograph performed 10/03/2015 FINDINGS: The lungs are well-aerated. Vascular congestion is noted. Increased interstitial markings raise concern for pulmonary edema. There is no evidence of pleural effusion or pneumothorax. The cardiomediastinal silhouette is mildly enlarged. No acute osseous abnormalities are seen. An intra-aortic balloon pump tip is noted 2 cm below the aortic knob, as expected. IMPRESSION: Vascular congestion and mild cardiomegaly. Increased interstitial markings raise concern for pulmonary edema, new from the prior study. Electronically Signed   By: Garald Balding M.D.   On: 10/04/2015 00:44    PHYSICAL EXAM General: NAD. In bed  Neck: JVP 7-8 cm. No thyromegaly or thyroid nodule.  Lungs: Lungs diminished.  CV: Heart regular S1/S2, no S3/S4, no murmur.  No peripheral edema.   Abdomen: Soft, NT, ND, no HSM. No bruits or masses. +BS  Neurologic: Alert and oriented x3   Psych: Oriented to self. Normal affect. Extremities: No clubbing or cyanosis.  R groin site ok. Dressing intact   TELEMETRY: Reviewed personally, NSR 70s.  Occasionally brady into 40-50s.   ASSESSMENT AND PLAN: 82 yo with history of suspected prior silent MI was admitted with lateral STEMI/cardiogenic shock.  He had PCI to LCx/OM and IABP placement.  1. CAD: Lateral STEMI. LHC with culprit 99% LCx lesion, but also chronic occlusion of the LAD and 75% pRCA stenosis.  Now s/p DES to LCx/OM.   - Continue ASA 81 and ticagrelor.  - continue Atorvastatin 80 daily.  2. Acute systolic CHF/cardiogenic shock: IABP out 2/14 and norepinephrine off 2/15.  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 Off milrinone - Continue lasix 20 mg daily. Volume status seems ok. K 3.4. Will supp and add K 20 meq daily.  - Continue lisinopril 2.5  mg daily, may have to hold if creatinine goes up again.   - BP too soft for b-blocker, also still with some bradycardia over night.  - Dig level 0.3. Continue dig 0.125 mg daily.  3. Atrial fibrillation: He had atrial fibrillation earlier in stay. Given need for antiplatelet agents post procedure and age, would avoid long-term anticoagulation. Now on amiodarone to maintain NSR. Continue amio to 200 mg daily.    4. CKD stage 3: Creatinine 1.53.   5. Delirium: Waxing and waning confusion. Today he is oriented.  UA ok. CXR ok.    6. ID: Sepsis due to UTI and R groin abscess (s/p I&D 2/17). - Found to have sepsis in setting UTI and R groin abscess.  - Wound cx with Group A strep. Off vanc. Stop zosyn. Continue keflex 500 mg every 8 hours x  6 days.   - Change wound packing with iodoform daily.  -  WBC back down 10>12>13>11. Afebrile. CXR 10/15/15 no acute findings. UA ok.  7. Deconditioning - Worked with PT/OT in house.  To SNF on d/c 8. Dyspnea: Continue duonebs q6. Add mucinex.  9. Suspected OSA.?  - Will need formal sleep study as outpatient.   OK for d/c to SNF.    Shirley Friar PA-C 10/16/2015  7:58 AM   Advanced Heart Failure Team Pager [phone] (M-F; 7a - 4p)  Please contact [facility] for night-coverage after hours (4p -7a ) and weekends on [email or web address]   Patient seen with PA, agree with the above note.  Patient is clear/oriented today.  Afebrile, WBCs coming down.  He may go to SNF on current meds, will complete his course of cephalexin.  I will need to see him back in 10-14 days in CHF clinic.   Loralie Champagne 10/16/2015 11:24 AM

## 2015-10-16 NOTE — Progress Notes (Signed)
Pt taken emergently to cath lab for pacemaker placement family updated. Oren Beckmann, RN

## 2015-10-16 NOTE — Care Management Important Message (Signed)
Important Message  Patient Details  Name: Chad French MRN: [id number] Date of Birth: 10/15/1921   Medicare Important Message Given:  Yes    [name] May 10/16/2015, 11:56 AM

## 2015-10-16 NOTE — Consult Note (Signed)
ELECTROPHYSIOLOGY CONSULT NOTE    Primary Care Physician: Kandice Hams, MD Referring Physician:  Dr Aundra Dubin  Admit Date: 10/03/2015  Reason for consultation:  AV block  Chad French is a 82 y.o. male with a h/o a complicated health history (reviewed) s/p recent MI now with transient complete heart block and syncope.  He had a 20 second RR interval today with stokes adams events.  No reversible causes are found.  He is therefore brought urgently for PPM implant.  Today, he denies symptoms of palpitations, chest pain, shortness of breath, orthopnea, PND, lower extremity edema, dizziness, presyncope, syncope, or neurologic sequela. The patient is tolerating medications without difficulties and is otherwise without complaint today.   Past Medical History  Diagnosis Date  . Diverticulitis     with colonoscopy in 11/2010-Diverticula and polyp whioch was a non-malignant tubuular adenoma  . HTN (hypertension)   . HLD (hyperlipidemia)   . Degenerative disc disease   . Diabetes mellitus ([place])   . Glaucoma    Past Surgical History  Procedure Laterality Date  . Colonoscopy  4/12  . Cardiac catheterization N/A 10/03/2015    Procedure: Left Heart Cath and Coronary Angiography;  Surgeon: Peter M Martinique, MD;  Location: [facility];  Service: Cardiovascular;  Laterality: N/A;  . Cardiac catheterization N/A 10/03/2015    Procedure: IABP Insertion;  Surgeon: Peter M Martinique, MD;  Location: [facility] [facility];  Service: Cardiovascular;  Laterality: N/A;  . Cardiac catheterization N/A 10/03/2015    Procedure: Coronary Stent Intervention;  Surgeon: Peter M Martinique, MD;  Location: [facility];  Service: Cardiovascular;  Laterality: N/A;    . [MAR Hold] antiseptic oral rinse  7 mL Mouth Rinse BID  . [MAR Hold] aspirin EC  81 mg Oral Daily  . [MAR Hold] atorvastatin  80 mg Oral q1800  .  ceFAZolin (ANCEF) IV  2 g Intravenous On Call  . [MAR Hold] cephALEXin  500 mg Oral 3 times per  day  . [MAR Hold] digoxin  0.125 mg Oral Daily  . [MAR Hold] feeding supplement (GLUCERNA SHAKE)  237 mL Oral TID BM  . [MAR Hold] furosemide  20 mg Oral Daily  . gentamicin irrigation  80 mg Irrigation To Cath  . [MAR Hold] insulin aspart  0-15 Units Subcutaneous TID WC  . [MAR Hold] lisinopril  2.5 mg Oral Daily  . [MAR Hold] potassium chloride  20 mEq Oral Daily  . [MAR Hold] sodium chloride flush  10-40 mL Intracatheter Q12H  . [MAR Hold] sodium chloride flush  3 mL Intravenous Q12H  . [MAR Hold] ticagrelor  90 mg Oral BID   . sodium chloride    . sodium chloride 10 mL/hr at 10/12/15 0800  . sodium chloride      No Known Allergies  Social History   Social History  . Marital Status: Married    Spouse Name: N/A  . Number of Children: N/A  . Years of Education: N/A   Occupational History  . Not on file.   Social History Main Topics  . Smoking status: Former Smoker -- 1.00 packs/day for 5 years    Quit date: 08/23/1971  . Smokeless tobacco: Not on file  . Alcohol Use: No  . Drug Use: No  . Sexual Activity: Not Currently   Other Topics Concern  . Not on file   Social History Narrative   Lives with Wife   Daughter is HCPOA    Family History  Problem Relation Age of Onset  . Diabetes Mother   . Diabetes Maternal Grandmother   . Kidney disease Maternal Grandfather     ROS- due to urgency of situation, could not perform  Physical Exam:  Filed Vitals:   10/16/15 0534 10/16/15 1000 10/16/15 1238 10/16/15 1400  BP: 110/61 118/49 123/80 86/57  Pulse: 80 59 106 59  Temp: 97.7 F (36.5 C) 97.8 F (36.6 C)    TempSrc: Oral Oral    Resp: 18 20 18 12   Height:      Weight: 149 lb (67.586 kg)     SpO2: 96% 97% 91% 100%    GEN- The patient is elderly and frail appearing, lethargic Head- normocephalic, atraumatic Eyes-  Sclera clear, conjunctiva pink Ears- hearing intact Oropharynx- clear Neck- supple,   Lungs- labored breathing Heart- Regular rate and  rhythm  GI- soft, NT, ND, + BS Extremities- no clubbing, cyanosis,+ dependant edema MS- diffuse atrophy Skin- no rash or lesion Psych- euthymic mood, full affect Neuro- strength and sensation are intact  EKG reviewed  Labs:   Lab Results  Component Value Date   WBC 11.1* 10/16/2015   HGB 10.5* 10/16/2015   HCT 32.6* 10/16/2015   MCV 100.6* 10/16/2015   PLT 479* 10/16/2015    Recent Labs Lab 10/16/15 0415  NA 142  K 3.4*  CL 109  CO2 26  BUN 33*  CREATININE 1.38*  CALCIUM 8.5*  GLUCOSE 131*   Lab Results  Component Value Date   TROPONINI >65.00* 10/04/2015    Lab Results  Component Value Date   CHOL 151 10/04/2015   Lab Results  Component Value Date   HDL 39* 10/04/2015   Lab Results  Component Value Date   LDLCALC 95 10/04/2015   Lab Results  Component Value Date   TRIG 84 10/04/2015   Lab Results  Component Value Date   CHOLHDL 3.9 10/04/2015   No results found for: LDLDIRECT     Echo:  reviewed  ASSESSMENT AND PLAN:   1. Complete heart block The patient presents with transient complete heart block and frequent mobitz II second degree AV block.  No reversible causes are found.  I spoke for a long time with patient and daughter.    Risks, benefits, alternatives to pacemaker implantation were discussed in detail with the patient today. The patient and daughter understands that the risks include but are not limited to bleeding, infection, pneumothorax, perforation, tamponade, vascular damage, renal failure, MI, stroke, death,  and lead dislodgement and wishes to proceed.  We will proceed urgently with PPM at this time.  2. Atrial fibrillation Hold anticoagulation presently  3. Ischemic CM/ CAD Not a candidate for CRT or ICD given fragility and advanced age  33. DNI/DNR Patient and family are clear that his wishes are dni/dnr unless he requires transient intervention during his PPM implant. They request palliative care consultation to assist with  long term management options.  Very ill.  Prognosis is guarded at best.  [name], MD 10/16/2015  2:39 PM

## 2015-10-16 NOTE — H&P (View-Only) (Signed)
ELECTROPHYSIOLOGY CONSULT NOTE    Primary Care Physician: Kandice Hams, MD Referring Physician:  Dr Aundra Dubin  Admit Date: 10/03/2015  Reason for consultation:  AV block  Chad French is a 82 y.o. male with a h/o a complicated health history (reviewed) s/p recent MI now with transient complete heart block and syncope.  He had a 20 second RR interval today with stokes adams events.  No reversible causes are found.  He is therefore brought urgently for PPM implant.  Today, he denies symptoms of palpitations, chest pain, shortness of breath, orthopnea, PND, lower extremity edema, dizziness, presyncope, syncope, or neurologic sequela. The patient is tolerating medications without difficulties and is otherwise without complaint today.   Past Medical History  Diagnosis Date  . Diverticulitis     with colonoscopy in 11/2010-Diverticula and polyp whioch was a non-malignant tubuular adenoma  . HTN (hypertension)   . HLD (hyperlipidemia)   . Degenerative disc disease   . Diabetes mellitus ([place])   . Glaucoma    Past Surgical History  Procedure Laterality Date  . Colonoscopy  4/12  . Cardiac catheterization N/A 10/03/2015    Procedure: Left Heart Cath and Coronary Angiography;  Surgeon: Peter M Martinique, MD;  Location: [facility];  Service: Cardiovascular;  Laterality: N/A;  . Cardiac catheterization N/A 10/03/2015    Procedure: IABP Insertion;  Surgeon: Peter M Martinique, MD;  Location: [facility];  Service: Cardiovascular;  Laterality: N/A;  . Cardiac catheterization N/A 10/03/2015    Procedure: Coronary Stent Intervention;  Surgeon: Peter M Martinique, MD;  Location: [facility] [facility];  Service: Cardiovascular;  Laterality: N/A;    . [MAR Hold] antiseptic oral rinse  7 mL Mouth Rinse BID  . [MAR Hold] aspirin EC  81 mg Oral Daily  . [MAR Hold] atorvastatin  80 mg Oral q1800  .  ceFAZolin (ANCEF) IV  2 g Intravenous On Call  . [MAR Hold] cephALEXin  500 mg Oral 3 times per  day  . [MAR Hold] digoxin  0.125 mg Oral Daily  . [MAR Hold] feeding supplement (GLUCERNA SHAKE)  237 mL Oral TID BM  . [MAR Hold] furosemide  20 mg Oral Daily  . gentamicin irrigation  80 mg Irrigation To Cath  . [MAR Hold] insulin aspart  0-15 Units Subcutaneous TID WC  . [MAR Hold] lisinopril  2.5 mg Oral Daily  . [MAR Hold] potassium chloride  20 mEq Oral Daily  . [MAR Hold] sodium chloride flush  10-40 mL Intracatheter Q12H  . [MAR Hold] sodium chloride flush  3 mL Intravenous Q12H  . [MAR Hold] ticagrelor  90 mg Oral BID   . sodium chloride    . sodium chloride 10 mL/hr at 10/12/15 0800  . sodium chloride      No Known Allergies  Social History   Social History  . Marital Status: Married    Spouse Name: N/A  . Number of Children: N/A  . Years of Education: N/A   Occupational History  . Not on file.   Social History Main Topics  . Smoking status: Former Smoker -- 1.00 packs/day for 5 years    Quit date: 08/23/1971  . Smokeless tobacco: Not on file  . Alcohol Use: No  . Drug Use: No  . Sexual Activity: Not Currently   Other Topics Concern  . Not on file   Social History Narrative   Lives with Wife   Daughter is HCPOA    Family History  Problem Relation Age of Onset  . Diabetes Mother   . Diabetes Maternal Grandmother   . Kidney disease Maternal Grandfather     ROS- due to urgency of situation, could not perform  Physical Exam:  Filed Vitals:   10/16/15 0534 10/16/15 1000 10/16/15 1238 10/16/15 1400  BP: 110/61 118/49 123/80 86/57  Pulse: 80 59 106 59  Temp: 97.7 F (36.5 C) 97.8 F (36.6 C)    TempSrc: Oral Oral    Resp: 18 20 18 12   Height:      Weight: 149 lb (67.586 kg)     SpO2: 96% 97% 91% 100%    GEN- The patient is elderly and frail appearing, lethargic Head- normocephalic, atraumatic Eyes-  Sclera clear, conjunctiva pink Ears- hearing intact Oropharynx- clear Neck- supple,   Lungs- labored breathing Heart- Regular rate and  rhythm  GI- soft, NT, ND, + BS Extremities- no clubbing, cyanosis,+ dependant edema MS- diffuse atrophy Skin- no rash or lesion Psych- euthymic mood, full affect Neuro- strength and sensation are intact  EKG reviewed  Labs:   Lab Results  Component Value Date   WBC 11.1* 10/16/2015   HGB 10.5* 10/16/2015   HCT 32.6* 10/16/2015   MCV 100.6* 10/16/2015   PLT 479* 10/16/2015    Recent Labs Lab 10/16/15 0415  NA 142  K 3.4*  CL 109  CO2 26  BUN 33*  CREATININE 1.38*  CALCIUM 8.5*  GLUCOSE 131*   Lab Results  Component Value Date   TROPONINI >65.00* 10/04/2015    Lab Results  Component Value Date   CHOL 151 10/04/2015   Lab Results  Component Value Date   HDL 39* 10/04/2015   Lab Results  Component Value Date   LDLCALC 95 10/04/2015   Lab Results  Component Value Date   TRIG 84 10/04/2015   Lab Results  Component Value Date   CHOLHDL 3.9 10/04/2015   No results found for: LDLDIRECT     Echo:  reviewed  ASSESSMENT AND PLAN:   1. Complete heart block The patient presents with transient complete heart block and frequent mobitz II second degree AV block.  No reversible causes are found.  I spoke for a long time with patient and daughter.    Risks, benefits, alternatives to pacemaker implantation were discussed in detail with the patient today. The patient and daughter understands that the risks include but are not limited to bleeding, infection, pneumothorax, perforation, tamponade, vascular damage, renal failure, MI, stroke, death,  and lead dislodgement and wishes to proceed.  We will proceed urgently with PPM at this time.  2. Atrial fibrillation Hold anticoagulation presently  3. Ischemic CM/ CAD Not a candidate for CRT or ICD given fragility and advanced age  10. DNI/DNR Patient and family are clear that his wishes are dni/dnr unless he requires transient intervention during his PPM implant. They request palliative care consultation to assist with  long term management options.  Very ill.  Prognosis is guarded at best.  [name], MD 10/16/2015  2:39 PM

## 2015-10-16 NOTE — Progress Notes (Signed)
Contact person at [place] [facility]- [phone]

## 2015-10-16 NOTE — Care Management Note (Signed)
Case Management Note Previous CM note initiated by Donne Anon RN, CM  Patient Details  Name: Chad French MRN: [id number] Date of Birth: February 22, 1922  Subjective/Objective:     Adm w mi               Action/Plan: lives w wife, pcp dr polite   Expected Discharge Date:    10/16/15             Expected Discharge Plan:  [place]  In-House Referral:  Clinical Social Work  Discharge planning Services  CM Consult, Medication Assistance  Post Acute Care Choice:  NA Choice offered to:  NA  DME Arranged:    DME Agency:     HH Arranged:    [place] Agency:     Status of Service:  Completed, signed off  Medicare Important Message Given:  Yes Date Medicare IM Given:    Medicare IM give by:    Date Additional Medicare IM Given:    Additional Medicare Important Message give by:     If discussed at [place] of Stay Meetings, dates discussed:    Discharge Disposition: Skilled Facility   Additional Comments: gave pt 30day free brilinta card  10/16/15- Marvetta Gibbons RN, BSN- pt will be discharging to [facility]- CSW following for d/c needs- Health Dept called wife regarding infection- contact at HD is Bethena [phone]- [place] has info and has f/u with HD regarding issue. No further CM needs  Dawayne Patricia, RN 10/16/2015, 11:46 AM

## 2015-10-16 NOTE — Progress Notes (Signed)
Pt had a bradycardiac episode and had a pause of 20 sec. Pt daughter reported that she was feeding pt when he had suddenly stopped breathing with his color changing and stopped breathing for a while,Pt breathing now and said he feels ok. MD notified. Will continue to monitor.  Oren Beckmann, RN.

## 2015-10-16 NOTE — Progress Notes (Signed)
Paged for pause on tele for 20 second with unresponsiveness.  Now alert and oriented.  Had been brady overnight but not other significant pauses noted.   Dr Aundra Dubin aware.  Spoke to EP and pt will go for emergent pacemaker placement.   Legrand Como [address]" [place], PA-C 10/16/2015 1:06 PM

## 2015-10-16 NOTE — Progress Notes (Signed)
Spoke at length with patients daughter and also with Dr Aundra Dubin.  We agree that prognosis is very poor.  Anticipated survival is limited.  He has Cheyne-stokes breathing and cognition is intermittent. Pt and family have been clear that his wishes are DNI/DNR.  I have placed this order. Family is familiar with palliative care and would like to have palliative care consultation for assistance with short term and long term plans.  I have placed order for palliative care  [name] MD, [facility] 10/16/2015 3:38 PM

## 2015-10-16 NOTE — Progress Notes (Signed)
PT Cancellation Note  Patient Details Name: Chad French MRN: [id number] DOB: [birth year]   Cancelled Treatment:    Reason Eval/Treat Not Completed: Medical issues which prohibited therapy Pt went asystole for 20 seconds and now planned for emergent pacemaker placement. Holding PT today.   [name] 10/16/2015, 1:27 PM Wray Kearns, [place], DPT [phone]

## 2015-10-17 ENCOUNTER — Inpatient Hospital Stay (HOSPITAL_COMMUNITY): Payer: Medicare Other

## 2015-10-17 DIAGNOSIS — I441 Atrioventricular block, second degree: Secondary | ICD-10-CM | POA: Insufficient documentation

## 2015-10-17 LAB — GLUCOSE, CAPILLARY
GLUCOSE-CAPILLARY: 108 mg/dL — AB (ref 65–99)
Glucose-Capillary: 140 mg/dL — ABNORMAL HIGH (ref 65–99)
Glucose-Capillary: 146 mg/dL — ABNORMAL HIGH (ref 65–99)
Glucose-Capillary: 113 mg/dL — ABNORMAL HIGH (ref 65–99)

## 2015-10-17 LAB — BASIC METABOLIC PANEL
Anion gap: 7 (ref 5–15)
BUN: 28 mg/dL — AB (ref 6–20)
CALCIUM: 8.6 mg/dL — AB (ref 8.9–10.3)
CO2: 25 mmol/L (ref 22–32)
CREATININE: 1.39 mg/dL — AB (ref 0.61–1.24)
Chloride: 111 mmol/L (ref 101–111)
GFR calc Af Amer: 49 mL/min — ABNORMAL LOW (ref >60)
GFR calc non Af Amer: 42 mL/min — ABNORMAL LOW (ref >60)
GLUCOSE: 138 mg/dL — AB (ref 65–99)
Potassium: 4.2 mmol/L (ref 3.5–5.1)
Sodium: 143 mmol/L (ref 135–145)

## 2015-10-17 NOTE — Progress Notes (Signed)
SUBJECTIVE: The patient is doing well today.  He is much more alert but confused.   At this time, he denies chest pain, shortness of breath, or any new concerns.  Marland Kitchen antiseptic oral rinse  7 mL Mouth Rinse BID  . aspirin EC  81 mg Oral Daily  . atorvastatin  80 mg Oral q1800  .  ceFAZolin (ANCEF) IV  1 g Intravenous Q6H  . cephALEXin  500 mg Oral 3 times per day  . digoxin  0.125 mg Oral Daily  . feeding supplement (GLUCERNA SHAKE)  237 mL Oral TID BM  . furosemide  20 mg Oral Daily  . insulin aspart  0-15 Units Subcutaneous TID WC  . lisinopril  2.5 mg Oral Daily  . potassium chloride  20 mEq Oral Daily  . sodium chloride flush  3 mL Intravenous Q12H  . ticagrelor  90 mg Oral BID      OBJECTIVE: Physical Exam: Filed Vitals:   10/16/15 1525 10/16/15 1618 10/16/15 2207 10/17/15 0644  BP:  108/51 105/53 91/58  Pulse: 83 72 60 59  Temp:   98.7 F (37.1 C) 97.5 F (36.4 C)  TempSrc:   Oral Oral  Resp: 20  20 20   Height:      Weight:      SpO2: 91% 100% 100% 100%    Intake/Output Summary (Last 24 hours) at 10/17/15 0754 Last data filed at 10/16/15 2100  Gross per 24 hour  Intake    360 ml  Output    100 ml  Net    260 ml    Telemetry reveals sinus rhythm  GEN- The patient is elderly appearing, alert but confused Head- normocephalic, atraumatic Eyes-  Sclera clear, conjunctiva pink Ears- hearing intact Oropharynx- clear Neck- supple,   Lungs- Clear to ausculation bilaterally, normal work of breathing Heart- Regular rate and rhythm  GI- soft, NT, ND, + BS Extremities- no clubbing, cyanosis, or edema Psych- pleasantly confused Neuro- strength and sensation are intact  LABS: Basic Metabolic Panel:  Recent Labs  10/16/15 0415 10/17/15 0337  NA 142 143  K 3.4* 4.2  CL 109 111  CO2 26 25  GLUCOSE 131* 138*  BUN 33* 28*  CREATININE 1.38* 1.39*  CALCIUM 8.5* 8.6*   CBC:  Recent Labs  10/15/15 0500 10/16/15 0415  WBC 13.3* 11.1*  HGB 10.0* 10.5*    HCT 31.5* 32.6*  MCV 98.1 100.6*  PLT 465* 479*    ASSESSMENT AND PLAN:  Principal Problem:   STEMI (ST elevation myocardial infarction) (HCC) Active Problems:   Cardiogenic shock (HCC)   Abscess of right groin   UTI (urinary tract infection)   PAF (paroxysmal atrial fibrillation) (HCC)   Physical deconditioning   CHF (congestive heart failure) (HCC)   Acute systolic heart failure (HCC)  1. Mobitz II second degree AV block Device interrogation reviewed and normal No ptx on cxr  2. CAD/ ischemic CM/ acute systolic dysfunction No ischemic symptoms currently euvolemic appearing Continue current medicines Will add low dose coreg CHF team to follow  3. Atrial fibrillation Maintaining sinus rhythm May need to restart amiodarone 200mg  daily chads2vasc score is at least 6.   Would consider anticoagulation.  As he is on brilinta, would either need to change to plavix prior to addition of DOAC or consider coumadin.  I will defer to CHF team who knows patient well.  He may be a poor candidate for anticoagulation ultimately.  4. CRI Creatinine is stable today  5. UTI/ R groin abscess Resume oral keflex x 6 days once IV ancef (post PPM prophylaxis) is completed today  6. Deconditioning PT Plans aare for SNF  DNI/DNR Per family wishes, palliative care consult has been requested.  Electrophysiology team to see as needed while here. Please call with questions.   [name], MD 10/17/2015 7:54 AM

## 2015-10-17 NOTE — Progress Notes (Signed)
Spoke with Dr. Rowe Pavy regarding time palliative would come to see pt. Was told that palliative would come to see and talk to pt and family around 9-10 am on 10/18/15. Family notified. Will continue to monitor. Vicente Males Therapist, sports

## 2015-10-17 NOTE — Clinical Social Work Note (Signed)
Patient is not medically stable for discharge today. CSW remains available as needed.   Glendon Axe, MSW, LCSWA [phone] 10/17/2015 10:51 AM

## 2015-10-18 DIAGNOSIS — R410 Disorientation, unspecified: Secondary | ICD-10-CM | POA: Insufficient documentation

## 2015-10-18 DIAGNOSIS — Z515 Encounter for palliative care: Secondary | ICD-10-CM | POA: Insufficient documentation

## 2015-10-18 DIAGNOSIS — R0600 Dyspnea, unspecified: Secondary | ICD-10-CM

## 2015-10-18 LAB — BASIC METABOLIC PANEL
ANION GAP: 12 (ref 5–15)
BUN: 26 mg/dL — AB (ref 6–20)
CHLORIDE: 110 mmol/L (ref 101–111)
CO2: 23 mmol/L (ref 22–32)
Calcium: 8.8 mg/dL — ABNORMAL LOW (ref 8.9–10.3)
Creatinine, Ser: 1.4 mg/dL — ABNORMAL HIGH (ref 0.61–1.24)
GFR calc Af Amer: 49 mL/min — ABNORMAL LOW (ref >60)
GFR, EST NON AFRICAN AMERICAN: 42 mL/min — AB (ref >60)
GLUCOSE: 158 mg/dL — AB (ref 65–99)
POTASSIUM: 4.4 mmol/L (ref 3.5–5.1)
Sodium: 145 mmol/L (ref 135–145)

## 2015-10-18 LAB — GLUCOSE, CAPILLARY
GLUCOSE-CAPILLARY: 152 mg/dL — AB (ref 65–99)
GLUCOSE-CAPILLARY: 133 mg/dL — AB (ref 65–99)
Glucose-Capillary: 121 mg/dL — ABNORMAL HIGH (ref 65–99)
Glucose-Capillary: 112 mg/dL — ABNORMAL HIGH (ref 65–99)

## 2015-10-18 MED ORDER — CARVEDILOL 3.125 MG PO TABS
3.1250 mg | ORAL_TABLET | Freq: Two times a day (BID) | ORAL | Status: DC
  Administered 2015-10-18 – 2015-10-20 (×5): 3.125 mg via ORAL
  Filled 2015-10-18 (×6): qty 1

## 2015-10-18 MED ORDER — MORPHINE SULFATE (PF) 2 MG/ML IV SOLN: 1.0000 mg | INTRAVENOUS | Status: DC | PRN

## 2015-10-18 NOTE — Consult Note (Signed)
Consultation Note Date: 10/18/2015   Patient Name: Chad French  DOB: 08/31/1921  MRN: [id number]  Age / Sex: 82 y.o., male  PCP: Seward Carol, MD Referring Physician: Peter M Martinique, MD  Reason for Consultation: Establishing goals of care    Clinical Assessment/Narrative:   82 yo with a past medical history significant for paroxysmal atrial fibrillation diabetes hypertension was admitted with a ST segment elevation MI and cardiogenic shock. He had PCI to left circumflex and was placed on intra-aortic balloon pump. Patient is known to have chronic occlusion of LAD. Ejection fraction 20-25 percent. Also has stage III chronic kidney disease. Hospital course also complicated by vaccine and waning delirium, here tract infection and right groin abscess. Wound culture showing group A strep. Patient being titrated on antibiotics. Hospital course complicated by ongoing confusion, asystole patient is status post pacemaker placement. Palliative care consult for goals of care.  Patient is an elderly gentleman lying in bed. He opens his eyes and tracks me in the room. He is moaning. He appears in mild generalized distress. No family present at the bedside. Discussed with bedside RN. Call placed and discussed with daughter Chad French. Brief life review performed. Patient lives at home with his wife who is also 28 years old. Patient's daughter Chad French lives in [place] but visits regularly and is the primary caregiver. Patient has not had any recurrent hospitalizations in the past several months. He has had generalized weakness. Daughter states that the patient never complains. He had some help with [place] home health at home.  Daughter states she is most concerned about the patient's ongoing decline in this current hospitalization. She is asking about options for discharge and estimated prognosis based on patient's disease trajectory.  Discussed that there are plans underway for skilled nursing facility for rehabilitation attempt. Discussed that if the patient is able to go to an SNF for rehabilitation, would advise palliative follow-up. We can have case management set that up at the time of discharge. However, discussed frankly with the patient's daughter Chad French over the phone about the patient's acute precipitous was decline in this hospitalization. Discussed about the patient's current mental status and wondering about how much he will actually be able to participate in rehabilitation.  Discussed that if the patient continues to remain confused, minimal oral intake, worsening symptom burden, we will have to consider options such as discharged to hospice home towards the end of this hospitalization in pursuing a mode of care that would focus exclusively on comfort. All questions answered to the best of my ability.  Plan is that the patient's daughter Chad French is coming back into town on 2-27 along with the patient's wife and patient's other daughter Chad French who is from [place]. Family meeting set up for 227 at 9:30 AM. We will discuss all of the above in great detail as well as attempt to elicit goals with regards to appropriate disposition planning. For now, symptom management with IV morphine on an as-needed basis.  Thank you for the consult palliative will follow along.  Contacts/Participants in Discussion: Primary Decision Maker:  Relationship to Patient  Daughter Chad French, patient also has a wife  HCPOA: yes     SUMMARY OF RECOMMENDATIONS: DNR DNI Family meeting with daughter Chad French and wife 2-27 at 0930 am for further discussions.  Low dose Morphine IV PRN for chest pain/dyspnea.    Code Status/Advance Care Planning: DNR    Code Status Orders        Start  Ordered   10/16/15 1733  Do not attempt resuscitation (DNR)   Continuous    Question Answer Comment  In the event of cardiac or respiratory ARREST Do not call a  "code blue"   In the event of cardiac or respiratory ARREST Do not perform Intubation, CPR, defibrillation or ACLS   In the event of cardiac or respiratory ARREST Use medication by any route, position, wound care, and other measures to relive pain and suffering. May use oxygen, suction and manual treatment of airway obstruction as needed for comfort.      10/16/15 1733    Code Status History    Date Active Date Inactive Code Status Order ID Comments User Context   10/04/2015 12:14 AM 10/16/2015  5:33 PM Full Code [id number]  Nelva Bush, MD Inpatient   10/03/2015  8:53 PM 10/04/2015 12:14 AM DNR [id number]  Leo Grosser, MD ED   12/14/2013  9:41 PM 10/03/2015  8:53 PM Full Code [id number]  Hennie Duos, MD Outpatient   11/27/2013  5:52 PM 12/02/2013  3:39 PM Full Code [id number]  Bonnielee Haff, MD ED      Other Directives:None  Symptom Management:    Morphine IV when necessary  Palliative Prophylaxis:   Delirium Protocol     Psycho-social/Spiritual:  Support System: [place] Desire for further Chaplaincy support:no Additional Recommendations: Caregiving  Support/Resources  Prognosis: ? weeks  Discharge Planning: pending hospital course and further discussions.    Chief Complaint/ Primary Diagnoses: Present on Admission:  . STEMI (ST elevation myocardial infarction) ([place])  I have reviewed the medical record, interviewed the patient and family, and examined the patient. The following aspects are pertinent.  Past Medical History  Diagnosis Date  . Diverticulitis     with colonoscopy in 11/2010-Diverticula and polyp whioch was a non-malignant tubuular adenoma  . HTN (hypertension)   . HLD (hyperlipidemia)   . Degenerative disc disease   . Diabetes mellitus ([place])   . Glaucoma    Social History   Social History  . Marital Status: Married    Spouse Name: N/A  . Number of Children: N/A  . Years of Education: N/A   Social History Main Topics  . Smoking status: Former  Smoker -- 1.00 packs/day for 5 years    Quit date: 08/23/1971  . Smokeless tobacco: None  . Alcohol Use: No  . Drug Use: No  . Sexual Activity: Not Currently   Other Topics Concern  . None   Social History Narrative   Lives with Wife   Daughter is HCPOA   Family History  Problem Relation Age of Onset  . Diabetes Mother   . Diabetes Maternal Grandmother   . Kidney disease Maternal Grandfather    Scheduled Meds: . antiseptic oral rinse  7 mL Mouth Rinse BID  . aspirin EC  81 mg Oral Daily  . atorvastatin  80 mg Oral q1800  . carvedilol  3.125 mg Oral BID WC  . cephALEXin  500 mg Oral 3 times per day  . digoxin  0.125 mg Oral Daily  . feeding supplement (GLUCERNA SHAKE)  237 mL Oral TID BM  . furosemide  20 mg Oral Daily  . insulin aspart  0-15 Units Subcutaneous TID WC  . lisinopril  2.5 mg Oral Daily  . potassium chloride  20 mEq Oral Daily  . sodium chloride flush  3 mL Intravenous Q12H  . ticagrelor  90 mg Oral BID   Continuous Infusions:  PRN Meds:.sodium chloride, acetaminophen, HYDROcodone-acetaminophen,  ipratropium-albuterol, neomycin-bacitracin-polymyxin, nitroGLYCERIN, ondansetron (ZOFRAN) IV, sodium chloride flush Medications Prior to Admission:  Prior to Admission medications   Medication Sig Start Date End Date Taking? Authorizing Provider  cholecalciferol (VITAMIN D) 400 UNITS TABS tablet Take 400 Units by mouth daily.   Yes Historical Provider, MD  lisinopril (PRINIVIL,ZESTRIL) 10 MG tablet Take 1 tablet (10 mg total) by mouth daily. 06/12/14  Yes Jerline Pain, MD  acetaminophen (TYLENOL) 325 MG tablet Take 2 tablets (650 mg total) by mouth every 6 (six) hours as needed for mild pain or headache. 10/16/15   Shirley Friar, PA-C  aspirin EC 81 MG EC tablet Take 1 tablet (81 mg total) by mouth daily. 10/16/15   Shirley Friar, PA-C  atorvastatin (LIPITOR) 80 MG tablet Take 1 tablet (80 mg total) by mouth daily at 6 PM. 10/16/15   Shirley Friar, PA-C  cephALEXin (KEFLEX) 500 MG capsule Take 1 capsule (500 mg total) by mouth 3 (three) times daily. 10/16/15 10/19/15  Shirley Friar, PA-C  digoxin (LANOXIN) 0.125 MG tablet Take 1 tablet (0.125 mg total) by mouth daily. 10/16/15   Shirley Friar, PA-C  furosemide (LASIX) 20 MG tablet Take 1 tablet (20 mg total) by mouth daily. 10/16/15   Shirley Friar, PA-C  ipratropium-albuterol (DUONEB) 0.5-2.5 (3) MG/3ML SOLN Take 3 mLs by nebulization every 6 (six) hours as needed. 10/16/15   Shirley Friar, PA-C  lisinopril (PRINIVIL,ZESTRIL) 2.5 MG tablet Take 1 tablet (2.5 mg total) by mouth daily. 10/16/15   Shirley Friar, PA-C  nitroGLYCERIN (NITROSTAT) 0.4 MG SL tablet Place 1 tablet (0.4 mg total) under the tongue every 5 (five) minutes x 3 doses as needed for chest pain. 10/16/15   Shirley Friar, PA-C  potassium chloride SA (K-DUR,KLOR-CON) 20 MEQ tablet Take 1 tablet (20 mEq total) by mouth daily. 10/16/15   Shirley Friar, PA-C  ticagrelor (BRILINTA) 90 MG TABS tablet Take 1 tablet (90 mg total) by mouth 2 (two) times daily. 10/16/15   Shirley Friar, PA-C   No Known Allergies  Review of Systems Non verbal  Physical Exam Weak appearing elderly gentleman  Opens eyes and tracks me in the room S1 S2 Diminished  no edema Moans periodically  Vital Signs: BP 100/55 mmHg  Pulse 71  Temp(Src) 97.7 F (36.5 C) (Oral)  Resp 18  Ht 5\' 7"  (1.702 m)  Wt 67.586 kg (149 lb)  BMI 23.33 kg/m2  SpO2 92%  SpO2: SpO2: 92 % O2 Device:SpO2: 92 % O2 Flow Rate: .O2 Flow Rate (L/min): 3 L/min  IO: Intake/output summary:  Intake/Output Summary (Last 24 hours) at 10/18/15 1126 Last data filed at 10/17/15 1500  Gross per 24 hour  Intake    180 ml  Output     50 ml  Net    130 ml    LBM: Last BM Date: 10/16/15 Baseline Weight: Weight: 76 kg (167 lb 8.8 oz) Most recent weight: Weight: 67.586 kg (149 lb)      Palliative  Assessment/Data:  Flowsheet Rows        Most Recent Value   Intake Tab    Referral Department  Cardiology   Unit at Time of Referral  Cardiac/Telemetry Unit   Palliative Care Primary Diagnosis  Cardiac   Palliative Care Type  New Palliative care   Reason for referral  Clarify Goals of Care   Date first seen by Palliative Care  10/18/15   Clinical Assessment  Palliative Performance Scale Score  30%   Pain Max last 24 hours  6   Pain Min Last 24 hours  5   Dyspnea Max Last 24 Hours  6   Dyspnea Min Last 24 hours  5   Psychosocial & Spiritual Assessment    Palliative Care Outcomes    Patient/Family meeting held?  Yes   Who was at the meeting?  daughter Chad French over the phone    Palliative Care Outcomes  Clarified goals of care   Palliative Care follow-up planned  Yes, Facility      Additional Data Reviewed:  CBC:    Component Value Date/Time   WBC 11.1* 10/16/2015 0415   HGB 10.5* 10/16/2015 0415   HCT 32.6* 10/16/2015 0415   PLT 479* 10/16/2015 0415   MCV 100.6* 10/16/2015 0415   NEUTROABS 13.0* 10/03/2015 1856   LYMPHSABS 1.5 10/03/2015 1856   MONOABS 2.3* 10/03/2015 1856   EOSABS 0.0 10/03/2015 1856   BASOSABS 0.0 10/03/2015 1856   Comprehensive Metabolic Panel:    Component Value Date/Time   NA 145 10/18/2015 0238   K 4.4 10/18/2015 0238   CL 110 10/18/2015 0238   CO2 23 10/18/2015 0238   BUN 26* 10/18/2015 0238   CREATININE 1.40* 10/18/2015 0238   GLUCOSE 158* 10/18/2015 0238   CALCIUM 8.8* 10/18/2015 0238   AST 359* 10/03/2015 1856   ALT 76* 10/03/2015 1856   ALKPHOS 64 10/03/2015 1856   BILITOT 1.0 10/03/2015 1856   PROT 6.8 10/03/2015 1856   ALBUMIN 3.9 10/03/2015 1856     Time In: 9 Time Out: 10 Time Total: 60 min    Greater than 50%  of this time was spent counseling and coordinating care related to the above assessment and plan.  Signed by: Loistine Chance, MD [id number] Loistine Chance, MD  10/18/2015, 11:26 AM  Please contact Palliative Medicine  Team phone at [phone] for questions and concerns.

## 2015-10-18 NOTE — Progress Notes (Addendum)
Patient ID: Chad French, male   DOB: 10-27-1921, 82 y.o.   MRN: [id number]   SUBJECTIVE:    Confused and moaning. Able to answer some questions though. Denies CP or dyspnea.    Scheduled Meds: . antiseptic oral rinse  7 mL Mouth Rinse BID  . aspirin EC  81 mg Oral Daily  . atorvastatin  80 mg Oral q1800  . cephALEXin  500 mg Oral 3 times per day  . digoxin  0.125 mg Oral Daily  . feeding supplement (GLUCERNA SHAKE)  237 mL Oral TID BM  . furosemide  20 mg Oral Daily  . insulin aspart  0-15 Units Subcutaneous TID WC  . lisinopril  2.5 mg Oral Daily  . potassium chloride  20 mEq Oral Daily  . sodium chloride flush  3 mL Intravenous Q12H  . ticagrelor  90 mg Oral BID   Continuous Infusions:   PRN Meds:.sodium chloride, acetaminophen, HYDROcodone-acetaminophen, ipratropium-albuterol, neomycin-bacitracin-polymyxin, nitroGLYCERIN, ondansetron (ZOFRAN) IV, sodium chloride flush    Filed Vitals:   10/17/15 1000 10/17/15 1405 10/17/15 2032 10/18/15 0510  BP: 114/63 100/52 96/49 108/51  Pulse:  62 65 99  Temp:  98 F (36.7 C) 98 F (36.7 C) 97.7 F (36.5 C)  TempSrc:  Oral Axillary Oral  Resp:  17 20 18   Height:      Weight:      SpO2:  100% 95% 92%    Intake/Output Summary (Last 24 hours) at 10/18/15 0814 Last data filed at 10/17/15 1500  Gross per 24 hour  Intake    180 ml  Output     50 ml  Net    130 ml    LABS: Basic Metabolic Panel:  Recent Labs  10/17/15 0337 10/18/15 0238  NA 143 145  K 4.2 4.4  CL 111 110  CO2 25 23  GLUCOSE 138* 158*  BUN 28* 26*  CREATININE 1.39* 1.40*  CALCIUM 8.6* 8.8*   Liver Function Tests: No results for input(s): AST, ALT, ALKPHOS, BILITOT, PROT, ALBUMIN in the last 72 hours. No results for input(s): LIPASE, AMYLASE in the last 72 hours. CBC:  Recent Labs  10/16/15 0415  WBC 11.1*  HGB 10.5*  HCT 32.6*  MCV 100.6*  PLT 479*   Cardiac Enzymes: No results for input(s): CKTOTAL, CKMB, CKMBINDEX, TROPONINI  in the last 72 hours. BNP: Invalid input(s): POCBNP D-Dimer: No results for input(s): DDIMER in the last 72 hours. Hemoglobin A1C: No results for input(s): HGBA1C in the last 72 hours. Fasting Lipid Panel: No results for input(s): CHOL, HDL, LDLCALC, TRIG, CHOLHDL, LDLDIRECT in the last 72 hours. Thyroid Function Tests: No results for input(s): TSH, T4TOTAL, T3FREE, THYROIDAB in the last 72 hours.  Invalid input(s): FREET3 Anemia Panel: No results for input(s): VITAMINB12, FOLATE, FERRITIN, TIBC, IRON, RETICCTPCT in the last 72 hours.  RADIOLOGY: Dg Chest 2 View  10/17/2015  CLINICAL DATA:  Post pacemaker insertion EXAM: CHEST  2 VIEW COMPARISON:  10/14/2015 FINDINGS: Left pacer is in place with leads in the right atrium and right ventricle. No pneumothorax. There is cardiomegaly with vascular congestion. Aortic calcifications. No confluent airspace opacities, effusions or edema. No acute bony abnormality. IMPRESSION: Left pacer placement without pneumothorax. Cardiomegaly with vascular congestion. Electronically Signed   By: Rolm Baptise M.D.   On: 10/17/2015 09:13   Dg Chest 2 View  10/03/2015  CLINICAL DATA:  Fall today and generalized weakness EXAM: CHEST  2 VIEW COMPARISON:  11/28/2013 FINDINGS: Cardiac shadow is mildly  enlarged but stable. Aortic calcifications are again seen. The lungs are well aerated bilaterally. Mild interstitial changes are noted within both lungs. No focal confluent infiltrate is seen. No acute bony abnormality is noted. IMPRESSION: No acute abnormality noted. Electronically Signed   By: Inez Catalina M.D.   On: 10/03/2015 18:59   Ct Head Wo Contrast  10/08/2015  CLINICAL DATA:  Confusion.  History of hypertension and diabetes. EXAM: CT HEAD WITHOUT CONTRAST TECHNIQUE: Contiguous axial images were obtained from the base of the skull through the vertex without intravenous contrast. COMPARISON:  None. FINDINGS: No evidence for acute infarction, hemorrhage, mass  lesion, hydrocephalus, or extra-axial fluid. Advanced cerebral and cerebellar atrophy. Extensive hypoattenuation of white matter, likely chronic microvascular ischemic change. Calvarium is intact. There is been previous ocular banding surgery. There is calcification of the skull base internal carotid arteries and distal BILATERAL vertebral arteries. Minor chronic sinus disease. No mastoid fluid. IMPRESSION: Atrophy and small vessel disease.  No acute intracranial findings. Electronically Signed   By: Staci Righter M.D.   On: 10/08/2015 14:59   Dg Chest Port 1 View  10/14/2015  CLINICAL DATA:  Multiple episodes of productive cough and dyspnea EXAM: PORTABLE CHEST 1 VIEW COMPARISON:  10/08/2015 FINDINGS: Cardiomegaly again noted. Atherosclerotic calcifications of thoracic aorta. Right arm PICC line is unchanged in position. Persistent mild perihilar and mild infrahilar interstitial prominence without convincing pulmonary edema. No segmental infiltrates. No pleural effusion. IMPRESSION: Right arm PICC line is unchanged in position. Persistent mild perihilar and mild infrahilar interstitial prominence without convincing pulmonary edema. No segmental infiltrates. No pleural effusion. Electronically Signed   By: Lahoma Crocker M.D.   On: 10/14/2015 12:36   Dg Chest Port 1 View  10/08/2015  CLINICAL DATA:  Confusion EXAM: PORTABLE CHEST 1 VIEW COMPARISON:  Portable chest x-ray of 10/06/2015 FINDINGS: The previously noted interstitial opacities primarily throughout the right lung have cleared. There is mild basilar volume loss right greater than left. Cardiomegaly is stable. The right PICC line tip overlies the lower SVC. No pneumothorax is seen. IMPRESSION: 1. Marked improvement in interstitial opacities particularly throughout the right lung. 2. Stable cardiomegaly with mild basilar volume loss. Electronically Signed   By: Ivar Drape M.D.   On: 10/08/2015 08:15   Dg Chest Port 1 View  10/06/2015  CLINICAL DATA:   CHF, STEMI, cardiogenic shock. EXAM: PORTABLE CHEST 1 VIEW COMPARISON:  Portable chest x-ray of October 05, 2015 FINDINGS: The lungs are adequately inflated. The pulmonary interstitial markings remain increased. The pulmonary vascularity remains engorged. The cardiac silhouette remains enlarged. There is no pleural effusion or pneumothorax. The right-sided PICC line tip projects over the distal third of the SVC. IMPRESSION: Persistent pulmonary interstitial edema little changed from yesterday's study. Cardiomegaly with pulmonary vascular congestion, stable. Electronically Signed   By: David  Martinique M.D.   On: 10/06/2015 07:27   Dg Chest Port 1 View  10/05/2015  CLINICAL DATA:  New PICC Line EXAM: PORTABLE CHEST 1 VIEW COMPARISON:  10/05/2015 FINDINGS: Interval placement RIGHT PICC line with tip in distal SVC. Stable cardiac silhouette. Mild interstitial/pulmonary edema similar. No pneumothorax. IMPRESSION: 1. RIGHT PICC line in good position. 2. Mild interstitial / pulmonary edema. Electronically Signed   By: Suzy Bouchard M.D.   On: 10/05/2015 10:01   Dg Chest Port 1 View  10/05/2015  CLINICAL DATA:  CHF, STEMI, cardiogenic shock. EXAM: PORTABLE CHEST 1 VIEW COMPARISON:  Portable chest x-ray dated October 04, 2015 FINDINGS: The lungs are well-expanded. The  pulmonary interstitial markings are less conspicuous today. The pulmonary vascularity is more distinct and less engorged. The cardiac silhouette remains enlarged. There is no significant pleural effusion. The retrocardiac region on the left is more dense however. There is calcification in the wall of the ascending aorta and aortic arch. IMPRESSION: Improved appearance of the pulmonary interstitium and pulmonary vascularity consistent with decreased CHF. Developing left lower lobe atelectasis. There is no pleural effusion or pneumothorax. Electronically Signed   By: David  Martinique M.D.   On: 10/05/2015 07:32   Portable Chest X-ray 1 View  10/04/2015   CLINICAL DATA:  Acute onset of shortness of breath. Initial encounter. EXAM: PORTABLE CHEST 1 VIEW COMPARISON:  Chest radiograph performed 10/03/2015 FINDINGS: The lungs are well-aerated. Vascular congestion is noted. Increased interstitial markings raise concern for pulmonary edema. There is no evidence of pleural effusion or pneumothorax. The cardiomediastinal silhouette is mildly enlarged. No acute osseous abnormalities are seen. An intra-aortic balloon pump tip is noted 2 cm below the aortic knob, as expected. IMPRESSION: Vascular congestion and mild cardiomegaly. Increased interstitial markings raise concern for pulmonary edema, new from the prior study. Electronically Signed   By: Garald Balding M.D.   On: 10/04/2015 00:44   Filed Vitals:   10/17/15 1000 10/17/15 1405 10/17/15 2032 10/18/15 0510  BP: 114/63 100/52 96/49 108/51  Pulse:  62 65 99  Temp:  98 F (36.7 C) 98 F (36.7 C) 97.7 F (36.5 C)  TempSrc:  Oral Axillary Oral  Resp:  17 20 18   Height:      Weight:      SpO2:  100% 95% 92%     PHYSICAL EXAM General: NAD. In bed. Confused. Moaning Neck: JVP 6cm. No thyromegaly or thyroid nodule.  Lungs: Lungs diminished.  CV: Heart regular S1/S2, no S3/S4, no murmur.  No peripheral edema.  Pacer site Abdomen: Soft, NT, ND, no HSM. No bruits or masses. +BS  Neurologic: Alert and oriented x3   Psych: Oriented to self. Normal affect. Extremities: No clubbing or cyanosis. Dressing intact   TELEMETRY: Reviewed personally, NSR 70s. Sinus tach 100-105  ASSESSMENT AND PLAN: 82 yo with history of suspected prior silent MI was admitted with lateral STEMI/cardiogenic shock.  He had PCI to LCx/OM and IABP placement.  1. CAD: Lateral STEMI. LHC with culprit 99% LCx lesion, but also chronic occlusion of the LAD and 75% pRCA stenosis.  Now s/p DES to LCx/OM.   - Continue ASA 81 and ticagrelor.  - continue Atorvastatin 80 daily.  2. Acute systolic CHF/cardiogenic shock: IABP out 2/14 and  norepinephrine off 2/15.  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 Off milrinone - Continue lasix 20 mg daily. Volume status seems ok. K 4.4.   - Continue lisinopril 2.5 mg daily, may have to hold if creatinine goes up again.   -  Will try to add carvedilol 3.125 bid -  Continue dig 0.125 mg daily. Level was ok 3. Atrial fibrillation: He had atrial fibrillation earlier in stay.Was on amio but stopped due to bradycardia. Now s/p PPM. Will leave amio off for now.  4. CKD stage 3: Creatinine 1.50  5. Delirium: Waxing and waning confusion. Today he is oriented   6. ID: Sepsis due to UTI and R groin abscess (s/p I&D 2/17). - Found to have sepsis in setting UTI and R groin abscess.  - Wound cx with Group A strep. Off vanc. Stop zosyn. Continue keflex 500 mg every 8 hours x  6 days.   -  Change wound packing with iodoform daily.  7. Deconditioning - Worked with PT/OT in house.  To SNF on d/c 8. Suspected OSA.?  - Will need formal sleep study as outpatient.  9. Asystole - s/p PPM on Friday 10. DNR/DNI  Continues with intermittent confusion. Otherwise seems stable for SNF soon vs Hospice. Palliative Care Consult has been called.    Glori Bickers MD 10/18/2015  8:14 AM   Advanced Heart Failure Team Pager [phone] (M-F; [place])  Please contact [facility] for night-coverage after hours (4p -7a ) and weekends on [email or web address]

## 2015-10-19 ENCOUNTER — Encounter (HOSPITAL_COMMUNITY): Payer: Self-pay | Admitting: Internal Medicine

## 2015-10-19 DIAGNOSIS — Z7189 Other specified counseling: Secondary | ICD-10-CM

## 2015-10-19 LAB — GLUCOSE, CAPILLARY
GLUCOSE-CAPILLARY: 115 mg/dL — AB (ref 65–99)
Glucose-Capillary: 127 mg/dL — ABNORMAL HIGH (ref 65–99)
Glucose-Capillary: 121 mg/dL — ABNORMAL HIGH (ref 65–99)
Glucose-Capillary: 113 mg/dL — ABNORMAL HIGH (ref 65–99)

## 2015-10-19 LAB — BASIC METABOLIC PANEL
Anion gap: 17 — ABNORMAL HIGH (ref 5–15)
Anion gap: 9 (ref 5–15)
BUN: 30 mg/dL — AB (ref 6–20)
BUN: 27 mg/dL — AB (ref 6–20)
CALCIUM: 8.9 mg/dL (ref 8.9–10.3)
CALCIUM: 8.8 mg/dL — AB (ref 8.9–10.3)
CO2: 20 mmol/L — ABNORMAL LOW (ref 22–32)
CO2: 26 mmol/L (ref 22–32)
CREATININE: 1.35 mg/dL — AB (ref 0.61–1.24)
Chloride: 113 mmol/L — ABNORMAL HIGH (ref 101–111)
Chloride: 110 mmol/L (ref 101–111)
Creatinine, Ser: 1.38 mg/dL — ABNORMAL HIGH (ref 0.61–1.24)
GFR calc Af Amer: 50 mL/min — ABNORMAL LOW (ref >60)
GFR calc Af Amer: 51 mL/min — ABNORMAL LOW (ref >60)
GFR, EST NON AFRICAN AMERICAN: 43 mL/min — AB (ref >60)
GFR, EST NON AFRICAN AMERICAN: 44 mL/min — AB (ref >60)
GLUCOSE: 119 mg/dL — AB (ref 65–99)
GLUCOSE: 129 mg/dL — AB (ref 65–99)
Potassium: 5.7 mmol/L — ABNORMAL HIGH (ref 3.5–5.1)
Potassium: 4.9 mmol/L (ref 3.5–5.1)
SODIUM: 150 mmol/L — AB (ref 135–145)
Sodium: 145 mmol/L (ref 135–145)

## 2015-10-19 NOTE — Progress Notes (Signed)
Patient ID: Chad French, male   DOB: 10-15-1921, 82 y.o.   MRN: [id number]   SUBJECTIVE:    More alert this morning. Denies SOB or CP currently.  Slightly lightheaded working with PT.    Scheduled Meds: . antiseptic oral rinse  7 mL Mouth Rinse BID  . aspirin EC  81 mg Oral Daily  . atorvastatin  80 mg Oral q1800  . carvedilol  3.125 mg Oral BID WC  . cephALEXin  500 mg Oral 3 times per day  . digoxin  0.125 mg Oral Daily  . feeding supplement (GLUCERNA SHAKE)  237 mL Oral TID BM  . furosemide  20 mg Oral Daily  . insulin aspart  0-15 Units Subcutaneous TID WC  . lisinopril  2.5 mg Oral Daily  . potassium chloride  20 mEq Oral Daily  . sodium chloride flush  3 mL Intravenous Q12H  . ticagrelor  90 mg Oral BID   Continuous Infusions:   PRN Meds:.sodium chloride, acetaminophen, HYDROcodone-acetaminophen, ipratropium-albuterol, morphine injection, neomycin-bacitracin-polymyxin, nitroGLYCERIN, ondansetron (ZOFRAN) IV, sodium chloride flush    Filed Vitals:   10/18/15 1034 10/18/15 1255 10/18/15 2009 10/19/15 0505  BP: 100/55 104/59 90/38 94/47   Pulse: 71 62 60 83  Temp:  98 F (36.7 C) 98.2 F (36.8 C) 97.4 F (36.3 C)  TempSrc:  Axillary Oral Axillary  Resp:  17 20 20   Height:      Weight:      SpO2:  95% 97% 96%    Intake/Output Summary (Last 24 hours) at 10/19/15 0951 Last data filed at 10/19/15 0854  Gross per 24 hour  Intake    260 ml  Output   1021 ml  Net   -761 ml    LABS: Basic Metabolic Panel:  Recent Labs  10/18/15 0238 10/19/15 0600  NA 145 150*  K 4.4 5.7*  CL 110 113*  CO2 23 20*  GLUCOSE 158* 119*  BUN 26* 30*  CREATININE 1.40* 1.38*  CALCIUM 8.8* 8.9   Liver Function Tests: No results for input(s): AST, ALT, ALKPHOS, BILITOT, PROT, ALBUMIN in the last 72 hours. No results for input(s): LIPASE, AMYLASE in the last 72 hours. CBC: No results for input(s): WBC, NEUTROABS, HGB, HCT, MCV, PLT in the last 72 hours. Cardiac  Enzymes: No results for input(s): CKTOTAL, CKMB, CKMBINDEX, TROPONINI in the last 72 hours. BNP: Invalid input(s): POCBNP D-Dimer: No results for input(s): DDIMER in the last 72 hours. Hemoglobin A1C: No results for input(s): HGBA1C in the last 72 hours. Fasting Lipid Panel: No results for input(s): CHOL, HDL, LDLCALC, TRIG, CHOLHDL, LDLDIRECT in the last 72 hours. Thyroid Function Tests: No results for input(s): TSH, T4TOTAL, T3FREE, THYROIDAB in the last 72 hours.  Invalid input(s): FREET3 Anemia Panel: No results for input(s): VITAMINB12, FOLATE, FERRITIN, TIBC, IRON, RETICCTPCT in the last 72 hours.  RADIOLOGY: Dg Chest 2 View  10/17/2015  CLINICAL DATA:  Post pacemaker insertion EXAM: CHEST  2 VIEW COMPARISON:  10/14/2015 FINDINGS: Left pacer is in place with leads in the right atrium and right ventricle. No pneumothorax. There is cardiomegaly with vascular congestion. Aortic calcifications. No confluent airspace opacities, effusions or edema. No acute bony abnormality. IMPRESSION: Left pacer placement without pneumothorax. Cardiomegaly with vascular congestion. Electronically Signed   By: Rolm Baptise M.D.   On: 10/17/2015 09:13   Dg Chest 2 View  10/03/2015  CLINICAL DATA:  Fall today and generalized weakness EXAM: CHEST  2 VIEW COMPARISON:  11/28/2013 FINDINGS: Cardiac shadow  is mildly enlarged but stable. Aortic calcifications are again seen. The lungs are well aerated bilaterally. Mild interstitial changes are noted within both lungs. No focal confluent infiltrate is seen. No acute bony abnormality is noted. IMPRESSION: No acute abnormality noted. Electronically Signed   By: Inez Catalina M.D.   On: 10/03/2015 18:59   Ct Head Wo Contrast  10/08/2015  CLINICAL DATA:  Confusion.  History of hypertension and diabetes. EXAM: CT HEAD WITHOUT CONTRAST TECHNIQUE: Contiguous axial images were obtained from the base of the skull through the vertex without intravenous contrast. COMPARISON:   None. FINDINGS: No evidence for acute infarction, hemorrhage, mass lesion, hydrocephalus, or extra-axial fluid. Advanced cerebral and cerebellar atrophy. Extensive hypoattenuation of white matter, likely chronic microvascular ischemic change. Calvarium is intact. There is been previous ocular banding surgery. There is calcification of the skull base internal carotid arteries and distal BILATERAL vertebral arteries. Minor chronic sinus disease. No mastoid fluid. IMPRESSION: Atrophy and small vessel disease.  No acute intracranial findings. Electronically Signed   By: Staci Righter M.D.   On: 10/08/2015 14:59   Dg Chest Port 1 View  10/14/2015  CLINICAL DATA:  Multiple episodes of productive cough and dyspnea EXAM: PORTABLE CHEST 1 VIEW COMPARISON:  10/08/2015 FINDINGS: Cardiomegaly again noted. Atherosclerotic calcifications of thoracic aorta. Right arm PICC line is unchanged in position. Persistent mild perihilar and mild infrahilar interstitial prominence without convincing pulmonary edema. No segmental infiltrates. No pleural effusion. IMPRESSION: Right arm PICC line is unchanged in position. Persistent mild perihilar and mild infrahilar interstitial prominence without convincing pulmonary edema. No segmental infiltrates. No pleural effusion. Electronically Signed   By: Lahoma Crocker M.D.   On: 10/14/2015 12:36   Dg Chest Port 1 View  10/08/2015  CLINICAL DATA:  Confusion EXAM: PORTABLE CHEST 1 VIEW COMPARISON:  Portable chest x-ray of 10/06/2015 FINDINGS: The previously noted interstitial opacities primarily throughout the right lung have cleared. There is mild basilar volume loss right greater than left. Cardiomegaly is stable. The right PICC line tip overlies the lower SVC. No pneumothorax is seen. IMPRESSION: 1. Marked improvement in interstitial opacities particularly throughout the right lung. 2. Stable cardiomegaly with mild basilar volume loss. Electronically Signed   By: Ivar Drape M.D.   On:  10/08/2015 08:15   Dg Chest Port 1 View  10/06/2015  CLINICAL DATA:  CHF, STEMI, cardiogenic shock. EXAM: PORTABLE CHEST 1 VIEW COMPARISON:  Portable chest x-ray of October 05, 2015 FINDINGS: The lungs are adequately inflated. The pulmonary interstitial markings remain increased. The pulmonary vascularity remains engorged. The cardiac silhouette remains enlarged. There is no pleural effusion or pneumothorax. The right-sided PICC line tip projects over the distal third of the SVC. IMPRESSION: Persistent pulmonary interstitial edema little changed from yesterday's study. Cardiomegaly with pulmonary vascular congestion, stable. Electronically Signed   By: David  Martinique M.D.   On: 10/06/2015 07:27   Dg Chest Port 1 View  10/05/2015  CLINICAL DATA:  New PICC Line EXAM: PORTABLE CHEST 1 VIEW COMPARISON:  10/05/2015 FINDINGS: Interval placement RIGHT PICC line with tip in distal SVC. Stable cardiac silhouette. Mild interstitial/pulmonary edema similar. No pneumothorax. IMPRESSION: 1. RIGHT PICC line in good position. 2. Mild interstitial / pulmonary edema. Electronically Signed   By: Suzy Bouchard M.D.   On: 10/05/2015 10:01   Dg Chest Port 1 View  10/05/2015  CLINICAL DATA:  CHF, STEMI, cardiogenic shock. EXAM: PORTABLE CHEST 1 VIEW COMPARISON:  Portable chest x-ray dated October 04, 2015 FINDINGS: The lungs are  well-expanded. The pulmonary interstitial markings are less conspicuous today. The pulmonary vascularity is more distinct and less engorged. The cardiac silhouette remains enlarged. There is no significant pleural effusion. The retrocardiac region on the left is more dense however. There is calcification in the wall of the ascending aorta and aortic arch. IMPRESSION: Improved appearance of the pulmonary interstitium and pulmonary vascularity consistent with decreased CHF. Developing left lower lobe atelectasis. There is no pleural effusion or pneumothorax. Electronically Signed   By: David  Martinique  M.D.   On: 10/05/2015 07:32   Portable Chest X-ray 1 View  10/04/2015  CLINICAL DATA:  Acute onset of shortness of breath. Initial encounter. EXAM: PORTABLE CHEST 1 VIEW COMPARISON:  Chest radiograph performed 10/03/2015 FINDINGS: The lungs are well-aerated. Vascular congestion is noted. Increased interstitial markings raise concern for pulmonary edema. There is no evidence of pleural effusion or pneumothorax. The cardiomediastinal silhouette is mildly enlarged. No acute osseous abnormalities are seen. An intra-aortic balloon pump tip is noted 2 cm below the aortic knob, as expected. IMPRESSION: Vascular congestion and mild cardiomegaly. Increased interstitial markings raise concern for pulmonary edema, new from the prior study. Electronically Signed   By: Garald Balding M.D.   On: 10/04/2015 00:44   Filed Vitals:   10/18/15 1034 10/18/15 1255 10/18/15 2009 10/19/15 0505  BP: 100/55 104/59 90/38 94/47   Pulse: 71 62 60 83  Temp:  98 F (36.7 C) 98.2 F (36.8 C) 97.4 F (36.3 C)  TempSrc:  Axillary Oral Axillary  Resp:  17 20 20   Height:      Weight:      SpO2:  95% 97% 96%     PHYSICAL EXAM General: NAD. Sitting on edge of ped with PCD Neck: JVP 6-7cm. No thyromegaly or thyroid nodule.  Lungs: Lungs diminished.  CV: Heart regular S1/S2, no S3/S4, no murmur.  No peripheral edema.  Pacer site clean, dry, and intact.  Abdomen: Soft, NT, ND, no HSM. No bruits or masses. +BS  Neurologic: Alert and oriented x3   Psych: Oriented to self. Normal affect. Extremities: No clubbing or cyanosis. Dressing intact   TELEMETRY: Reviewed personally, Paced 60-70s.   ASSESSMENT AND PLAN: 82 yo with history of suspected prior silent MI was admitted with lateral STEMI/cardiogenic shock.  He had PCI to LCx/OM and IABP placement.  1. CAD: Lateral STEMI. LHC with culprit 99% LCx lesion, but also chronic occlusion of the LAD and 75% pRCA stenosis.  Now s/p DES to LCx/OM.   - Continue ASA 81 and ticagrelor.   - continue Atorvastatin 80 daily.  2. Acute systolic CHF/cardiogenic shock: IABP out 2/14 and norepinephrine off 2/15.  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 Off milrinone - Continue lasix 20 mg daily. Volume status seems ok. K 5.7 this am. Hold potassium.  - Continue lisinopril 2.5 mg daily, may have to hold if creatinine goes up again.   -  Continue carvedilol 3.125 bid -  Continue dig 0.125 mg daily. Level was ok 3. Atrial fibrillation: He had atrial fibrillation earlier in stay.Was on amio but stopped due to bradycardia. Now s/p PPM. Will leave amio off for now.  4. CKD stage 3: Creatinine 1.38  5. Delirium: Waxing and waning confusion.  6. ID: Sepsis due to UTI and R groin abscess (s/p I&D 2/17). - Found to have sepsis in setting UTI and R groin abscess.  - Wound cx with Group A strep. Off vanc. Stop zosyn. Continue keflex 500 mg every 8 hours x  6 days.   - Change wound packing with iodoform daily.  7. Deconditioning - Worked with PT/OT in house.  To SNF on d/c 8. Suspected OSA.?  - Could consider need formal sleep study as outpatient.  9. Asystole - s/p PPM on Friday 10. Hyperkalemia - Hold K. Slightly hemolyzed at 5.7. Will recheck.  May need dose of kayexalate.  11. DNR/DNI  Palliative care to discuss further with family this am. Getting low dose IV morphine prn for CP/dyspnea.   Mostly stable for d/c today apart from Hyperkalemia.  Repeat BMET as below.    Shirley Friar PA-C 10/19/2015  9:50 AM   Advanced Heart Failure Team Pager [phone] (M-F; 7a - 4p)  Please contact [facility] for night-coverage after hours (4p -7a ) and weekends on [email or web address]  Patient seen with PA, agree with the above note.  Repeat K not elevated.  Suspect hemolysis.  To SNF tomorrow.   Sharrod Achille [employer]

## 2015-10-19 NOTE — Progress Notes (Signed)
Physical Therapy Treatment Patient Details Name: Chad French MRN: [id number] DOB: 09-29-1921 Today's Date: 10/19/2015    History of Present Illness 82 yo with history of suspected prior silent MI was admitted with lateral STEMI/cardiogenic shock. He had PCI to LCx/OM and IABP placement.     PT Comments    Pt performed increased mobility advancing from bed to chair.  Pt required +2 physical assistance and demonstrated posterior lean in sitting and standing.  Pt able to correct sitting balance with mod Verbal cues for forward weight shifting.    Follow Up Recommendations  SNF;Supervision/Assistance - 24 hour     Equipment Recommendations   (SNF)    Recommendations for Other Services       Precautions / Restrictions Precautions Precautions: Fall Restrictions Weight Bearing Restrictions: No    Mobility  Bed Mobility Overal bed mobility: Needs Assistance;+2 for physical assistance Bed Mobility: Supine to Sit     Supine to sit: Min assist;Mod assist;+2 for physical assistance     General bed mobility comments: Pt able to follow commands to advance LEs to edge of bed with +1 mod assist required +2 assist for trunk control to sit edge of bed.    Transfers Overall transfer level: Needs assistance Equipment used: 2 person hand held assist Transfers: Sit to/from Stand Sit to Stand: Mod assist;+2 physical assistance   Squat pivot transfers: Mod assist;+2 physical assistance     General transfer comment: Pt performed sit to stand with decreased hip extension required increased cueing for hip extension before advancing steps from bed to chair.  pt performed short shuffling steps with poor foot clearance from bed to chair.    Ambulation/Gait Ambulation/Gait assistance:  (remains unable to advance gait secondary to decreased balance and fatigue.  )               Stairs            Wheelchair Mobility    Modified Rankin (Stroke Patients Only)        Balance                                    Cognition Arousal/Alertness: Awake/alert Behavior During Therapy: WFL for tasks assessed/performed Overall Cognitive Status: Within Functional Limits for tasks assessed Area of Impairment: Following commands;Safety/judgement;Awareness;Problem solving;Attention   Current Attention Level: Selective (falling asleep during room ste-up and bed mobility, once sitting attention improved and pt remained focused.  )   Following Commands: Follows one step commands consistently;Follows one step commands with increased time Safety/Judgement: Decreased awareness of safety;Decreased awareness of deficits Awareness: Intellectual Problem Solving: Difficulty sequencing;Requires verbal cues;Slow processing;Requires tactile cues;Decreased initiation General Comments: pt with difficulty following commands to participate in PT session, requires max cuing for attention to task, motor planning    Exercises General Exercises - Lower Extremity Long Arc Quad: AAROM;Both;Seated;10 reps Heel Slides: AAROM;Both;Supine;10 reps Hip ABduction/ADduction: AAROM;Seated;Both;10 reps Straight Leg Raises: AAROM;Both;Supine;10 reps Hip Flexion/Marching: AAROM;Both;Seated;10 reps    General Comments        Pertinent Vitals/Pain Pain Assessment: No/denies pain Faces Pain Scale: No hurt    Home Living                      Prior Function            PT Goals (current goals can now be found in the care plan section) Acute Rehab PT Goals Patient Stated  Goal: to go home PT Goal Formulation: With patient Potential to Achieve Goals: Good Progress towards PT goals: Progressing toward goals    Frequency  Min 3X/week    PT Plan Current plan remains appropriate    Co-evaluation             End of Session Equipment Utilized During Treatment: Gait belt;Oxygen Activity Tolerance: Patient limited by fatigue;Patient limited by lethargy Patient  left: in chair;with chair alarm set;with call bell/phone within reach     Time: 0950-1005 PT Time Calculation (min) (ACUTE ONLY): 15 min  Charges:  $Therapeutic Activity: 8-22 mins                    G Codes:      Cristela Blue 2015-11-12, 10:22 AM  Governor Rooks, PTA pager [phone]

## 2015-10-19 NOTE — Progress Notes (Signed)
Daily Progress Note   Patient Name: Chad French       Date: 10/19/2015 DOB: August 01, 1922  Age: 82 y.o. MRN#: [id number] Attending Physician: Peter M Martinique, MD Primary Care Physician: Kandice Hams, MD Admit Date: 10/03/2015  Reason for Consultation/Follow-up: Establishing goals of care  Subjective:  some what more awake alert than yesterday am. Overall with generalized weakness, declining oral intake, waxing and waning mental status at times.   Family meeting:  met with the patient's daughter [name] and the patient's grand daughter at the bedside.  Patient's wife is noted to be a frail elderly lady, with cognitive decline and worsening physical health as well. It is reported that the patient was her primary caregiver.  This is an acute decline for the patient. Discussed about the various cardiac conditions complicating the patient's care. Hospitalization course reviewed.  DNR DNI Continue with symptom management Ok with d/c to [facility] place with palliative services follow up, patient to transition to full hospice support after his rehab attempt.  All questions answered to the best of my ability. Family had questions regarding antibiotics use, his UTI, groin abscess etc.   Length of Stay: 16 days  Current Medications: Scheduled Meds:  . antiseptic oral rinse  7 mL Mouth Rinse BID  . aspirin EC  81 mg Oral Daily  . atorvastatin  80 mg Oral q1800  . carvedilol  3.125 mg Oral BID WC  . cephALEXin  500 mg Oral 3 times per day  . digoxin  0.125 mg Oral Daily  . feeding supplement (GLUCERNA SHAKE)  237 mL Oral TID BM  . furosemide  20 mg Oral Daily  . insulin aspart  0-15 Units Subcutaneous TID WC  . lisinopril  2.5 mg Oral Daily  . sodium chloride flush  3 mL Intravenous Q12H    . ticagrelor  90 mg Oral BID    Continuous Infusions:    PRN Meds: sodium chloride, acetaminophen, HYDROcodone-acetaminophen, ipratropium-albuterol, morphine injection, neomycin-bacitracin-polymyxin, nitroGLYCERIN, ondansetron (ZOFRAN) IV, sodium chloride flush  Physical Exam: Physical Exam             Frail elderly gentleman  Mild distress S1 S2 Diminished Abdomen soft No edema  Vital Signs: BP 94/47 mmHg  Pulse 83  Temp(Src) 97.4 F (36.3 C) (Axillary)  Resp 20  Ht 5'  7" (1.702 m)  Wt 67.586 kg (149 lb)  BMI 23.33 kg/m2  SpO2 96% SpO2: SpO2: 96 % O2 Device: O2 Device: Nasal Cannula O2 Flow Rate: O2 Flow Rate (L/min): 3 L/min  Intake/output summary:  Intake/Output Summary (Last 24 hours) at 10/19/15 1301 Last data filed at 10/19/15 5852  Gross per 24 hour  Intake    160 ml  Output    521 ml  Net   -361 ml   LBM: Last BM Date: 10/19/15 Baseline Weight: Weight: 76 kg (167 lb 8.8 oz) Most recent weight: Weight: 67.586 kg (149 lb)       Palliative Assessment/Data: Flowsheet Rows        Most Recent Value   Intake Tab    Referral Department  Cardiology   Unit at Time of Referral  Cardiac/Telemetry Unit   Palliative Care Primary Diagnosis  Cardiac   Date Notified  10/16/15   Palliative Care Type  Return patient Palliative Care   Reason for referral  Clarify Goals of Care   Date of Admission  10/03/15   Date first seen by Palliative Care  10/19/15   # of days Palliative referral response time  2 Day(s)   # of days IP prior to Palliative referral  13   Clinical Assessment    Palliative Performance Scale Score  30%   Pain Max last 24 hours  6   Pain Min Last 24 hours  5   Dyspnea Max Last 24 Hours  5   Dyspnea Min Last 24 hours  5   Psychosocial & Spiritual Assessment    Palliative Care Outcomes    Patient/Family meeting held?  Yes   Who was at the meeting?  daughter [name] and grand daughter    [place] goals of care    Palliative Care follow-up planned  Yes, Facility      Additional Data Reviewed: CBC    Component Value Date/Time   WBC 11.1* 10/16/2015 0415   RBC 3.24* 10/16/2015 0415   HGB 10.5* 10/16/2015 0415   HCT 32.6* 10/16/2015 0415   PLT 479* 10/16/2015 0415   MCV 100.6* 10/16/2015 0415   MCH 32.4 10/16/2015 0415   MCHC 32.2 10/16/2015 0415   RDW 13.7 10/16/2015 0415   LYMPHSABS 1.5 10/03/2015 1856   MONOABS 2.3* 10/03/2015 1856   EOSABS 0.0 10/03/2015 1856   BASOSABS 0.0 10/03/2015 1856    CMP     Component Value Date/Time   NA 150* 10/19/2015 0600   K 5.7* 10/19/2015 0600   CL 113* 10/19/2015 0600   CO2 20* 10/19/2015 0600   GLUCOSE 119* 10/19/2015 0600   BUN 30* 10/19/2015 0600   CREATININE 1.38* 10/19/2015 0600   CALCIUM 8.9 10/19/2015 0600   PROT 6.8 10/03/2015 1856   ALBUMIN 3.9 10/03/2015 1856   AST 359* 10/03/2015 1856   ALT 76* 10/03/2015 1856   ALKPHOS 64 10/03/2015 1856   BILITOT 1.0 10/03/2015 1856   GFRNONAA 43* 10/19/2015 0600   GFRAA 50* 10/19/2015 0600       Problem List:  Patient Active Problem List   Diagnosis Date Noted  . Confusion   . Dyspnea   . Encounter for palliative care   . Second degree Mobitz II AV block   . Acute systolic heart failure ([place])   . Abscess of right groin 10/10/2015  . UTI (urinary tract infection) 10/10/2015  . PAF (paroxysmal atrial fibrillation) ([place]) 10/10/2015  . Physical deconditioning 10/10/2015  .  CHF (congestive heart failure) ([place])   . Cardiogenic shock ([place])   . STEMI (ST elevation myocardial infarction) ([place]) 10/03/2015  . Disequilibrium 07/24/2015  . Old MI (myocardial infarction) 06/12/2014  . Cardiomyopathy, ischemic 06/12/2014  . Essential hypertension 06/12/2014  . Candidal skin infection 12/04/2013  . Generalized weakness 12/04/2013  . CAP (community acquired pneumonia) 11/27/2013  . Febrile illness 11/27/2013  . GI bleed 01/06/2012  . Anemia 01/06/2012  . Diverticulitis   . HTN  (hypertension)   . HLD (hyperlipidemia)   . Degenerative disc disease   . Diabetes mellitus ([place])   . Obesity, Class III, BMI 40-49.9 (morbid obesity) ([place])   . Glaucoma      Palliative Care Assessment & Plan    1.Code Status:  DNR    Code Status Orders        Start     Ordered   10/16/15 1733  Do not attempt resuscitation (DNR)   Continuous    Question Answer Comment  In the event of cardiac or respiratory ARREST Do not call a "code blue"   In the event of cardiac or respiratory ARREST Do not perform Intubation, CPR, defibrillation or ACLS   In the event of cardiac or respiratory ARREST Use medication by any route, position, wound care, and other measures to relive pain and suffering. May use oxygen, suction and manual treatment of airway obstruction as needed for comfort.      10/16/15 1733    Code Status History    Date Active Date Inactive Code Status Order ID Comments User Context   10/04/2015 12:14 AM 10/16/2015  5:33 PM Full Code [id number]  Nelva Bush, MD Inpatient   10/03/2015  8:53 PM 10/04/2015 12:14 AM DNR [id number]  Leo Grosser, MD ED   12/14/2013  9:41 PM 10/03/2015  8:53 PM Full Code [id number]  Hennie Duos, MD Outpatient   11/27/2013  5:52 PM 12/02/2013  3:39 PM Full Code [id number]  Bonnielee Haff, MD ED       2. Goals of Care/Additional Recommendations:  Desire for further Chaplaincy support:no  Psycho-social Needs: Caregiving  Support/Resources  3. Symptom Management:      1. Morphine IV PRN  4. Palliative Prophylaxis:   Delirium Protocol  5. Prognosis: ? Weeks   6. Discharge Planning:  [place] for rehab with Palliative care service follow-up : discussed with case manager [place] was discussed with  Patient, daughter [name], grand daughter and case management.   Thank you for allowing the Palliative Medicine Team to assist in the care of this patient.   Time In: 0930 Time Out: 1005 Total Time 35 Prolonged  Time Billed  no        [phone]  Loistine Chance, MD  10/19/2015, 1:01 PM  Please contact Palliative Medicine Team phone at [phone] for questions and concerns.

## 2015-10-19 NOTE — Progress Notes (Signed)
Repeat K 4.9.  Stopped K supp. No further action needed.  Will follow with am BMET.    Legrand Como [address]" [place], PA-C 10/19/2015 3:11 PM

## 2015-10-20 LAB — BASIC METABOLIC PANEL
ANION GAP: 11 (ref 5–15)
BUN: 30 mg/dL — ABNORMAL HIGH (ref 6–20)
CALCIUM: 8.8 mg/dL — AB (ref 8.9–10.3)
CO2: 24 mmol/L (ref 22–32)
Chloride: 111 mmol/L (ref 101–111)
Creatinine, Ser: 1.32 mg/dL — ABNORMAL HIGH (ref 0.61–1.24)
GFR calc Af Amer: 52 mL/min — ABNORMAL LOW (ref >60)
GFR calc non Af Amer: 45 mL/min — ABNORMAL LOW (ref >60)
GLUCOSE: 113 mg/dL — AB (ref 65–99)
Potassium: 4.7 mmol/L (ref 3.5–5.1)
Sodium: 146 mmol/L — ABNORMAL HIGH (ref 135–145)

## 2015-10-20 LAB — DIGOXIN LEVEL: Digoxin Level: 1 ng/mL (ref 0.8–2.0)

## 2015-10-20 LAB — GLUCOSE, CAPILLARY
Glucose-Capillary: 88 mg/dL (ref 65–99)
Glucose-Capillary: 122 mg/dL — ABNORMAL HIGH (ref 65–99)

## 2015-10-20 MED ORDER — CARVEDILOL 3.125 MG PO TABS: 3.1250 mg | ORAL_TABLET | Freq: Two times a day (BID) | ORAL | Status: AC

## 2015-10-20 MED ORDER — DIGOXIN 62.5 MCG PO TABS: 0.0625 mg | ORAL_TABLET | Freq: Every day | ORAL | Status: DC

## 2015-10-20 MED ORDER — BACITRACIN-NEOMYCIN-POLYMYXIN OINTMENT TUBE: 1.0000 "application " | TOPICAL_OINTMENT | Freq: Every day | CUTANEOUS | Status: DC

## 2015-10-20 MED ORDER — DIGOXIN 125 MCG PO TABS: 0.0625 mg | ORAL_TABLET | Freq: Every day | ORAL | Status: DC

## 2015-10-20 NOTE — Progress Notes (Signed)
Discharge Note:  Patient drowsy and oriented X 3.  Patient in no distress. Family at bedside. EMS at bedside to transport the patient to [facility].  Patient information packet given to EMTs. After visit summary given to family member.  Peripheral IV and telemetry discontinued.  Family confirmed that they had all of patient's personal belongings. Attempted to call report to [facility]. Left voicemail for supervisor to call me back for report.

## 2015-10-20 NOTE — Care Management Important Message (Signed)
Important Message  Patient Details  Name: Chad French MRN: [id number] Date of Birth: 13-Dec-1921   Medicare Important Message Given:  Yes    Shaleena Crusoe [place] 10/20/2015, 3:20 PM

## 2015-10-20 NOTE — Progress Notes (Signed)
Patient ID: Chad French, male   DOB: [birth year], 82 y.o.   MRN: [id number]   SUBJECTIVE:    Alert/oriented this morning.  Creatinine stable at 1.3, BP stable.  Says he feels good.  Scheduled Meds: . antiseptic oral rinse  7 mL Mouth Rinse BID  . aspirin EC  81 mg Oral Daily  . atorvastatin  80 mg Oral q1800  . carvedilol  3.125 mg Oral BID WC  . [START ON 10/21/2015] digoxin  0.0625 mg Oral Daily  . feeding supplement (GLUCERNA SHAKE)  237 mL Oral TID BM  . furosemide  20 mg Oral Daily  . insulin aspart  0-15 Units Subcutaneous TID WC  . lisinopril  2.5 mg Oral Daily  . sodium chloride flush  3 mL Intravenous Q12H  . ticagrelor  90 mg Oral BID   Continuous Infusions:   PRN Meds:.sodium chloride, acetaminophen, HYDROcodone-acetaminophen, ipratropium-albuterol, morphine injection, neomycin-bacitracin-polymyxin, nitroGLYCERIN, ondansetron (ZOFRAN) IV, sodium chloride flush    Filed Vitals:   10/19/15 1433 10/19/15 1434 10/19/15 2105 10/20/15 0434  BP:  96/41 101/54 97/61  Pulse: 61 80 90 78  Temp:  98.4 F (36.9 C) 98.7 F (37.1 C) 97.8 F (36.6 C)  TempSrc:  Axillary Axillary Axillary  Resp:  19 19 19   Height:      Weight:      SpO2: 99% 99% 99% 99%    Intake/Output Summary (Last 24 hours) at 10/20/15 0853 Last data filed at 10/20/15 0654  Gross per 24 hour  Intake    340 ml  Output   1150 ml  Net   -810 ml   LABS: Basic Metabolic Panel:  Recent Labs  10/19/15 1224 10/20/15 0633  NA 145 146*  K 4.9 4.7  CL 110 111  CO2 26 24  GLUCOSE 129* 113*  BUN 27* 30*  CREATININE 1.35* 1.32*  CALCIUM 8.8* 8.8*   Liver Function Tests: No results for input(s): AST, ALT, ALKPHOS, BILITOT, PROT, ALBUMIN in the last 72 hours. No results for input(s): LIPASE, AMYLASE in the last 72 hours. CBC: No results for input(s): WBC, NEUTROABS, HGB, HCT, MCV, PLT in the last 72 hours. Cardiac Enzymes: No results for input(s): CKTOTAL, CKMB, CKMBINDEX, TROPONINI in the  last 72 hours. BNP: Invalid input(s): POCBNP D-Dimer: No results for input(s): DDIMER in the last 72 hours. Hemoglobin A1C: No results for input(s): HGBA1C in the last 72 hours. Fasting Lipid Panel: No results for input(s): CHOL, HDL, LDLCALC, TRIG, CHOLHDL, LDLDIRECT in the last 72 hours. Thyroid Function Tests: No results for input(s): TSH, T4TOTAL, T3FREE, THYROIDAB in the last 72 hours.  Invalid input(s): FREET3 Anemia Panel: No results for input(s): VITAMINB12, FOLATE, FERRITIN, TIBC, IRON, RETICCTPCT in the last 72 hours.  Filed Vitals:   10/19/15 1433 10/19/15 1434 10/19/15 2105 10/20/15 0434  BP:  96/41 101/54 97/61  Pulse: 61 80 90 78  Temp:  98.4 F (36.9 C) 98.7 F (37.1 C) 97.8 F (36.6 C)  TempSrc:  Axillary Axillary Axillary  Resp:  19 19 19   Height:      Weight:      SpO2: 99% 99% 99% 99%     PHYSICAL EXAM General: NAD.  Neck: JVP 6-7cm. No thyromegaly or thyroid nodule.  Lungs: Lungs diminished.  CV: Heart regular S1/S2, no S3/S4, no murmur.  No peripheral edema.  Pacer site clean, dry, and intact.  Abdomen: Soft, NT, ND, no HSM. No bruits or masses. +BS  Neurologic: Alert and oriented x3  Psych: Oriented to self. Normal affect. Extremities: No clubbing or cyanosis. Dressing intact   TELEMETRY: Reviewed personally, a-paced/v-sensed   ASSESSMENT AND PLAN: 82 yo with history of suspected prior silent MI was admitted with lateral STEMI/cardiogenic shock.  He had PCI to LCx/OM and IABP placement.  1. CAD: Lateral STEMI. LHC with culprit 99% LCx lesion, but also chronic occlusion of the LAD and 75% pRCA stenosis.  Now s/p DES to LCx/OM.   - Continue ASA 81 and ticagrelor.  - continue Atorvastatin 80 daily.  2. Acute systolic CHF/cardiogenic shock: IABP out 2/14 and norepinephrine off 2/15.  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 Off milrinone - Continue lasix 20 mg daily. Volume status seems ok.  - Continue lisinopril 2.5 mg daily.   - Continue  carvedilol 3.125 bid - Decrease digoxin to 0.0625 daily with level 1.0.  3. Atrial fibrillation: He had atrial fibrillation earlier in stay. Was on amio but stopped due to bradycardia. Now s/p PPM. Will leave amio off for now.  4. CKD stage 3: Creatinine stable today.  5. Delirium: Waxing and waning confusion, clear today.  6. ID: Sepsis due to UTI and R groin abscess (s/p I&D 2/17). - Found to have sepsis in setting UTI and R groin abscess.  - Wound cx with Group A strep. Off vanc. Stop zosyn. Continue keflex 500 mg every 8 hours x  6 days.  - Change wound packing with iodoform daily.  7. Deconditioning: Worked with PT/OT in house.  To SNF on d/c 8. Suspected OSA: ?Could consider need formal sleep study as outpatient.  9. Asystole: s/p PPM on Friday, currently a-pacing. 10. DNR/DNI  Disposition: May go to SNF today for rehab with palliative services.  May want hospice care after leaving rehab.  Meds for home: Complete Keflex course, Lasix 20 daily, digoxin 0.0625 daily, atorvastatin 80 daily, lisinopril 2.5 daily, Coreg 3.125 bid, ASA 81 daily, ticagrelor 90 bid.  Should get BMET in 1 week and followup CHF clinic < 2 wks.   Loralie Champagne 10/20/2015 9:00 AM

## 2015-10-20 NOTE — Care Management Note (Signed)
Case Management Note Previous CM note initiated by Donne Anon RN, CM  Patient Details  Name: Chad French MRN: [id number] Date of Birth: March 08, 1922  Subjective/Objective:     Adm w mi               Action/Plan: lives w wife, pcp dr polite   Expected Discharge Date:    10/20/15             Expected Discharge Plan:  [place]  In-House Referral:  Clinical Social Work  Discharge planning Services  CM Consult, Medication Assistance  Post Acute Care Choice:  NA Choice offered to:  NA  DME Arranged:    DME Agency:     HH Arranged:    [place] Agency:     Status of Service:  Completed, signed off  Medicare Important Message Given:  Yes Date Medicare IM Given:    Medicare IM give by:    Date Additional Medicare IM Given:    Additional Medicare Important Message give by:     If discussed at [place] of Stay Meetings, dates discussed:    Discharge Disposition: Skilled Facility   Additional Comments: gave pt 30day free brilinta card  10/20/15- Marvetta Gibbons RN, BSN- pt had to have emergent PPM on 10/16/15 prior to d/c to SNF- pt now stable for discharge plan for PC to follow at SNF per family request. CSW following for placement needs.  10/16/15- Marvetta Gibbons RN, BSN- pt will be discharging to [facility]- CSW following for d/c needs- Health Dept called wife regarding infection- contact at HD is Bethena [phone]- [place] has info and has f/u with HD regarding issue. No further CM needs  Dawayne Patricia, RN 10/20/2015, 10:17 AM

## 2015-10-20 NOTE — Progress Notes (Signed)
Patient will DC to: [facility] Anticipated DC date: 10/20/1775 Family notified: Daughter Transport by: PTAR  CSW signing off.  Cedric Fishman, [place] Social Worker [phone]

## 2015-10-20 NOTE — Clinical Social Work Placement (Signed)
   CLINICAL SOCIAL WORK PLACEMENT  NOTE  Date:  10/20/2015  Patient Details  Name: Chad French MRN: [id number] Date of Birth: 02-06-1922  Clinical Social Work is seeking post-discharge placement for this patient at the [place] level of care (*CSW will initial, date and re-position this form in  chart as items are completed):  Yes   Patient/family provided with [place] Work Department's list of facilities offering this level of care within the geographic area requested by the patient (or if unable, by the patient's family).  Yes   Patient/family informed of their freedom to choose among providers that offer the needed level of care, that participate in Medicare, Medicaid or managed care program needed by the patient, have an available bed and are willing to accept the patient.  Yes   Patient/family informed of [facility]'s ownership interest in [facility] and [facility], as well as of the fact that they are under no obligation to receive care at these facilities.  PASRR submitted to EDS on 10/10/15     PASRR number received on 10/10/15     Existing PASRR number confirmed on 10/10/15     FL2 transmitted to all facilities in geographic area requested by pt/family on 10/10/15     FL2 transmitted to all facilities within larger geographic area on       Patient informed that his/her managed care company has contracts with or will negotiate with certain facilities, including the following:        Yes   Patient/family informed of bed offers received.  Patient chooses bed at [facility]     Physician recommends and patient chooses bed at      Patient to be transferred to [facility] on 10/20/15.  Patient to be transferred to facility by PTAR     Patient family notified on 10/20/15 of transfer.  Name of family member notified:  Daughter     PHYSICIAN       Additional Comment:     _______________________________________________ Benard Halsted, [place] 10/20/2015, 10:55 AM

## 2015-10-20 NOTE — Discharge Summary (Addendum)
Advanced Heart Failure Discharge Note   Discharge Summary   Patient ID: Chad French MRN: [id number], DOB/AGE: 01-06-1922 82 y.o. Admit date: 10/03/2015 D/C date:     10/20/2015   Primary Discharge Diagnoses:  1. CAD: Lateral STEMI. LHC with culprit 99% LCx lesion, but also chronic occlusion of the LAD and 75% pRCA stenosis. s/p DES to LCX/OM 2. Acute systolic CHF/cardiogenic shock: IABP out 2/14 and norepinephrine off 2/15. 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 Off milrinone 3. Atrial fibrillation 4. CKD stage 3 5. Delirium 6. ID: Sepsis due to UTI and R groin abscess (s/p I&D 2/17). - Wound cx with Group A strep. Continue keflex 500 mg every 8 hours x 6 days.  - Change wound packing with iodoform daily.  7. Deconditioning - To SNF 8. Dyspnea - Stable. Prn nebs.  9. Suspected OSA 10. Mobitz II  2nd degree AV block with 20 second pause s/p pacemaker placement  Consults: EP, Palliative Care, [place] Hospital Course:   Chad French is a 82 y.o. male with history of suspected prior silent MI admitted 10/03/15 with lateral STEMI and cardiogenic shock. He had PCI with DES LCx/OM and decompensated during catheterization prompting placement of IABP. LVEDP 30 on cath, also treated for volume overload with IV lasix.   Pt did briefly go into afib 10/05/15, thought to be 2/2 milrinone/norepi. Spontaneously converted. Placed on 200 mg amiodoraone BID to maintain NSR while on pressors.   Pt gradually improved and drips gradually titrated off. IABP removed 2/14 and norepinephrine stopped 2/15. Milrinone stopped 2/17 with stable mixed venous sats.  Hospital course complicated by suspected sepsis with fever to 101.6 and increased white count on 2/17. Noted to have developed R groin abscess at IABP site. Placed on vanc/zosyn empirically. Narrowed to Cephalexin with sensitivities. S/p I/D 2/17 with wound packing with iodoform guaze dressing. Wound culture was  positive for Group A Strep.   Additional complications include intermittent delirium. Did not seem to be sundowning or directly related to his sepsis above. He was more stable on days his family visited. Thought to be mostly due to hospital/ICU delirium.   He was noted to have episodes of apnea overnight on telemetry. Suspect sleep apnea and will make arrangements for a sleep study once we see as outpatient.   He finished course of Keflex in hospital. Will need daily dressing changes/wound checks of right groin with daily replacement of iodoform guaze until healed. Can also use Neosporin ointment.   Prior to expected discharge on 10/16/15 pt had a 20 second RR interval today with stokes adams events. No reversible causes were found. He was therefore sent urgently for PPM implant by Dr. Rayann Heman.  He underwent the procedure without complications.    He was again held for discharge on 10/19/15 with high K, which was slightly hemolyzed. It was thought best to observe for one more day to trend electrolytes and kidney function.   I/Os inaccurate. Weight down ~18 lbs from highest weight this admission.   Pt will be discharged to SNF in stable condition. He has close follow up in the HF clinic as below.   Discharge Weight Range: 149 lbs Discharge Vitals: Blood pressure 97/61, pulse 78, temperature 97.8 F (36.6 C), temperature source Axillary, resp. rate 19, height 5\' 7"  (1.702 m), weight 149 lb (67.586 kg), SpO2 99 %.  Labs: Lab Results  Component Value Date   WBC 11.1* 10/16/2015   HGB 10.5* 10/16/2015   HCT 32.6*  10/16/2015   MCV 100.6* 10/16/2015   PLT 479* 10/16/2015     Recent Labs Lab 10/20/15 0633  NA 146*  K 4.7  CL 111  CO2 24  BUN 30*  CREATININE 1.32*  CALCIUM 8.8*  GLUCOSE 113*   Lab Results  Component Value Date   CHOL 151 10/04/2015   HDL 39* 10/04/2015   LDLCALC 95 10/04/2015   TRIG 84 10/04/2015   BNP (last 3 results) No results for input(s): BNP in the  last 8760 hours.  ProBNP (last 3 results) No results for input(s): PROBNP in the last 8760 hours.   Diagnostic Studies/Procedures   No results found.  Discharge Medications     Medication List    TAKE these medications        acetaminophen 325 MG tablet  Commonly known as:  TYLENOL  Take 2 tablets (650 mg total) by mouth every 6 (six) hours as needed for mild pain or headache.     aspirin 81 MG EC tablet  Take 1 tablet (81 mg total) by mouth daily.     atorvastatin 80 MG tablet  Commonly known as:  LIPITOR  Take 1 tablet (80 mg total) by mouth daily at 6 PM.     carvedilol 3.125 MG tablet  Commonly known as:  COREG  Take 1 tablet (3.125 mg total) by mouth 2 (two) times daily with a meal.     cholecalciferol 400 units Tabs tablet  Commonly known as:  VITAMIN D  Take 400 Units by mouth daily.     Digoxin 62.5 MCG Tabs  Take 0.0625 mg by mouth daily.  Start taking on:  10/21/2015     furosemide 20 MG tablet  Commonly known as:  LASIX  Take 1 tablet (20 mg total) by mouth daily.     lisinopril 2.5 MG tablet  Commonly known as:  PRINIVIL,ZESTRIL  Take 1 tablet (2.5 mg total) by mouth daily.     neomycin-bacitracin-polymyxin Oint  Commonly known as:  NEOSPORIN  Apply 1 application topically daily.     nitroGLYCERIN 0.4 MG SL tablet  Commonly known as:  NITROSTAT  Place 1 tablet (0.4 mg total) under the tongue every 5 (five) minutes x 3 doses as needed for chest pain.     ticagrelor 90 MG Tabs tablet  Commonly known as:  BRILINTA  Take 1 tablet (90 mg total) by mouth 2 (two) times daily.        Disposition   The patient will be discharged in stable condition to SNF   Discharge Instructions    Diet - low sodium heart healthy    Complete by:  As directed      Diet - low sodium heart healthy    Complete by:  As directed      Heart Failure patients record your daily weight using the same scale at the same time of day    Complete by:  As directed      Heart  Failure patients record your daily weight using the same scale at the same time of day    Complete by:  As directed      Increase activity slowly    Complete by:  As directed      Increase activity slowly    Complete by:  As directed           Follow-up Information    Follow up with Loralie Champagne, MD On 10/26/2015.   Specialty:  Cardiology   Why:  at 1030 for post hospital follow up. Please bring all of your medications to your visit. The code for patient parking is 0010.   Contact information:   [address] [place] [address] [phone]       Follow up with [facility].   Specialty:  [place] information:   [place] [phone]        Duration of Discharge Encounter: Greater than 35 minutes   Signed, Shirley Friar PA-C 10/20/2015, 9:49 AM

## 2015-10-21 ENCOUNTER — Encounter: Payer: Self-pay | Admitting: Internal Medicine

## 2015-10-21 ENCOUNTER — Non-Acute Institutional Stay (SKILLED_NURSING_FACILITY): Payer: Medicare Other | Admitting: Internal Medicine

## 2015-10-21 DIAGNOSIS — R5381 Other malaise: Secondary | ICD-10-CM

## 2015-10-21 DIAGNOSIS — I5020 Unspecified systolic (congestive) heart failure: Secondary | ICD-10-CM

## 2015-10-21 DIAGNOSIS — N183 Chronic kidney disease, stage 3 unspecified: Secondary | ICD-10-CM

## 2015-10-21 DIAGNOSIS — D72829 Elevated white blood cell count, unspecified: Secondary | ICD-10-CM | POA: Diagnosis not present

## 2015-10-21 DIAGNOSIS — I2121 ST elevation (STEMI) myocardial infarction involving left circumflex coronary artery: Secondary | ICD-10-CM | POA: Diagnosis not present

## 2015-10-21 DIAGNOSIS — E785 Hyperlipidemia, unspecified: Secondary | ICD-10-CM

## 2015-10-21 DIAGNOSIS — I441 Atrioventricular block, second degree: Secondary | ICD-10-CM

## 2015-10-21 DIAGNOSIS — I480 Paroxysmal atrial fibrillation: Secondary | ICD-10-CM | POA: Diagnosis not present

## 2015-10-21 DIAGNOSIS — L02214 Cutaneous abscess of groin: Secondary | ICD-10-CM | POA: Diagnosis not present

## 2015-10-21 DIAGNOSIS — D62 Acute posthemorrhagic anemia: Secondary | ICD-10-CM | POA: Diagnosis not present

## 2015-10-21 NOTE — Progress Notes (Signed)
Patient ID: Chad French, male   DOB: 09-03-1921, 82 y.o.   MRN: [id number]    LOCATION: [facility]  PCP: Kandice Hams, MD   Code Status: DNR  Goals of care: Advanced Directive information Advanced Directives 10/21/2015  Does patient have an advance directive? Yes  Type of Advance Directive Out of facility DNR (pink MOST or yellow form)  Does patient want to make changes to advanced directive? No - Patient declined  Copy of advanced directive(s) in chart? Yes  Would patient like information on creating an advanced directive? -       Extended Emergency Contact Information Primary Emergency Contact: Adams,Susan Address: [address]          [place], [place] [address] [place] Phone: [phone] Mobile Phone: [phone] Relation: Daughter Secondary Emergency Contact: [name] Address: [address]            [place],  [address] [name] Phone: [phone] Relation: Spouse   No Known Allergies  Chief Complaint  Patient presents with  . Readmit To SNF    Readmission     HPI:  Patient is a 82 y.o. male seen today for short term rehabilitation post hospital admission from 10/03/15-10/20/15 with CAD. He was noted to have lateral STEMI. He underwent left heart catheterization showing 995 left circumflex stenosis with LAD and pRCA stenosis. He underwent PCI with DES to left circumflex and OM. He had acute systolic CHF and cardiogenic shock requiring pressor support and intra-aortic balloon pump. He required diuresis as well. He had afib and converted spontaneously. He was started on amiodarone to help maintain NSR. He had sepsis with right groin abscess at IABP site. He underwent I&D. Wound culture was positive for group A streptococcus. He was started on iv antibiotics. He also had post op delirium. He finished antibiotic in hospital. He then had bradycardia and underwent pacemaker placement. He is seen in his  room today. He is somewhat confused and is severely deconditioned. He denies any concern.   Review of Systems:  Constitutional: Negative for fever, chills. Positive for malaise HENT: Negative for headache, congestion, nasal discharge Eyes: Negative for blurred vision, double vision and discharge.  Respiratory: Negative for cough, shortness of breath and wheezing.   Cardiovascular: Negative for chest pain, palpitations, leg swelling.  Gastrointestinal: Negative for heartburn, nausea, vomiting, abdominal pain. Had bowel movement this am Genitourinary: Negative for dysuria Musculoskeletal: Negative for fall Skin: Negative for itching, rash.  Psychiatric/Behavioral: Negative for depression   Past Medical History  Diagnosis Date  . Diverticulitis     with colonoscopy in 11/2010-Diverticula and polyp whioch was a non-malignant tubuular adenoma  . HTN (hypertension)   . HLD (hyperlipidemia)   . Degenerative disc disease   . Diabetes mellitus ([place])   . Glaucoma    Past Surgical History  Procedure Laterality Date  . Colonoscopy  4/12  . Cardiac catheterization N/A 10/03/2015    Procedure: Left Heart Cath and Coronary Angiography;  Surgeon: Peter M Martinique, MD;  Location: [facility];  Service: Cardiovascular;  Laterality: N/A;  . Cardiac catheterization N/A 10/03/2015    Procedure: IABP Insertion;  Surgeon: Peter M Martinique, MD;  Location: [facility];  Service: Cardiovascular;  Laterality: N/A;  . Cardiac catheterization N/A 10/03/2015    Procedure: Coronary Stent Intervention;  Surgeon: Peter M Martinique, MD;  Location: [facility];  Service: Cardiovascular;  Laterality: N/A;  . Ep implantable device N/A  10/16/2015    Procedure: Pacemaker Implant;  Surgeon: [name], MD;  Location: [facility];  Service: Cardiovascular;  Laterality: N/A;   Social History:   reports that he quit smoking about 44 years ago. He does not have any smokeless tobacco history on file. He  reports that he does not drink alcohol or use illicit drugs.  Family History  Problem Relation Age of Onset  . Diabetes Mother   . Diabetes Maternal Grandmother   . Kidney disease Maternal Grandfather     Medications:   Medication List       This list is accurate as of: 10/21/15  5:41 PM.  Always use your most recent med list.               acetaminophen 325 MG tablet  Commonly known as:  TYLENOL  Take 2 tablets (650 mg total) by mouth every 6 (six) hours as needed for mild pain or headache.     aspirin 81 MG EC tablet  Take 1 tablet (81 mg total) by mouth daily.     atorvastatin 80 MG tablet  Commonly known as:  LIPITOR  Take 1 tablet (80 mg total) by mouth daily at 6 PM.     carvedilol 3.125 MG tablet  Commonly known as:  COREG  Take 1 tablet (3.125 mg total) by mouth 2 (two) times daily with a meal.     cholecalciferol 400 units Tabs tablet  Commonly known as:  VITAMIN D  Take 400 Units by mouth daily.     Digoxin 62.5 MCG Tabs  Take 0.0625 mg by mouth daily.     furosemide 20 MG tablet  Commonly known as:  LASIX  Take 1 tablet (20 mg total) by mouth daily.     lisinopril 2.5 MG tablet  Commonly known as:  PRINIVIL,ZESTRIL  Take 1 tablet (2.5 mg total) by mouth daily.     neomycin-bacitracin-polymyxin Oint  Commonly known as:  NEOSPORIN  Apply 1 application topically daily.     nitroGLYCERIN 0.4 MG SL tablet  Commonly known as:  NITROSTAT  Place 1 tablet (0.4 mg total) under the tongue every 5 (five) minutes x 3 doses as needed for chest pain.     ticagrelor 90 MG Tabs tablet  Commonly known as:  BRILINTA  Take 1 tablet (90 mg total) by mouth 2 (two) times daily.        Immunizations: Immunization History  Administered Date(s) Administered  . PPD Test 10/20/2015     Physical Exam: Filed Vitals:   10/21/15 1516  BP: 106/66  Pulse: 78  Temp: 98 F (36.7 C)  TempSrc: Oral  Resp: 18  Height: 5\' 7"  (1.702 m)  Weight: 163 lb (73.936 kg)    SpO2: 97%   Body mass index is 25.52 kg/(m^2).  General- elderly frail and ill appearing male, in no acute distress Head- normocephalic, atraumatic, right hearing aid Nose- no maxillary or frontal sinus tenderness, no nasal discharge Throat- moist mucus membrane Eyes- PERRLA, EOMI, no pallor, no icterus, no discharge, normal conjunctiva, normal sclera Neck- no cervical lymphadenopathy Cardiovascular- normal s1,s2, no leg edema Respiratory- bilateral clear to auscultation, no wheeze, no rhonchi, no crackles, no use of accessory muscles Abdomen- bowel sounds present, soft, non tender, has foley catheter Musculoskeletal- able to move all 4 extremities, generalized weakness Neurological- alert and oriented to person, place and time Skin- warm and dry, easy bruising Psychiatry- normal mood and affect    Labs reviewed: Basic  Metabolic Panel:  Recent Labs  10/05/15 0217  10/19/15 0600 10/19/15 1224 10/20/15 0633  NA 141  < > 150* 145 146*  K 4.0  < > 5.7* 4.9 4.7  CL 107  < > 113* 110 111  CO2 23  < > 20* 26 24  GLUCOSE 127*  < > 119* 129* 113*  BUN 34*  < > 30* 27* 30*  CREATININE 1.46*  < > 1.38* 1.35* 1.32*  CALCIUM 8.5*  < > 8.9 8.8* 8.8*  MG 2.0  --   --   --   --   < > = values in this interval not displayed. Liver Function Tests:  Recent Labs  10/03/15 1856  AST 359*  ALT 76*  ALKPHOS 64  BILITOT 1.0  PROT 6.8  ALBUMIN 3.9   No results for input(s): LIPASE, AMYLASE in the last 8760 hours.  Recent Labs  10/07/15 0945 10/13/15 1024  AMMONIA 19 21   CBC:  Recent Labs  10/03/15 1856  10/14/15 0400 10/15/15 0500 10/16/15 10/16/15 0415  WBC 16.8*  < > 12.6* 13.3* 11.1 11.1*  NEUTROABS 13.0*  --   --   --   --   --   HGB 14.8  < > 11.1* 10.0*  --  10.5*  HCT 45.2  < > 34.9* 31.5*  --  32.6*  MCV 98.9  < > 98.6 98.1  --  100.6*  PLT 170  < > 500* 465*  --  479*  < > = values in this interval not displayed. Cardiac Enzymes:  Recent Labs   10/04/15 0051 10/04/15 0509 10/04/15 1152  TROPONINI >65.00* >65.00* >65.00*   BNP: Invalid input(s): POCBNP CBG:  Recent Labs  10/19/15 2104 10/20/15 0631 10/20/15 1122  GLUCAP 113* 88 122*    Radiological Exams: Dg Chest 2 View  10/17/2015  CLINICAL DATA:  Post pacemaker insertion EXAM: CHEST  2 VIEW COMPARISON:  10/14/2015 FINDINGS: Left pacer is in place with leads in the right atrium and right ventricle. No pneumothorax. There is cardiomegaly with vascular congestion. Aortic calcifications. No confluent airspace opacities, effusions or edema. No acute bony abnormality. IMPRESSION: Left pacer placement without pneumothorax. Cardiomegaly with vascular congestion. Electronically Signed   By: Rolm Baptise M.D.   On: 10/17/2015 09:13   Dg Chest 2 View  10/03/2015  CLINICAL DATA:  Fall today and generalized weakness EXAM: CHEST  2 VIEW COMPARISON:  11/28/2013 FINDINGS: Cardiac shadow is mildly enlarged but stable. Aortic calcifications are again seen. The lungs are well aerated bilaterally. Mild interstitial changes are noted within both lungs. No focal confluent infiltrate is seen. No acute bony abnormality is noted. IMPRESSION: No acute abnormality noted. Electronically Signed   By: Inez Catalina M.D.   On: 10/03/2015 18:59   Ct Head Wo Contrast  10/08/2015  CLINICAL DATA:  Confusion.  History of hypertension and diabetes. EXAM: CT HEAD WITHOUT CONTRAST TECHNIQUE: Contiguous axial images were obtained from the base of the skull through the vertex without intravenous contrast. COMPARISON:  None. FINDINGS: No evidence for acute infarction, hemorrhage, mass lesion, hydrocephalus, or extra-axial fluid. Advanced cerebral and cerebellar atrophy. Extensive hypoattenuation of white matter, likely chronic microvascular ischemic change. Calvarium is intact. There is been previous ocular banding surgery. There is calcification of the skull base internal carotid arteries and distal BILATERAL vertebral  arteries. Minor chronic sinus disease. No mastoid fluid. IMPRESSION: Atrophy and small vessel disease.  No acute intracranial findings. Electronically Signed   By: Madie Reno  Curnes M.D.   On: 10/08/2015 14:59   Dg Chest Port 1 View  10/14/2015  CLINICAL DATA:  Multiple episodes of productive cough and dyspnea EXAM: PORTABLE CHEST 1 VIEW COMPARISON:  10/08/2015 FINDINGS: Cardiomegaly again noted. Atherosclerotic calcifications of thoracic aorta. Right arm PICC line is unchanged in position. Persistent mild perihilar and mild infrahilar interstitial prominence without convincing pulmonary edema. No segmental infiltrates. No pleural effusion. IMPRESSION: Right arm PICC line is unchanged in position. Persistent mild perihilar and mild infrahilar interstitial prominence without convincing pulmonary edema. No segmental infiltrates. No pleural effusion. Electronically Signed   By: Lahoma Crocker M.D.   On: 10/14/2015 12:36   Dg Chest Port 1 View  10/08/2015  CLINICAL DATA:  Confusion EXAM: PORTABLE CHEST 1 VIEW COMPARISON:  Portable chest x-ray of 10/06/2015 FINDINGS: The previously noted interstitial opacities primarily throughout the right lung have cleared. There is mild basilar volume loss right greater than left. Cardiomegaly is stable. The right PICC line tip overlies the lower SVC. No pneumothorax is seen. IMPRESSION: 1. Marked improvement in interstitial opacities particularly throughout the right lung. 2. Stable cardiomegaly with mild basilar volume loss. Electronically Signed   By: Ivar Drape M.D.   On: 10/08/2015 08:15   Dg Chest Port 1 View  10/06/2015  CLINICAL DATA:  CHF, STEMI, cardiogenic shock. EXAM: PORTABLE CHEST 1 VIEW COMPARISON:  Portable chest x-ray of October 05, 2015 FINDINGS: The lungs are adequately inflated. The pulmonary interstitial markings remain increased. The pulmonary vascularity remains engorged. The cardiac silhouette remains enlarged. There is no pleural effusion or pneumothorax.  The right-sided PICC line tip projects over the distal third of the SVC. IMPRESSION: Persistent pulmonary interstitial edema little changed from yesterday's study. Cardiomegaly with pulmonary vascular congestion, stable. Electronically Signed   By: David  Martinique M.D.   On: 10/06/2015 07:27   Dg Chest Port 1 View  10/05/2015  CLINICAL DATA:  New PICC Line EXAM: PORTABLE CHEST 1 VIEW COMPARISON:  10/05/2015 FINDINGS: Interval placement RIGHT PICC line with tip in distal SVC. Stable cardiac silhouette. Mild interstitial/pulmonary edema similar. No pneumothorax. IMPRESSION: 1. RIGHT PICC line in good position. 2. Mild interstitial / pulmonary edema. Electronically Signed   By: Suzy Bouchard M.D.   On: 10/05/2015 10:01   Dg Chest Port 1 View  10/05/2015  CLINICAL DATA:  CHF, STEMI, cardiogenic shock. EXAM: PORTABLE CHEST 1 VIEW COMPARISON:  Portable chest x-ray dated October 04, 2015 FINDINGS: The lungs are well-expanded. The pulmonary interstitial markings are less conspicuous today. The pulmonary vascularity is more distinct and less engorged. The cardiac silhouette remains enlarged. There is no significant pleural effusion. The retrocardiac region on the left is more dense however. There is calcification in the wall of the ascending aorta and aortic arch. IMPRESSION: Improved appearance of the pulmonary interstitium and pulmonary vascularity consistent with decreased CHF. Developing left lower lobe atelectasis. There is no pleural effusion or pneumothorax. Electronically Signed   By: David  Martinique M.D.   On: 10/05/2015 07:32   Portable Chest X-ray 1 View  10/04/2015  CLINICAL DATA:  Acute onset of shortness of breath. Initial encounter. EXAM: PORTABLE CHEST 1 VIEW COMPARISON:  Chest radiograph performed 10/03/2015 FINDINGS: The lungs are well-aerated. Vascular congestion is noted. Increased interstitial markings raise concern for pulmonary edema. There is no evidence of pleural effusion or pneumothorax.  The cardiomediastinal silhouette is mildly enlarged. No acute osseous abnormalities are seen. An intra-aortic balloon pump tip is noted 2 cm below the aortic knob, as expected. IMPRESSION:  Vascular congestion and mild cardiomegaly. Increased interstitial markings raise concern for pulmonary edema, new from the prior study. Electronically Signed   By: Garald Balding M.D.   On: 10/04/2015 00:44    Assessment/Plan  Physical deconditioning Post complicated hospital course. Will have him work with physical therapy and occupational therapy team to help with gait training and muscle strengthening exercises.fall precautions. Skin care. Encourage to be out of bed.   STEMI S/p left heart cath and had DES. Continue aspirin 81 mg daily. Continue coreg 3.125 mg bid and lisinopril 2.5 mg daily with statin and prn NTG. Has f/u with cardiology. Continue brilinta  Right Groin abscess S/p I&D and has completed antibiotic treatment. Afebrile. Continue wound care check cbc with diff.  Kidney  Leukocytosis Monitor cbc with diff  Systolic CHF Stable, continue coreg, lisinopril and lasix. Check bmp and weight. Check bp  Anemia with blood loss Post op, check cbc  Mobitz type 2 block S/p pacemaker placement. Currently asymptomatic. Monitor clinically  HLD Continue lipitor 80 mg daily  afib Rate controlled. Continue coreg 3.125 mg bid and digoxin 0.0625 mg daily  ckd stage 3 Monitor bmp  Has foley catheter in place with no clear indication. Remove foley catheter for now and if patient unable to void, assess further.   Goals of care: short term rehabilitation   Labs/tests ordered: cbc, cmp 10/22/15  Family/ staff Communication: reviewed care plan with patient and nursing supervisor    Blanchie Serve, MD Internal Medicine [place], [place] [address] Cell Phone (Monday-Friday 8 am - 5 pm): [phone] On Call: [phone] and follow  prompts after 5 pm and on weekends Office Phone: [phone] Office Fax: [phone]

## 2015-10-22 ENCOUNTER — Telehealth (HOSPITAL_COMMUNITY): Payer: Self-pay | Admitting: Vascular Surgery

## 2015-10-22 NOTE — Telephone Encounter (Signed)
Pt cancel appt via automated service , left message to reschedule pt appt

## 2015-10-26 ENCOUNTER — Encounter: Payer: Self-pay | Admitting: Adult Health

## 2015-10-26 ENCOUNTER — Inpatient Hospital Stay (HOSPITAL_COMMUNITY): Payer: Medicare Other

## 2015-10-26 ENCOUNTER — Non-Acute Institutional Stay (SKILLED_NURSING_FACILITY): Payer: Medicare Other | Admitting: Adult Health

## 2015-10-26 ENCOUNTER — Ambulatory Visit (HOSPITAL_COMMUNITY)
Admission: RE | Admit: 2015-10-26 | Discharge: 2015-10-26 | Disposition: A | Discharge status: Home / Self Care | Payer: No Typology Code available for payment source | Source: Ambulatory Visit | Attending: Cardiology | Admitting: Cardiology

## 2015-10-26 ENCOUNTER — Encounter (HOSPITAL_COMMUNITY): Payer: Self-pay

## 2015-10-26 ENCOUNTER — Telehealth (HOSPITAL_COMMUNITY): Payer: Self-pay | Admitting: *Deleted

## 2015-10-26 VITALS — BP 112/64 | HR 91 | Wt 155.0 lb

## 2015-10-26 DIAGNOSIS — I2121 ST elevation (STEMI) myocardial infarction involving left circumflex coronary artery: Secondary | ICD-10-CM

## 2015-10-26 DIAGNOSIS — Z7982 Long term (current) use of aspirin: Secondary | ICD-10-CM | POA: Insufficient documentation

## 2015-10-26 DIAGNOSIS — I130 Hypertensive heart and chronic kidney disease with heart failure and stage 1 through stage 4 chronic kidney disease, or unspecified chronic kidney disease: Secondary | ICD-10-CM | POA: Diagnosis not present

## 2015-10-26 DIAGNOSIS — Z8249 Family history of ischemic heart disease and other diseases of the circulatory system: Secondary | ICD-10-CM | POA: Insufficient documentation

## 2015-10-26 DIAGNOSIS — I5021 Acute systolic (congestive) heart failure: Secondary | ICD-10-CM

## 2015-10-26 DIAGNOSIS — I5022 Chronic systolic (congestive) heart failure: Secondary | ICD-10-CM | POA: Insufficient documentation

## 2015-10-26 DIAGNOSIS — E1122 Type 2 diabetes mellitus with diabetic chronic kidney disease: Secondary | ICD-10-CM | POA: Diagnosis not present

## 2015-10-26 DIAGNOSIS — R0600 Dyspnea, unspecified: Secondary | ICD-10-CM

## 2015-10-26 DIAGNOSIS — E785 Hyperlipidemia, unspecified: Secondary | ICD-10-CM | POA: Diagnosis not present

## 2015-10-26 DIAGNOSIS — Z950 Presence of cardiac pacemaker: Secondary | ICD-10-CM | POA: Diagnosis not present

## 2015-10-26 DIAGNOSIS — Z7902 Long term (current) use of antithrombotics/antiplatelets: Secondary | ICD-10-CM | POA: Diagnosis not present

## 2015-10-26 DIAGNOSIS — Z79899 Other long term (current) drug therapy: Secondary | ICD-10-CM | POA: Diagnosis not present

## 2015-10-26 DIAGNOSIS — I252 Old myocardial infarction: Secondary | ICD-10-CM | POA: Diagnosis not present

## 2015-10-26 DIAGNOSIS — R54 Age-related physical debility: Secondary | ICD-10-CM | POA: Diagnosis not present

## 2015-10-26 DIAGNOSIS — R0602 Shortness of breath: Secondary | ICD-10-CM | POA: Diagnosis not present

## 2015-10-26 DIAGNOSIS — I959 Hypotension, unspecified: Secondary | ICD-10-CM | POA: Diagnosis not present

## 2015-10-26 DIAGNOSIS — I2510 Atherosclerotic heart disease of native coronary artery without angina pectoris: Secondary | ICD-10-CM | POA: Insufficient documentation

## 2015-10-26 DIAGNOSIS — I255 Ischemic cardiomyopathy: Secondary | ICD-10-CM | POA: Diagnosis not present

## 2015-10-26 DIAGNOSIS — I480 Paroxysmal atrial fibrillation: Secondary | ICD-10-CM

## 2015-10-26 DIAGNOSIS — N183 Chronic kidney disease, stage 3 (moderate): Secondary | ICD-10-CM | POA: Diagnosis not present

## 2015-10-26 DIAGNOSIS — N179 Acute kidney failure, unspecified: Secondary | ICD-10-CM

## 2015-10-26 DIAGNOSIS — Z7189 Other specified counseling: Secondary | ICD-10-CM

## 2015-10-26 LAB — BASIC METABOLIC PANEL
Anion gap: 9 (ref 5–15)
BUN: 65 mg/dL — AB (ref 6–20)
CHLORIDE: 112 mmol/L — AB (ref 101–111)
CO2: 23 mmol/L (ref 22–32)
Calcium: 8.8 mg/dL — ABNORMAL LOW (ref 8.9–10.3)
Creatinine, Ser: 3.36 mg/dL — ABNORMAL HIGH (ref 0.61–1.24)
GFR calc Af Amer: 17 mL/min — ABNORMAL LOW (ref >60)
GFR calc non Af Amer: 15 mL/min — ABNORMAL LOW (ref >60)
GLUCOSE: 161 mg/dL — AB (ref 65–99)
POTASSIUM: 5.8 mmol/L — AB (ref 3.5–5.1)
Sodium: 144 mmol/L (ref 135–145)

## 2015-10-26 LAB — DIGOXIN LEVEL: Digoxin Level: 1.3 ng/mL (ref 0.8–2.0)

## 2015-10-26 MED ORDER — FUROSEMIDE 40 MG PO TABS: 40.0000 mg | ORAL_TABLET | Freq: Every day | ORAL | Status: DC

## 2015-10-26 NOTE — Telephone Encounter (Signed)
-----   Message from Larey Dresser, MD sent at 10/26/2015  2:30 PM EST ----- Will need to call Chad French and daughter today.  He will need to stop Lasix, stop digoxin, stop lisinopril.  Needs repeat BMET in 2 days.  He wanted to be DNR, would make sure that this gets done at his facility.

## 2015-10-26 NOTE — Patient Instructions (Addendum)
  Labs today  Chest X-Ray today  Your physician recommends that you schedule a follow-up appointment in: 10 days

## 2015-10-26 NOTE — Progress Notes (Signed)
Patient ID: Chad French, male   DOB: 1922/02/09, 82 y.o.   MRN: [id number] PCP: Dr. Delfina Redwood  82 yo with recent lateral MI complicated by cardiogenic shock, ischemic cardiomyopathy, paroxysmal atrial fibrillation, and high grade heart block s/p St Jude PPM presents for CHF clinic followup.  Prior to his 2/17 admission, he was thought to have had a silent MI.  Echo showed EF 45-50% with apical aneurysm.  He was admitted in 2/17 with lateral MI complicated by cardiogenic shock.  He had DES to LCx.  Also had CTO of LAD and moderate RCA disease.  He had IABP and ultimately had to start on milrinone.  Able to remove IABP eventually and titrate off milrinone.  Course complicated by paroxysmal atrial fibrillation and delirium.  He had symptomatic bradycardia from high grade heart block and had PPM placed. After a prolonged hospital course, he was discharged to [facility].  He was quite weak.   Patient is seen in followup today.  He remains very weak.  Oxygen was turned up to 4 L/[place].  This may have been because of his chronic Cheyne-Stokes type breathing pattern.  He is fatigued.  Doing some PT but mostly in wheelchair.  No cough or fever.  Still some confusion per daughter who accompanies him.  Has not been back for PPM check yet.  No chest pain.    CXR: Decreasing pulmonary edema compared to prior.   ECG: NSR, right axis deviation, nonspecific T wave changes  Labs (2/17): K 4.7, creatinine 1.32, digoxin 1.0 Labs (3/17): K 5.8, creatinine 3.36, digoxin 1.3  PMH: 1. HTN 2. Hyperlipidemia 3. Low back pain 4. Type II diabetes 5. CKD stage III.  6. CAD:  - Prior silent MI with apical aneurysm, EF 45-50% prior to 2/17 admission. - Lateral STEMI in 2/17, culprit LCx 99% stenosis but also with CTO LAD, 75% pRCA.  Had DES to LCx/OM.  Required IABP post-procedure.  7. Chronic systolic CHF: Ischemic cardiomyopathy.  - Echo (2/17) with EF 20-25%, apical aneurysm.  - Cardiogenic shock with 2/17  admission, had IABP and was on milrinone.  8. Atrial fibrillation: paroxysmal. 9. High grade heart block: s/p St Jude PPM 2/17.  10. Right groin abscess after IABP placement in 2/17.   SH: Married, daughter involved in care.  Nonsmoker.  Now living at [facility]   FH: CAD.  ROS: All systems reviewed and negative except as per HPI.   Current Outpatient Prescriptions  Medication Sig Dispense Refill  . acetaminophen (TYLENOL) 325 MG tablet Take 2 tablets (650 mg total) by mouth every 6 (six) hours as needed for mild pain or headache. 100 tablet 6  . aspirin EC 81 MG EC tablet Take 1 tablet (81 mg total) by mouth daily. 30 tablet 6  . atorvastatin (LIPITOR) 80 MG tablet Take 1 tablet (80 mg total) by mouth daily at 6 PM. 30 tablet 6  . carvedilol (COREG) 3.125 MG tablet Take 1 tablet (3.125 mg total) by mouth 2 (two) times daily with a meal. 60 tablet 6  . cholecalciferol (VITAMIN D) 400 UNITS TABS tablet Take 400 Units by mouth daily.    . digoxin 62.5 MCG TABS Take 0.0625 mg by mouth daily. 30 tablet 6  . furosemide (LASIX) 40 MG tablet Take 1 tablet (40 mg total) by mouth daily.    Marland Kitchen lisinopril (PRINIVIL,ZESTRIL) 2.5 MG tablet Take 1 tablet (2.5 mg total) by mouth daily. 30 tablet 6  . neomycin-bacitracin-polymyxin (NEOSPORIN) OINT Apply 1  application topically daily. 1 Tube 0  . nitroGLYCERIN (NITROSTAT) 0.4 MG SL tablet Place 1 tablet (0.4 mg total) under the tongue every 5 (five) minutes x 3 doses as needed for chest pain. 25 tablet 12  . ticagrelor (BRILINTA) 90 MG TABS tablet Take 1 tablet (90 mg total) by mouth 2 (two) times daily. 60 tablet 6   No current facility-administered medications for this encounter.   BP 112/64 mmHg  Pulse 91  Wt 155 lb (70.308 kg)  SpO2 96% General: NAD, frail Neck: No JVD, no thyromegaly or thyroid nodule.  Lungs: Crackles at bases bilaterally.  CV: Lateral PMI.  Heart regular S1/S2, no S3/S4, no murmur.  No peripheral edema.  No carotid bruit.   Normal pedal pulses.  Abdomen: Soft, nontender, no hepatosplenomegaly, no distention.  Skin: Intact without lesions or rashes.  Neurologic: Alert and oriented x 3.  Psych: Normal affect. Extremities: No clubbing or cyanosis.  Skin: Mild swelling at [facility] pocket site.  HEENT: Normal.   Assessment/Plan: 1. AKI: In setting of CKD stage III.  Creatinine up to 3.36 today.  Will stop Lasix, digoxin, and lisinopril (K is high at 5.8).  Need BMET in 2 days repeated.  2. Chronic systolic CHF: Ischemic cardiomyopathy.  EF 20-25% s/p recent MI.  He has crackles on lung exam and appears to breath heavily, but CXR shows improved pulmonary edema pattern and he is not significantly volume overloaded on exam.  BP stable today.  - As above, stop digoxin, lisinopril, and Lasix will AKI.  - Will need to follow creatinine and volume status carefully.  - Continue Coreg 3.125 mg bid.  Return to clinic in 7-10 days.  3. Atrial fibrillation: Paroxysmal.  He is in NSR today.  He is on ASA 81 and Brilinta for recent PCI.  Given fall risk, he has not been anticoagulated.  He was on amiodarone in the hospital but this has been stopped.  4. CAD: S/p lateral STEMI with DES to LCx system.  Also with CTO LAD (probably old).   - Continue ASA 81 + Brilinta. - Continue atorvastatin.  5. Poor overall prognosis, very frail.  We discussed code status today, he reaffirms that he wants to be DNR, daughter is supportive of this.  Would like to avoid hospitalizations if possible.  At [facility], so cannot be followed by hospice until he goes home.  We do not have DNR form today, will see if this can be done at his facility.   Loralie Champagne 10/26/2015 4:15 PM

## 2015-10-26 NOTE — Progress Notes (Signed)
Advanced Heart Failure Medication Review by a Pharmacist  Does the patient  feel that his/her medications are working for him/her?  yes  Has the patient been experiencing any side effects to the medications prescribed?  no  Does the patient measure his/her own blood pressure or blood glucose at home?  In SNF   Does the patient have any problems obtaining medications due to transportation or finances?   no  Understanding of regimen: SNF pt Understanding of indications: SNF pt Potential of compliance: SNF pt Patient understands to avoid NSAIDs. Patient understands to avoid decongestants.  Issues to address at subsequent visits: None  Pharmacist comments: 82 yo pleasant male reports to clinic with his daughter.  Pt currently resides in a SNF and medication MAR from SNF was reviewed.  Pt and daughter report no medication related issues or SE.  Per medication record lasix and lisinopril held on 3/4 for one dose.     Time with patient: 10 min Preparation and documentation time: 5 min Total time: 15 min

## 2015-10-26 NOTE — Addendum Note (Signed)
Encounter addended by: Larey Dresser, MD on: 10/26/2015  4:18 PM<BR>     Documentation filed: Patient Instructions Section

## 2015-10-26 NOTE — Progress Notes (Signed)
Patient ID: Chad French, male   DOB: [birth year], 82 y.o.   MRN: [id number]    DATE:  10/26/2015   MRN:  [id number]  BIRTHDAY: Sep 15, 1921  Facility:  Nursing Home Location:  [place] Room Number: 605-P  LEVEL OF CARE:  SNF (31)  Contact Information    Name Relation Home Work Mobile   Adams,Susan Daughter [phone]  [phone]   Jaylynn, Holme Spouse [phone]     Hague,Gail Daughter [phone]  [phone]       Code Status History    Date Active Date Inactive Code Status Order ID Comments User Context   10/16/2015  5:33 PM 10/20/2015  2:58 PM DNR [id number]  [name], MD Inpatient   10/04/2015 12:14 AM 10/16/2015  5:33 PM Full Code [id number]  Nelva Bush, MD Inpatient   10/03/2015  8:53 PM 10/04/2015 12:14 AM DNR [id number]  Leo Grosser, MD ED   12/14/2013  9:41 PM 10/03/2015  8:53 PM Full Code [id number]  Hennie Duos, MD Outpatient   11/27/2013  5:52 PM 12/02/2013  3:39 PM Full Code [id number]  Bonnielee Haff, MD ED    Questions for Most Recent Historical Code Status (Order [id number])    Question Answer Comment   In the event of cardiac or respiratory ARREST Do not call a "code blue"    In the event of cardiac or respiratory ARREST Do not perform Intubation, CPR, defibrillation or ACLS    In the event of cardiac or respiratory ARREST Use medication by any route, position, wound care, and other measures to relive pain and suffering. May use oxygen, suction and manual treatment of airway obstruction as needed for comfort.     Advance Directive Documentation        Most Recent Value   Type of Advance Directive  Out of facility DNR (pink MOST or yellow form)   Pre-existing out of facility DNR order (yellow form or pink MOST form)     "MOST" Form in Place?         Chief Complaint  Patient presents with  . Acute Visit    Declining condition, Hypotension and SOB    HISTORY OF PRESENT ILLNESS:  His is a  82 year old male who has been noted to have a change in condition - declining. O2 sat has been noted to be in the high 80s to low 90s. Noted that he has been using his abdominal muscles to breath. His BPs has been low - 99/57, 99/45, 88/50. He is verbally responsive and alert.  He had just come back from cardiology visit. Talked to daughter regarding patient's condition. She requested that her father not be transferred to hospital. She has requested for Palliative care.  He has been admitted to [facility] on 10/20/15 from [facility]  with lateral MI complicated by cardiogenic shock. He had DES to LCx. Also had CTO of LAD and moderate RCA disease. He had IABP and ultimately had to start on milrinone. Able to remove IABP eventually and titrate off milrinone. Course complicated by paroxysmal atrial fibrillation and delirium. He had symptomatic bradycardia from high grade heart block and had PPM placed.  He has been admitted for a short-term rehabilitation.  PAST MEDICAL HISTORY:  Past Medical History  Diagnosis Date  . Diverticulitis     with colonoscopy in 11/2010-Diverticula and polyp whioch was a non-malignant tubuular adenoma  . HTN (hypertension)   . HLD (hyperlipidemia)   . Degenerative disc disease   .  Diabetes mellitus ([place])   . Glaucoma      CURRENT MEDICATIONS: Reviewed  Patient's Medications  New Prescriptions   No medications on file  Previous Medications   ACETAMINOPHEN (TYLENOL) 325 MG TABLET    Take 2 tablets (650 mg total) by mouth every 6 (six) hours as needed for mild pain or headache.   AMIODARONE (PACERONE) 200 MG TABLET    Take 200 mg by mouth daily.   ASPIRIN EC 81 MG EC TABLET    Take 1 tablet (81 mg total) by mouth daily.   ATORVASTATIN (LIPITOR) 80 MG TABLET    Take 1 tablet (80 mg total) by mouth daily at 6 PM.   CARVEDILOL (COREG) 3.125 MG TABLET    Take 1 tablet (3.125 mg total) by mouth 2 (two) times daily with a meal.   CHOLECALCIFEROL (VITAMIN  D) 400 UNITS TABS TABLET    Take 400 Units by mouth daily.   NITROGLYCERIN (NITROSTAT) 0.4 MG SL TABLET    Place 1 tablet (0.4 mg total) under the tongue every 5 (five) minutes x 3 doses as needed for chest pain.   TICAGRELOR (BRILINTA) 90 MG TABS TABLET    Take 1 tablet (90 mg total) by mouth 2 (two) times daily.  Modified Medications   No medications on file  Discontinued Medications   CHOLECALCIFEROL (VITAMIN D) 400 UNITS TABS TABLET    Take 400 Units by mouth daily.   NEOMYCIN-BACITRACIN-POLYMYXIN (NEOSPORIN) OINT    Apply 1 application topically daily.     No Known Allergies   REVIEW OF SYSTEMS:  GENERAL: no change in appetite, no fatigue, no fever, chills or weakness SKIN: Denies rash, itching, ulcer sores, or nail abnormality EYES: Denies change in vision, dry eyes, eye pain, itching or discharge EARS: Denies change in hearing, ringing in ears, or earache NOSE: Denies nasal congestion or epistaxis MOUTH and THROAT: Denies oral discomfort, gingival pain or bleeding, pain from teeth or hoarseness   RESPIRATORY: no cough, DOE, wheezing, hemoptysis, +SOB CARDIAC: no chest pain, edema or palpitations GI: no abdominal pain, diarrhea, constipation, heart burn, nausea or vomiting GU: Denies dysuria, frequency, hematuria, incontinence, or discharge PSYCHIATRIC: Denies feeling of depression or anxiety. No report of hallucinations, insomnia, paranoia, or agitation   PHYSICAL EXAMINATION  GENERAL APPEARANCE: Well nourished. . Normal body habitus SKIN:  Skin is warm and dry.  HEAD: Normal in size and contour. No evidence of trauma EYES: Lids open and close normally. No blepharitis, entropion or ectropion. PERRL. Conjunctivae are clear and sclerae are white. Lenses are without opacity EARS: Pinnae are normal. Patient hears normal voice tunes of the examiner MOUTH and THROAT: Lips are without lesions. Oral mucosa is moist and without lesions. Tongue is normal in shape, size, and color and  without lesions NECK: supple, trachea midline, no neck masses, no thyroid tenderness, no thyromegaly LYMPHATICS: no LAN in the neck, no supraclavicular LAN RESPIRATORY: breathing is even & unlabored, BS CTAB CARDIAC: RRR, no murmur,no extra heart sounds, no edema GI: abdomen soft, normal BS, no masses, no tenderness, no hepatomegaly, no splenomegaly EXTREMITIES:  Able to move 4 extremities PSYCHIATRIC: Alert and oriented X 3. Affect and behavior are appropriate  LABS/RADIOLOGY: Labs reviewed: Basic Metabolic Panel:  Recent Labs  10/05/15 0217  10/19/15 1224 10/20/15 0633 10/26/15 1137  NA 141  < > 145 146* 144  K 4.0  < > 4.9 4.7 5.8*  CL 107  < > 110 111 112*  CO2 23  < >  26 24 23   GLUCOSE 127*  < > 129* 113* 161*  BUN 34*  < > 27* 30* 65*  CREATININE 1.46*  < > 1.35* 1.32* 3.36*  CALCIUM 8.5*  < > 8.8* 8.8* 8.8*  MG 2.0  --   --   --   --   < > = values in this interval not displayed. Liver Function Tests:  Recent Labs  10/03/15 1856  AST 359*  ALT 76*  ALKPHOS 64  BILITOT 1.0  PROT 6.8  ALBUMIN 3.9   CBC:  Recent Labs  10/03/15 1856  10/14/15 0400 10/15/15 0500 10/16/15 10/16/15 0415  WBC 16.8*  < > 12.6* 13.3* 11.1 11.1*  NEUTROABS 13.0*  --   --   --   --   --   HGB 14.8  < > 11.1* 10.0*  --  10.5*  HCT 45.2  < > 34.9* 31.5*  --  32.6*  MCV 98.9  < > 98.6 98.1  --  100.6*  PLT 170  < > 500* 465*  --  479*  < > = values in this interval not displayed. Lipid Panel:  Recent Labs  10/04/15 0509  HDL 39*   Cardiac Enzymes:  Recent Labs  10/04/15 0051 10/04/15 0509 10/04/15 1152  TROPONINI >65.00* >65.00* >65.00*    Recent Labs  10/19/15 2104 10/20/15 0631 10/20/15 1122  GLUCAP 113* 88 122*      Dg Chest 2 View  10/26/2015  CLINICAL DATA:  Shortness of breath. EXAM: CHEST  2 VIEW COMPARISON:  10/17/2015.  10/14/2015. FINDINGS: Mediastinum and hilar structures normal. Cardiomegaly. Cardiac pacer with lead tips in right atrium right  ventricle. Bilateral pulmonary interstitial prominence noted, improved from prior exam. Findings suggesting congestive heart failure with slight improvement. No prominent pleural effusion or pneumothorax IMPRESSION: Findings consist with partial clearing of congestive heart failure and pulmonary interstitial edema . Electronically Signed   By: Marcello Moores  Register   On: 10/26/2015 12:54   Dg Chest 2 View  10/17/2015  CLINICAL DATA:  Post pacemaker insertion EXAM: CHEST  2 VIEW COMPARISON:  10/14/2015 FINDINGS: Left pacer is in place with leads in the right atrium and right ventricle. No pneumothorax. There is cardiomegaly with vascular congestion. Aortic calcifications. No confluent airspace opacities, effusions or edema. No acute bony abnormality. IMPRESSION: Left pacer placement without pneumothorax. Cardiomegaly with vascular congestion. Electronically Signed   By: Rolm Baptise M.D.   On: 10/17/2015 09:13   Dg Chest 2 View  10/03/2015  CLINICAL DATA:  Fall today and generalized weakness EXAM: CHEST  2 VIEW COMPARISON:  11/28/2013 FINDINGS: Cardiac shadow is mildly enlarged but stable. Aortic calcifications are again seen. The lungs are well aerated bilaterally. Mild interstitial changes are noted within both lungs. No focal confluent infiltrate is seen. No acute bony abnormality is noted. IMPRESSION: No acute abnormality noted. Electronically Signed   By: Inez Catalina M.D.   On: 10/03/2015 18:59   Ct Head Wo Contrast  10/08/2015  CLINICAL DATA:  Confusion.  History of hypertension and diabetes. EXAM: CT HEAD WITHOUT CONTRAST TECHNIQUE: Contiguous axial images were obtained from the base of the skull through the vertex without intravenous contrast. COMPARISON:  None. FINDINGS: No evidence for acute infarction, hemorrhage, mass lesion, hydrocephalus, or extra-axial fluid. Advanced cerebral and cerebellar atrophy. Extensive hypoattenuation of white matter, likely chronic microvascular ischemic change. Calvarium  is intact. There is been previous ocular banding surgery. There is calcification of the skull base internal carotid arteries and distal  BILATERAL vertebral arteries. Minor chronic sinus disease. No mastoid fluid. IMPRESSION: Atrophy and small vessel disease.  No acute intracranial findings. Electronically Signed   By: Staci Righter M.D.   On: 10/08/2015 14:59   Dg Chest Port 1 View  10/14/2015  CLINICAL DATA:  Multiple episodes of productive cough and dyspnea EXAM: PORTABLE CHEST 1 VIEW COMPARISON:  10/08/2015 FINDINGS: Cardiomegaly again noted. Atherosclerotic calcifications of thoracic aorta. Right arm PICC line is unchanged in position. Persistent mild perihilar and mild infrahilar interstitial prominence without convincing pulmonary edema. No segmental infiltrates. No pleural effusion. IMPRESSION: Right arm PICC line is unchanged in position. Persistent mild perihilar and mild infrahilar interstitial prominence without convincing pulmonary edema. No segmental infiltrates. No pleural effusion. Electronically Signed   By: Lahoma Crocker M.D.   On: 10/14/2015 12:36   Dg Chest Port 1 View  10/08/2015  CLINICAL DATA:  Confusion EXAM: PORTABLE CHEST 1 VIEW COMPARISON:  Portable chest x-ray of 10/06/2015 FINDINGS: The previously noted interstitial opacities primarily throughout the right lung have cleared. There is mild basilar volume loss right greater than left. Cardiomegaly is stable. The right PICC line tip overlies the lower SVC. No pneumothorax is seen. IMPRESSION: 1. Marked improvement in interstitial opacities particularly throughout the right lung. 2. Stable cardiomegaly with mild basilar volume loss. Electronically Signed   By: Ivar Drape M.D.   On: 10/08/2015 08:15   Dg Chest Port 1 View  10/06/2015  CLINICAL DATA:  CHF, STEMI, cardiogenic shock. EXAM: PORTABLE CHEST 1 VIEW COMPARISON:  Portable chest x-ray of October 05, 2015 FINDINGS: The lungs are adequately inflated. The pulmonary interstitial  markings remain increased. The pulmonary vascularity remains engorged. The cardiac silhouette remains enlarged. There is no pleural effusion or pneumothorax. The right-sided PICC line tip projects over the distal third of the SVC. IMPRESSION: Persistent pulmonary interstitial edema little changed from yesterday's study. Cardiomegaly with pulmonary vascular congestion, stable. Electronically Signed   By: David  Martinique M.D.   On: 10/06/2015 07:27   Dg Chest Port 1 View  10/05/2015  CLINICAL DATA:  New PICC Line EXAM: PORTABLE CHEST 1 VIEW COMPARISON:  10/05/2015 FINDINGS: Interval placement RIGHT PICC line with tip in distal SVC. Stable cardiac silhouette. Mild interstitial/pulmonary edema similar. No pneumothorax. IMPRESSION: 1. RIGHT PICC line in good position. 2. Mild interstitial / pulmonary edema. Electronically Signed   By: Suzy Bouchard M.D.   On: 10/05/2015 10:01   Dg Chest Port 1 View  10/05/2015  CLINICAL DATA:  CHF, STEMI, cardiogenic shock. EXAM: PORTABLE CHEST 1 VIEW COMPARISON:  Portable chest x-ray dated October 04, 2015 FINDINGS: The lungs are well-expanded. The pulmonary interstitial markings are less conspicuous today. The pulmonary vascularity is more distinct and less engorged. The cardiac silhouette remains enlarged. There is no significant pleural effusion. The retrocardiac region on the left is more dense however. There is calcification in the wall of the ascending aorta and aortic arch. IMPRESSION: Improved appearance of the pulmonary interstitium and pulmonary vascularity consistent with decreased CHF. Developing left lower lobe atelectasis. There is no pleural effusion or pneumothorax. Electronically Signed   By: David  Martinique M.D.   On: 10/05/2015 07:32   Portable Chest X-ray 1 View  10/04/2015  CLINICAL DATA:  Acute onset of shortness of breath. Initial encounter. EXAM: PORTABLE CHEST 1 VIEW COMPARISON:  Chest radiograph performed 10/03/2015 FINDINGS: The lungs are  well-aerated. Vascular congestion is noted. Increased interstitial markings raise concern for pulmonary edema. There is no evidence of pleural effusion or pneumothorax.  The cardiomediastinal silhouette is mildly enlarged. No acute osseous abnormalities are seen. An intra-aortic balloon pump tip is noted 2 cm below the aortic knob, as expected. IMPRESSION: Vascular congestion and mild cardiomegaly. Increased interstitial markings raise concern for pulmonary edema, new from the prior study. Electronically Signed   By: Garald Balding M.D.   On: 10/04/2015 00:44    ASSESSMENT/PLAN:  Hypotension - cardiology ordered Lasix, Digoxin and Lisinopril to be discontinued; will monitor BP  SOB - daughter requested for patient not to be transferred to hospital and to have palliative consult; patient is DNR     [facility], NP [facility] [phone]

## 2015-10-26 NOTE — Telephone Encounter (Signed)
Notes Recorded by Scarlette Calico, RN on 10/26/2015 at 3:29 PM [facility] and spoke w/Shantae, RN who is caring for pt gave her VO and also faxed orders to her at [phone], she states pt is a DNR and will be having a Palliative Care consult. Also called pt's daughter, [name] and gave her results

## 2015-10-30 ENCOUNTER — Ambulatory Visit: Payer: Medicare Other

## 2015-11-03 ENCOUNTER — Encounter (HOSPITAL_COMMUNITY): Payer: Medicare Other

## 2015-11-21 DEATH — deceased

## 2017-03-28 IMAGING — CR DG CHEST 2V
2 series · 2 of 2 positions shown · non-contrast
Comparison: 11/28/2013

CLINICAL DATA: Fall today and generalized weakness

EXAM:
CHEST  2 VIEW

[w chest lat]
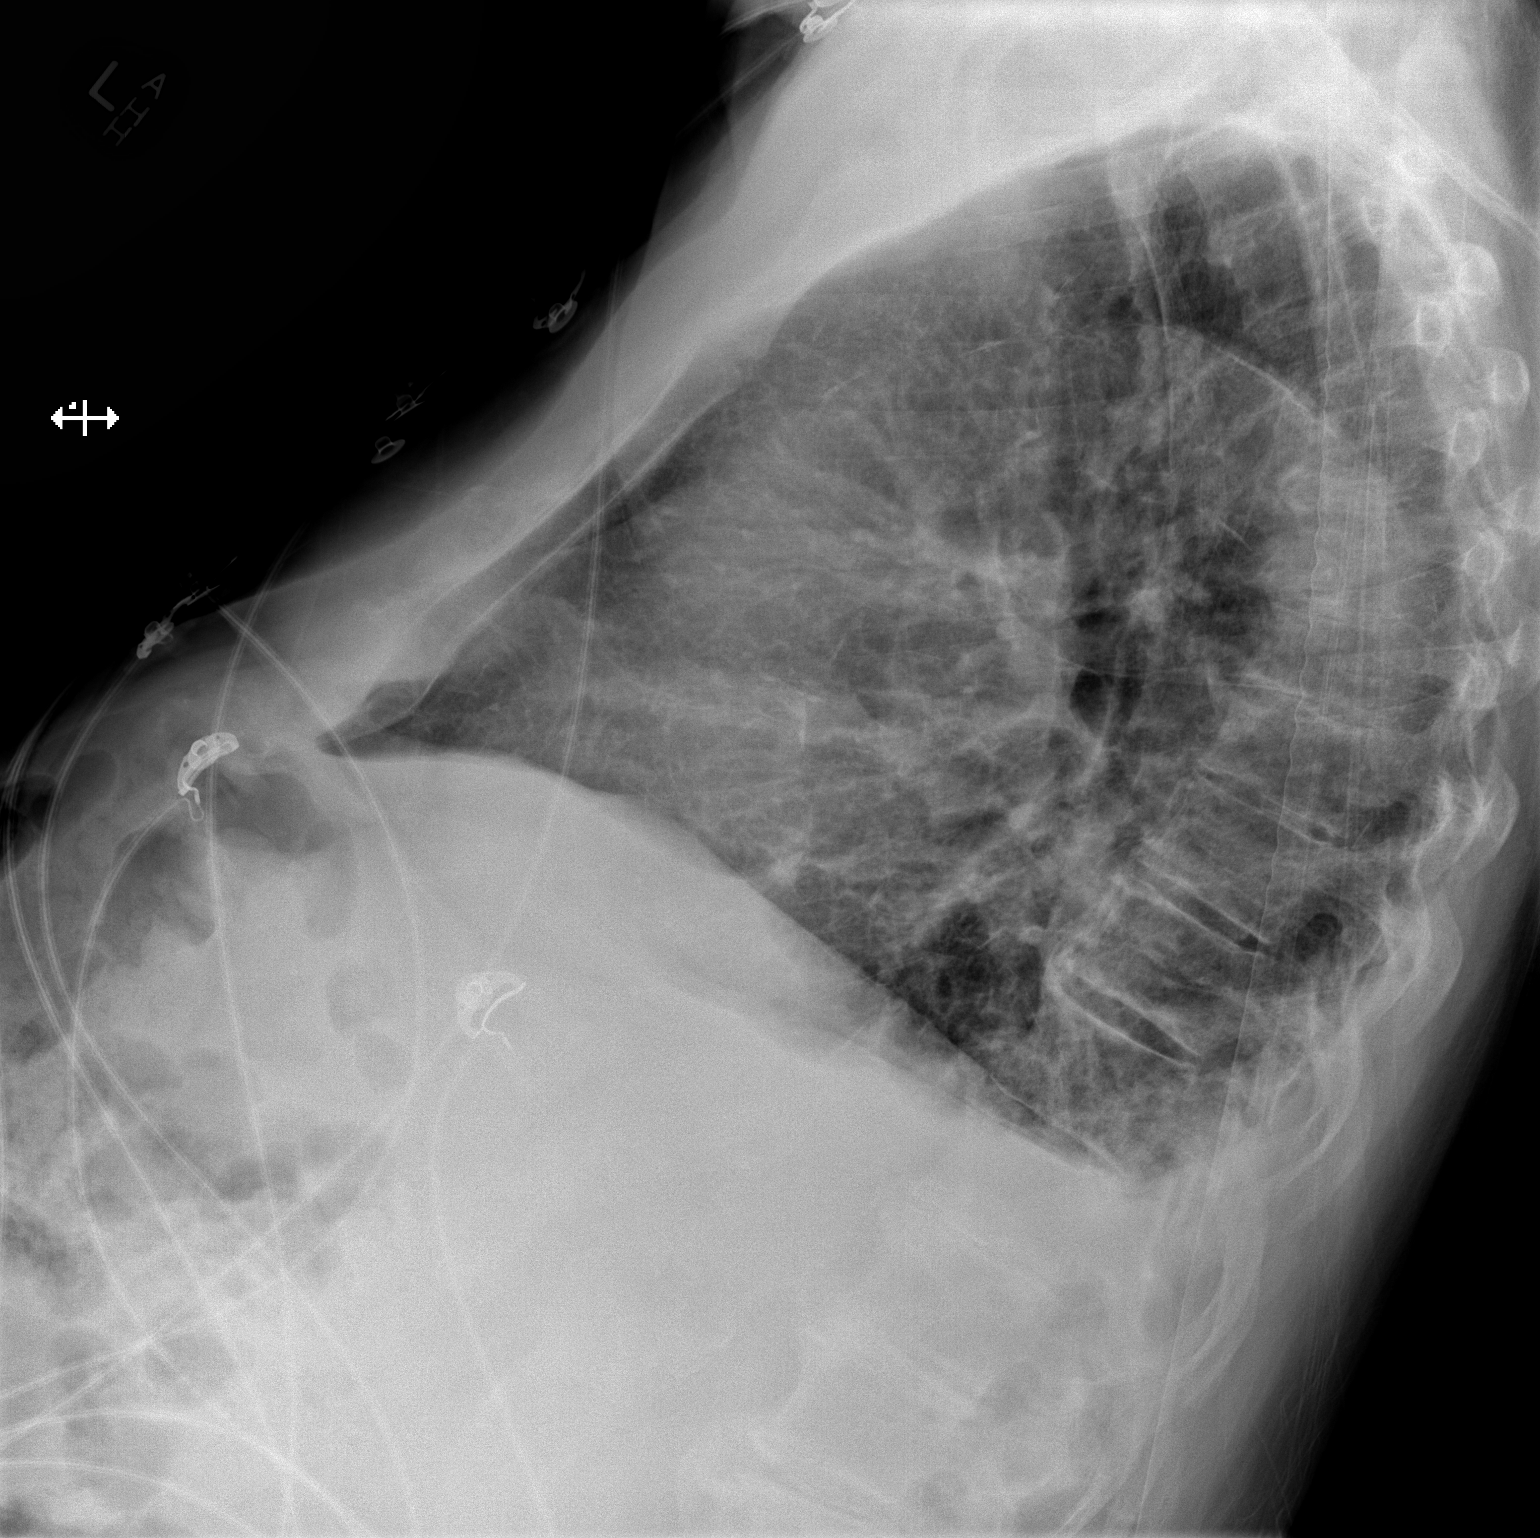

[x chest ap]
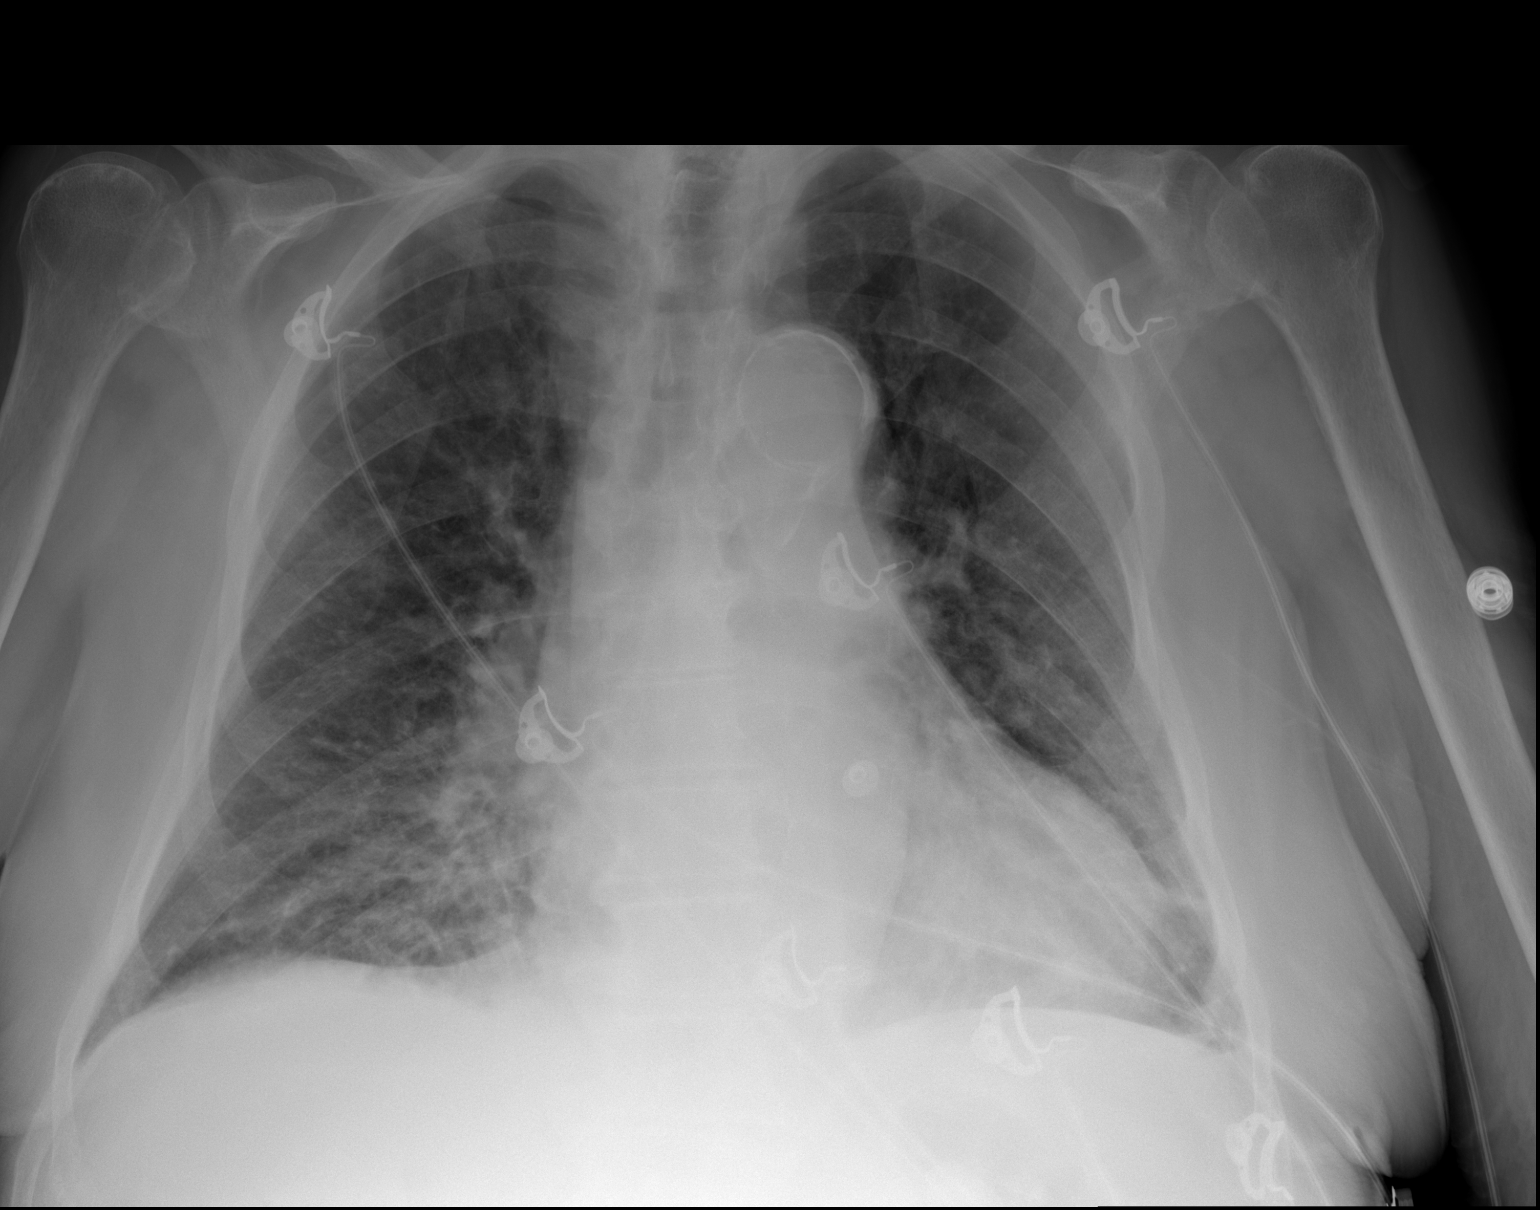

[2 of 2 positions shown; findings below may reference images not displayed]

FINDINGS: Cardiac shadow is mildly enlarged but stable. Aortic calcifications
are again seen. The lungs are well aerated bilaterally. Mild
interstitial changes are noted within both lungs. No focal confluent
infiltrate is seen. No acute bony abnormality is noted.
IMPRESSION: No acute abnormality noted.

## 2017-03-29 IMAGING — CR DG CHEST 1V PORT
1 series · 1 of 1 positions shown · non-contrast
Comparison: Chest radiograph performed 10/03/2015

CLINICAL DATA: Acute onset of shortness of breath. Initial
encounter.

EXAM:
PORTABLE CHEST 1 VIEW

[AP]
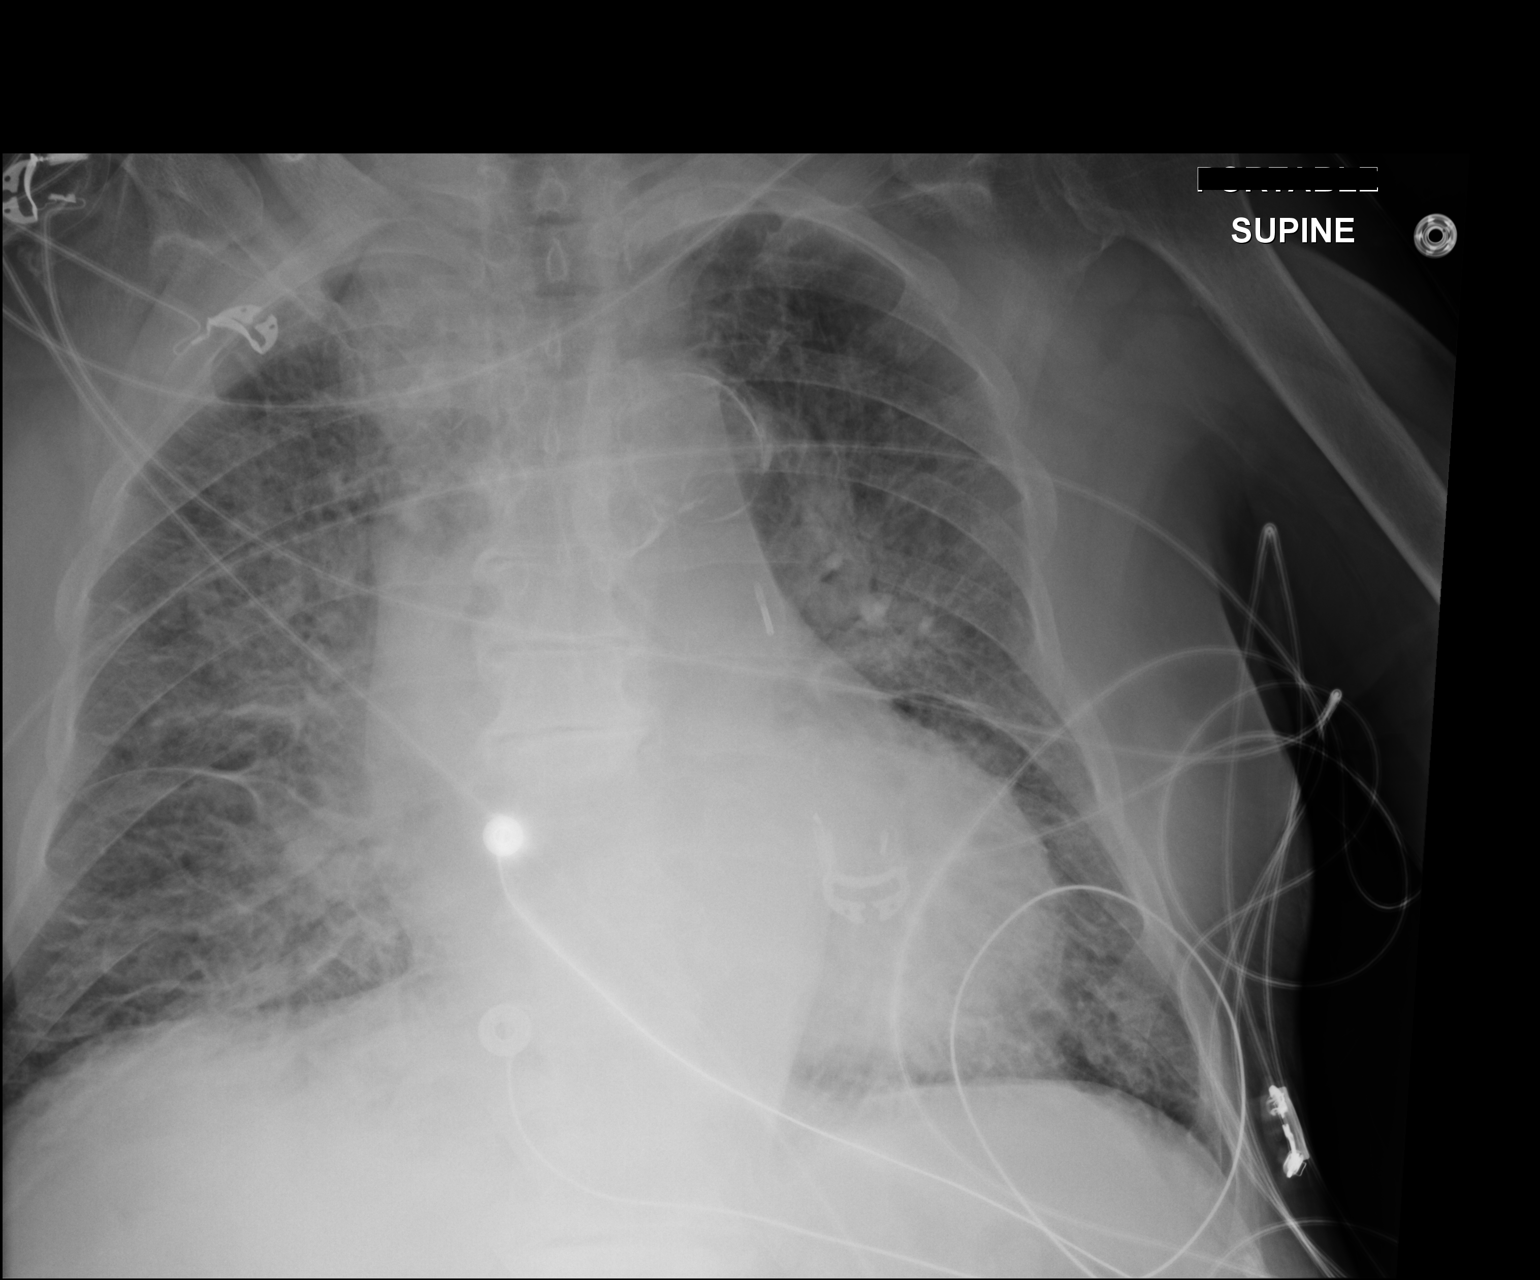

[1 of 1 positions shown; findings below may reference images not displayed]

FINDINGS: The lungs are well-aerated. Vascular congestion is noted. Increased
interstitial markings raise concern for pulmonary edema. There is no
evidence of pleural effusion or pneumothorax.

The cardiomediastinal silhouette is mildly enlarged. No acute
osseous abnormalities are seen.

An intra-aortic balloon pump tip is noted 2 cm below the aortic
knob, as expected.
IMPRESSION: Vascular congestion and mild cardiomegaly. Increased interstitial
markings raise concern for pulmonary edema, new from the prior
study.

## 2017-03-31 IMAGING — CR DG CHEST 1V PORT
1 series · 1 of 1 positions shown · non-contrast
Comparison: Portable chest x-ray October 05, 2015

CLINICAL DATA: CHF, STEMI, cardiogenic shock.

EXAM:
PORTABLE CHEST 1 VIEW

[AP]
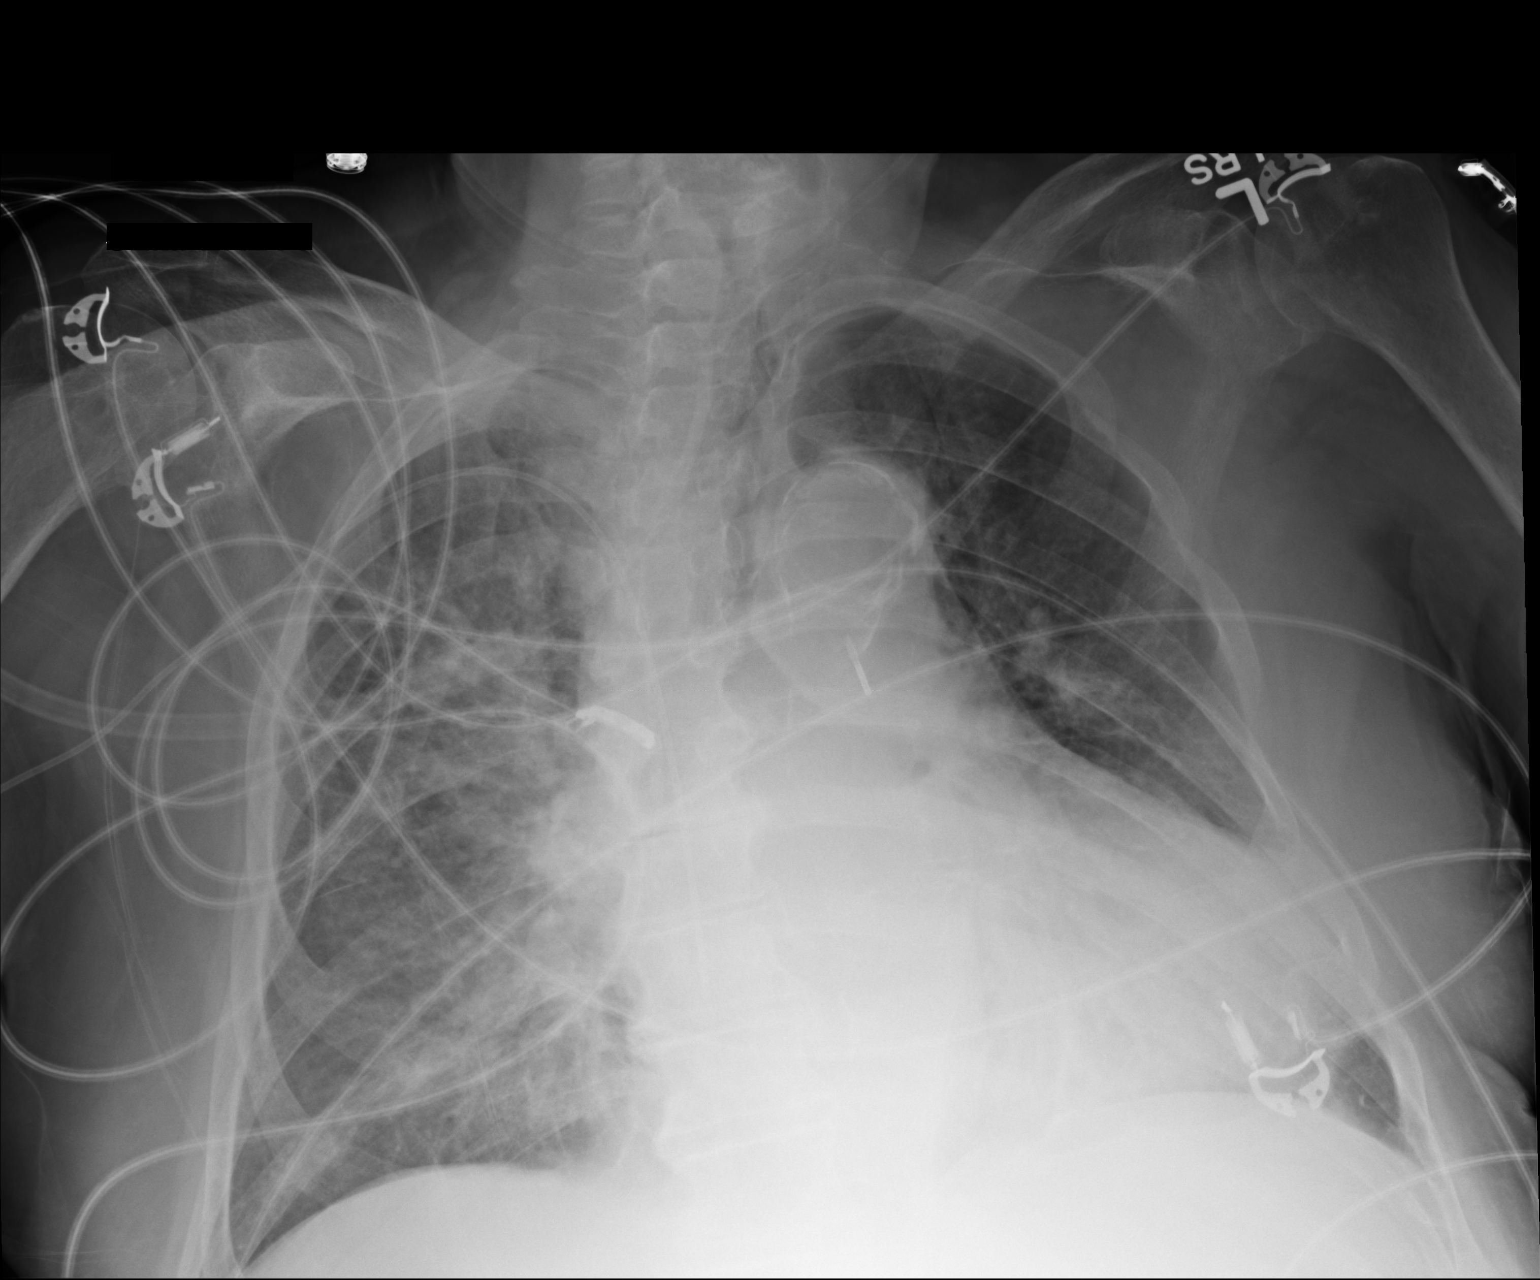

[1 of 1 positions shown; findings below may reference images not displayed]

FINDINGS: The lungs are adequately inflated. The pulmonary interstitial
markings remain increased. The pulmonary vascularity remains
engorged. The cardiac silhouette remains enlarged. There is no
pleural effusion or pneumothorax. The right-sided PICC line tip
projects over the distal third of the SVC.
IMPRESSION: Persistent pulmonary interstitial edema little changed from
yesterday's study. Cardiomegaly with pulmonary vascular congestion,
stable.

## 2017-04-08 IMAGING — CR DG CHEST 1V PORT
1 series · 1 of 1 positions shown · non-contrast
Comparison: 10/08/2015

CLINICAL DATA: Multiple episodes of productive cough and dyspnea

EXAM:
PORTABLE CHEST 1 VIEW

[AP]
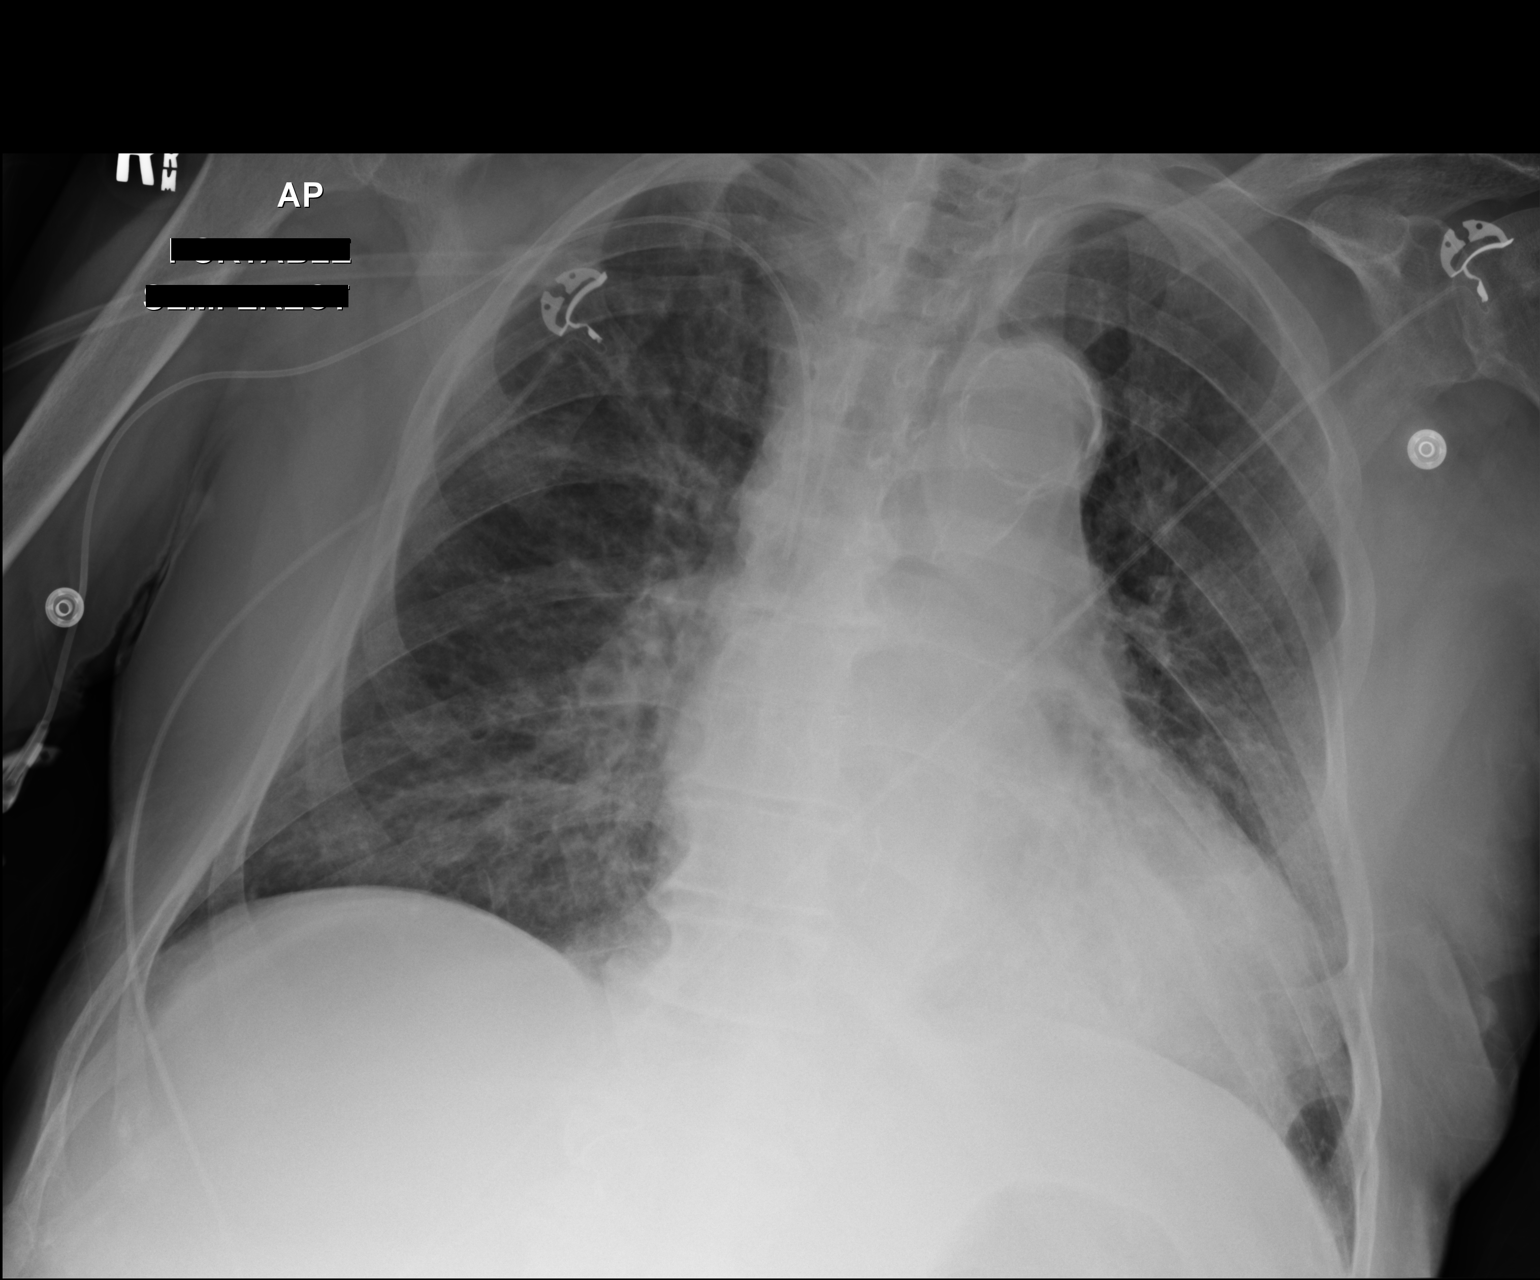

[1 of 1 positions shown; findings below may reference images not displayed]

FINDINGS: Cardiomegaly again noted. Atherosclerotic calcifications of thoracic
aorta. Right arm PICC line is unchanged in position. Persistent mild
perihilar and mild infrahilar interstitial prominence without
convincing pulmonary edema. No segmental infiltrates. No pleural
effusion.
IMPRESSION: Right arm PICC line is unchanged in position. Persistent mild
perihilar and mild infrahilar interstitial prominence without
convincing pulmonary edema. No segmental infiltrates. No pleural
effusion.
